# Patient Record
Sex: Female | Born: 1960 | Race: White | Hispanic: No | Marital: Married | Smoking: Current every day smoker
Health system: Southern US, Community
[De-identification: ages and names within clinical notes are randomized; demographics above are authoritative.]

## PROBLEM LIST (undated history)

## (undated) DIAGNOSIS — H8109 Meniere's disease, unspecified ear: Secondary | ICD-10-CM

## (undated) DIAGNOSIS — D735 Infarction of spleen: Secondary | ICD-10-CM

## (undated) DIAGNOSIS — I639 Cerebral infarction, unspecified: Secondary | ICD-10-CM

## (undated) DIAGNOSIS — C801 Malignant (primary) neoplasm, unspecified: Secondary | ICD-10-CM

## (undated) DIAGNOSIS — J439 Emphysema, unspecified: Secondary | ICD-10-CM

## (undated) DIAGNOSIS — R112 Nausea with vomiting, unspecified: Secondary | ICD-10-CM

## (undated) DIAGNOSIS — E785 Hyperlipidemia, unspecified: Principal | ICD-10-CM

## (undated) DIAGNOSIS — I10 Essential (primary) hypertension: Secondary | ICD-10-CM

## (undated) DIAGNOSIS — H409 Unspecified glaucoma: Secondary | ICD-10-CM

## (undated) DIAGNOSIS — Z9889 Other specified postprocedural states: Secondary | ICD-10-CM

## (undated) DIAGNOSIS — E079 Disorder of thyroid, unspecified: Secondary | ICD-10-CM

## (undated) HISTORY — DX: Emphysema, unspecified: J43.9

## (undated) HISTORY — DX: Malignant (primary) neoplasm, unspecified: C80.1

## (undated) HISTORY — DX: Unspecified glaucoma: H40.9

## (undated) HISTORY — DX: Hyperlipidemia, unspecified: E78.5

## (undated) HISTORY — DX: Infarction of spleen: D73.5

## (undated) HISTORY — DX: Disorder of thyroid, unspecified: E07.9

## (undated) HISTORY — DX: Cerebral infarction, unspecified: I63.9

## (undated) HISTORY — DX: Meniere's disease, unspecified ear: H81.09

---

## 1990-08-25 HISTORY — PX: CHOLECYSTECTOMY: SHX55

## 2014-08-25 DIAGNOSIS — C801 Malignant (primary) neoplasm, unspecified: Secondary | ICD-10-CM

## 2014-08-25 HISTORY — DX: Malignant (primary) neoplasm, unspecified: C80.1

## 2014-08-30 LAB — PROTIME-INR

## 2014-09-14 DIAGNOSIS — E669 Obesity, unspecified: Secondary | ICD-10-CM | POA: Insufficient documentation

## 2014-09-14 DIAGNOSIS — E278 Other specified disorders of adrenal gland: Secondary | ICD-10-CM | POA: Insufficient documentation

## 2014-10-10 DIAGNOSIS — R0683 Snoring: Secondary | ICD-10-CM | POA: Insufficient documentation

## 2014-10-10 DIAGNOSIS — I639 Cerebral infarction, unspecified: Secondary | ICD-10-CM | POA: Insufficient documentation

## 2014-10-10 DIAGNOSIS — H81319 Aural vertigo, unspecified ear: Secondary | ICD-10-CM | POA: Insufficient documentation

## 2014-10-10 DIAGNOSIS — D735 Infarction of spleen: Secondary | ICD-10-CM | POA: Insufficient documentation

## 2014-10-10 DIAGNOSIS — D72829 Elevated white blood cell count, unspecified: Secondary | ICD-10-CM | POA: Insufficient documentation

## 2014-10-10 DIAGNOSIS — E0590 Thyrotoxicosis, unspecified without thyrotoxic crisis or storm: Secondary | ICD-10-CM | POA: Insufficient documentation

## 2014-10-10 DIAGNOSIS — J449 Chronic obstructive pulmonary disease, unspecified: Secondary | ICD-10-CM | POA: Insufficient documentation

## 2014-10-27 DIAGNOSIS — R52 Pain, unspecified: Secondary | ICD-10-CM | POA: Insufficient documentation

## 2014-11-16 ENCOUNTER — Telehealth: Payer: Self-pay | Admitting: Hematology & Oncology

## 2014-11-16 ENCOUNTER — Encounter: Payer: Self-pay | Admitting: *Deleted

## 2014-11-16 ENCOUNTER — Encounter: Payer: Self-pay | Admitting: Hematology & Oncology

## 2014-11-16 NOTE — Telephone Encounter (Signed)
Pt aware of 3-28 appointment.She called wanting consult with Dr. Marin Olp she feels like [facility] is not sure how to treat her. I scanned some of the chart from care everywhere in this Epic.

## 2014-11-17 ENCOUNTER — Telehealth: Payer: Self-pay | Admitting: Hematology & Oncology

## 2014-11-17 NOTE — Telephone Encounter (Signed)
I spoke w NEW PATIENT today to remind them of their appointment with Dr. Ennever. Also, advised them to bring all medication bottles and insurance card information. ° °

## 2014-11-20 ENCOUNTER — Encounter: Payer: Self-pay | Admitting: Hematology & Oncology

## 2014-11-20 ENCOUNTER — Ambulatory Visit: Payer: Self-pay | Admitting: Hematology & Oncology

## 2014-11-20 ENCOUNTER — Ambulatory Visit: Payer: BLUE CROSS/BLUE SHIELD

## 2014-11-20 ENCOUNTER — Ambulatory Visit (HOSPITAL_BASED_OUTPATIENT_CLINIC_OR_DEPARTMENT_OTHER): Payer: BLUE CROSS/BLUE SHIELD | Admitting: Hematology & Oncology

## 2014-11-20 ENCOUNTER — Ambulatory Visit: Payer: Self-pay

## 2014-11-20 ENCOUNTER — Other Ambulatory Visit: Payer: Self-pay

## 2014-11-20 ENCOUNTER — Other Ambulatory Visit (HOSPITAL_BASED_OUTPATIENT_CLINIC_OR_DEPARTMENT_OTHER): Payer: BLUE CROSS/BLUE SHIELD

## 2014-11-20 VITALS — BP 135/52 | HR 101 | Temp 98.3°F | Resp 18 | Ht 62.0 in | Wt 168.0 lb

## 2014-11-20 DIAGNOSIS — C7492 Malignant neoplasm of unspecified part of left adrenal gland: Secondary | ICD-10-CM

## 2014-11-20 DIAGNOSIS — R634 Abnormal weight loss: Secondary | ICD-10-CM

## 2014-11-20 DIAGNOSIS — R109 Unspecified abdominal pain: Secondary | ICD-10-CM | POA: Diagnosis not present

## 2014-11-20 DIAGNOSIS — Z720 Tobacco use: Secondary | ICD-10-CM | POA: Diagnosis not present

## 2014-11-20 DIAGNOSIS — C7402 Malignant neoplasm of cortex of left adrenal gland: Secondary | ICD-10-CM

## 2014-11-20 LAB — CBC WITH DIFFERENTIAL (CANCER CENTER ONLY)
BASO#: 0.1 10*3/uL (ref 0.0–0.2)
BASO%: 0.4 % (ref 0.0–2.0)
EOS ABS: 0.8 10*3/uL — AB (ref 0.0–0.5)
EOS%: 2.7 % (ref 0.0–7.0)
HEMATOCRIT: 39.2 % (ref 34.8–46.6)
HEMOGLOBIN: 12.6 g/dL (ref 11.6–15.9)
LYMPH#: 4.4 10*3/uL — AB (ref 0.9–3.3)
LYMPH%: 14.3 % (ref 14.0–48.0)
MCH: 27.4 pg (ref 26.0–34.0)
MCHC: 32.1 g/dL (ref 32.0–36.0)
MCV: 85 fL (ref 81–101)
MONO#: 1.4 10*3/uL — ABNORMAL HIGH (ref 0.1–0.9)
MONO%: 4.6 % (ref 0.0–13.0)
NEUT#: 23.9 10*3/uL — ABNORMAL HIGH (ref 1.5–6.5)
NEUT%: 78 % (ref 39.6–80.0)
Platelets: 491 10*3/uL — ABNORMAL HIGH (ref 145–400)
RBC: 4.6 10*6/uL (ref 3.70–5.32)
RDW: 13.4 % (ref 11.1–15.7)
WBC: 30.7 10*3/uL — ABNORMAL HIGH (ref 3.9–10.0)

## 2014-11-20 NOTE — Progress Notes (Signed)
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 : I have a tumor on my adrenal gland and no one knows what to do about it.  HPI: Felicia Richmond is a very charming 54 year old white female. She is originally from [place]. She and her husband have been down in the [place] region for about 14 years.  She's been having worsening abdominal and left-sided back discomfort. She ultimately had scans done. Back in January, she had an MRI of the abdomen. This showed a left adrenal mass measuring 10 cm. There is also noted to be a "suspicious" right adrenal nodule measuring 1.3 cm. Everything else looked okay. There is no lymphadenopathy. Liver looked fine. Patient does smoke. She has had scans of her chest which should been negative.  She underwent a biopsy of the left adrenal mass. This was done on March 14. 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 Some stains were done. Unfortunately, the pathologist could not tell this was a primary adrenocortical carcinoma or possibly melanoma.  She does state that she had a mole taken off her right side several years ago. She does not recall if this was malignant.  She has seen oncology out at [facility]. They recommended surgery. Apparently, the surgeons were initially not sure of being able to resect out this lesion.  She has pain over on the left flank. She has some back discomfort. She is on tramadol. She cannot take anything stronger. She's had no flushing. She's had no blood pressure issues. She did have urine studies done for pheochromocytoma and these were negative.  She's had no fever. She has lost some weight. She's file all spelled 25 pounds over the past several months.  She's had no change in bowel or bladder habits. She's had no cough. She's had no shortness of breath.  She kindly came to see [place] to see  if we could help her out.  Overall, her performance status is ECOG 1.  She has never had a mammogram done.   History reviewed. No pertinent past medical history.:  History reviewed. No pertinent past surgical history.:   Current outpatient prescriptions:  .  acetaminophen (TYLENOL) 500 MG tablet, Take 500 mg by mouth every 6 (six) hours as needed., Disp: , Rfl:  .  aspirin EC 81 MG tablet, Take 81 mg by mouth., Disp: , Rfl:  .  Meclizine HCl 25 MG CHEW, Chew 25 mg by mouth., Disp: , Rfl:  .  methimazole (TAPAZOLE) 5 MG tablet, Take 5 mg by mouth., Disp: , Rfl:  .  prochlorperazine (COMPAZINE) 10 MG tablet, Take 10 mg by mouth., Disp: , Rfl:  .  traMADol (ULTRAM) 50 MG tablet, Take 50 mg by mouth., Disp: , Rfl: :  :  Allergies  Allergen Reactions  . Oxycodone Nausea And Vomiting  . Pseudoephedrine Other (See Comments)    Makes patient feel weird  . Scopolamine Other (See Comments)  . Tramadol     Other reaction(s): GI Upset (intolerance)  :  History reviewed. No pertinent family history.:  History   Social History  . Marital Status: Married    Spouse Name: N/A  . Number of Children: N/A  . Years of Education: N/A   Occupational History  . Not on file.   Social History Main Topics  . Smoking status: Current Every Day Smoker -- 1.00 packs/day for 36 years  Types: Cigarettes  . Smokeless tobacco: Never Used  . Alcohol Use: No  . Drug Use: No  . Sexual Activity: Yes    Birth Control/ Protection: None   Other Topics Concern  . Not on file   Social History Narrative  . No narrative on file  :  Pertinent items are noted in HPI.  Exam: _0 @ well-developed and well-nourished white female in no obvious distress. Vital signs show temperature of 98.3. Pulse 101. Blood pressure 135/52. Weight is 168 pounds. Head and neck exam shows no ocular or oral lesions. She has no palpable cervical or supraclavicular lymph nodes. Lungs are clear. No rales, wheezes or  rhonchi are noted. Cardiac exam regular rate and rhythm with no murmurs, rubs or bruits. Abdomen is soft. She has good bowel sounds. There is no fluid wave. There is no palpable abdominal mass. There is some slight fullness over on the left side of the abdomen. There is some slight tenderness to palpation on the left abdomen. There is no palpable hepatomegaly. I cannot palpate her spleen tip. Back exam shows no tenderness over the spine, ribs or hips. Extremities shows no clubbing, cyanosis or edema. Axillary exam shows no bilateral axillary adenopathy. Neurological exam shows no focal neurological deficits. Skin exam does show a slightly suspicious appearing hyperpigmented lesion in the upper back on the right side. This is slightly irregular. It is dark. It is flat. It probably measures about 4 x 3 mm.  Recent Labs  11/20/14 1039  WBC 30.7*  HGB 12.6  HCT 39.2  PLT 491*   No results for input(s): NA, K, CL, CO2, GLUCOSE, BUN, CREATININE, CALCIUM in the last 72 hours.  Blood smear review:  none  Pat none    Assessment : Felicia Richmond is a 54 year old white female with a large left adrenal mass. This was biopsied. Unfortunately, the pathologist could not tell this was metastatic or primary to the adrenal gland. I must say that primary adrenocortical carcinoma is very rare.  One would have to think that possibly melanoma would be more likely.  I cannot see where she's had any scans of her chest. With her smoking, a metastatic lung cancer would was be possible. However, I would think that one should be able to tell this on pathologic evaluation.  In my opinion, I think that the best option for her is to try to resect out this mass. I'm not sure what is going on with the right adrenal gland but whatever is there does not seem to be all that being. She certainly does not have any signs of adrenal insufficiency. This does not appear to be a pheochromocytoma by lab studies that she had done.  I think  by excising the left adrenal gland, there will be ample material for evaluation.  At this is melanoma, then the next chemotherapy be whether or not it is BRAF positive. If so, then I would consider treating her with one of the BRAF inhibitors.  If it is BRAF wild-type, then 1 in the immune therapies would be reasonable to consider.  I spent about 1 hour with she and her husband. I answered all their questions. She is in good shape so I think that aggressive therapy certainly would be reasonable for her. Again, I believe that surgical exploration and resection of this mass and possibly even the right adrenal lesion might be the way to go right now.  She really is in good hands with the doctors at [facility].  They are incredibly skilled and incredibly smart so I told Felicia Richmond that their recommendations would be very reasonable and again I think she already has an appointment to see a surgeon.  We will plan to get her back to see us at any time in the future.   

## 2014-11-21 LAB — COMPREHENSIVE METABOLIC PANEL
ALK PHOS: 104 U/L (ref 39–117)
ALT: <8 U/L (ref 0–35)
AST: 10 U/L (ref 0–37)
Albumin: 3.9 g/dL (ref 3.5–5.2)
BILIRUBIN TOTAL: 0.3 mg/dL (ref 0.2–1.2)
BUN: 7 mg/dL (ref 6–23)
CO2: 22 mEq/L (ref 19–32)
Calcium: 9.4 mg/dL (ref 8.4–10.5)
Chloride: 101 mEq/L (ref 96–112)
Creatinine, Ser: 0.62 mg/dL (ref 0.50–1.10)
GLUCOSE: 79 mg/dL (ref 70–99)
Potassium: 4.4 mEq/L (ref 3.5–5.3)
SODIUM: 137 meq/L (ref 135–145)
TOTAL PROTEIN: 7 g/dL (ref 6.0–8.3)

## 2014-11-21 LAB — PREALBUMIN: Prealbumin: 15 mg/dL — ABNORMAL LOW (ref 17–34)

## 2014-11-21 LAB — LACTATE DEHYDROGENASE: LDH: 232 U/L (ref 94–250)

## 2014-12-01 ENCOUNTER — Ambulatory Visit: Payer: BLUE CROSS/BLUE SHIELD | Admitting: Family

## 2015-03-01 ENCOUNTER — Telehealth: Payer: Self-pay | Admitting: Hematology & Oncology

## 2015-03-01 NOTE — Telephone Encounter (Signed)
Faxed medical records to:  [facility] CASE: [id number] F: [phone] P: [place] SCANNED

## 2015-03-02 ENCOUNTER — Encounter: Payer: Self-pay | Admitting: Internal Medicine

## 2015-03-20 ENCOUNTER — Telehealth: Payer: Self-pay | Admitting: Behavioral Health

## 2015-03-20 ENCOUNTER — Encounter: Payer: Self-pay | Admitting: Behavioral Health

## 2015-03-20 NOTE — Telephone Encounter (Signed)
Pre-Visit Call completed with patient and chart updated.   Pre-Visit Info documented in Specialty Comments under [employer].    

## 2015-03-21 ENCOUNTER — Encounter: Payer: Self-pay | Admitting: Family

## 2015-03-21 ENCOUNTER — Ambulatory Visit (INDEPENDENT_AMBULATORY_CARE_PROVIDER_SITE_OTHER): Payer: BLUE CROSS/BLUE SHIELD | Admitting: Family

## 2015-03-21 VITALS — BP 126/60 | HR 80 | Temp 98.1°F | Resp 16 | Ht 61.75 in | Wt 158.0 lb

## 2015-03-21 DIAGNOSIS — K529 Noninfective gastroenteritis and colitis, unspecified: Secondary | ICD-10-CM | POA: Diagnosis not present

## 2015-03-21 DIAGNOSIS — H1010 Acute atopic conjunctivitis, unspecified eye: Secondary | ICD-10-CM | POA: Diagnosis not present

## 2015-03-21 DIAGNOSIS — C799 Secondary malignant neoplasm of unspecified site: Secondary | ICD-10-CM

## 2015-03-21 DIAGNOSIS — E0590 Thyrotoxicosis, unspecified without thyrotoxic crisis or storm: Secondary | ICD-10-CM

## 2015-03-21 DIAGNOSIS — C439 Malignant melanoma of skin, unspecified: Secondary | ICD-10-CM

## 2015-03-21 NOTE — Progress Notes (Signed)
Pre visit review using our clinic review tool, if applicable. No additional management support is needed unless otherwise documented below in the visit note. 

## 2015-03-21 NOTE — Progress Notes (Signed)
Subjective:    Patient ID: Felicia Richmond, female    DOB: [birth year], 54 y.o.   MRN: [id number]  HPI  Ms. Felicia Richmond is a 54 yr old female who presents today to establish care.  Her pmhx is significant for left adrenal mass.   Initially began with left lumbar pain backin Sept 2015. Biopsy was positive for malignancy. She is established with Dr. Marin Olp as well as Oncology at [facility] (Dr. Nunzio Cobbs).  She ultimately underwent resection of this mass and pathology was most consistent with a metastatic melanoma. She also has skin metastasis.  She is currently undergoing chemotherapy (zelboraf) started in May.  Pmhx is also significant for Hyperthyroid (on tapazole since 2012- she sees , COPD- continues to smoke, CVA- 2015 noted on MRI of the brain incidental finding, meniere's disease, glaucoma- reports that [facility]  Her chief complaint today is diarrhea.  Reports chronic diarrhea x 2 years but has worsened since she started chemotherapy.  She is being treated with zelboraf.  She has tried lomotil without relief and is currently taking otc loperamide with relief.  [facility] that [facility]  Review of Systems  Constitutional:       Reports that she weighed 200 pounds prior to CA diagnosis.   HENT: Negative for rhinorrhea.   Respiratory: Negative for cough.   Genitourinary: Negative for dysuria and frequency.  Musculoskeletal: Negative for back pain.       Reports some joint pain in her hands  Skin: Negative for rash.  Neurological: Negative for headaches.  Hematological: Negative for adenopathy.  Psychiatric/Behavioral:       Denies depression/anxiety   Past Medical History  Diagnosis Date  . Thyroid disease   . Cancer 08/25/14    metastatic melanoma; stage IV  . Emphysema of lung   . Glaucoma   . Stroke   . Meniere's disease   . Splenic infarct     History   Social History  . Marital Status: Married    Spouse Name:  N/A  . Number of Children: N/A  . Years of Education: N/A   Occupational History  . Not on file.   Social History Main Topics  . Smoking status: Current Every Day Smoker -- 1.00 packs/day for 36 years    Types: Cigarettes  . Smokeless tobacco: Never Used  . Alcohol Use: No  . Drug Use: No  . Sexual Activity: Yes    Birth Control/ Protection: None   Other Topics Concern  . 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 Cholecystectomy  1992    Family History  Problem Relation Age of Onset  . Thyroid disease Mother   . Thyroid disease Sister   . Cancer Sister   . Heart attack Brother     Allergies  Allergen Reactions  . Oxycodone Nausea And Vomiting  . Pseudoephedrine Other (See Comments)    Makes patient feel weird  . Scopolamine Other (See Comments)  . Tramadol     Other reaction(s): GI Upset (intolerance)    Current Outpatient Prescriptions on File Prior to Visit  Medication Sig Dispense Refill  . Meclizine HCl 25 MG CHEW  Chew 25 mg by mouth as needed (dizziness).     . methimazole (TAPAZOLE) 5 MG tablet Take 5 mg by mouth daily.     . potassium chloride (K-DUR) 10 MEQ tablet Take 10 mEq by mouth daily.  0  . prochlorperazine (COMPAZINE) 10 MG tablet Take 10 mg by mouth as needed.     . traMADol (ULTRAM) 50 MG tablet Take 50 mg by mouth as needed. Pt takes 1/2 tablet as needed.    . vemurafenib (ZELBORAF) 240 MG tablet Take 960 mg by mouth 2 (two) times daily. Take with water.     No current facility-administered medications on file prior to visit.    BP 126/60 mmHg  Pulse 80  Temp(Src) 98.1 F (36.7 C) (Oral)  Resp 16  Ht 5' 1.75" (1.568 m)  Wt 158 lb (71.668 kg)  BMI 29.15 kg/m2  SpO2 98%  LMP 08/26/2007         Objective:   Physical Exam  Constitutional: She is oriented to person, place, and time. She appears well-developed and well-nourished.  HENT:  Right Ear: Tympanic membrane and ear canal normal.  Left Ear: Tympanic membrane and ear canal normal.  Mouth/Throat: No oropharyngeal exudate, posterior oropharyngeal edema or posterior oropharyngeal erythema.  Cardiovascular: Normal rate, regular rhythm and normal heart sounds.   No murmur heard. Pulmonary/Chest: Effort normal and breath sounds normal. No respiratory distress. She has no wheezes.  Neurological: She is alert and oriented to person, place, and time.  Skin: Skin is warm and dry.  Several raised skin lesions on chest, right arm  Psychiatric: She has a normal mood and affect. Her behavior is normal. Judgment and thought content normal.          Assessment & Plan:

## 2015-03-21 NOTE — Patient Instructions (Addendum)
Start claritin once a day.  Schedule a complete physical at the front desk. You will be contacted about your referral to endocrinology. Welcome to [facility]!

## 2015-03-22 DIAGNOSIS — H1010 Acute atopic conjunctivitis, unspecified eye: Secondary | ICD-10-CM | POA: Insufficient documentation

## 2015-03-22 DIAGNOSIS — C799 Secondary malignant neoplasm of unspecified site: Secondary | ICD-10-CM | POA: Insufficient documentation

## 2015-03-22 DIAGNOSIS — K529 Noninfective gastroenteritis and colitis, unspecified: Secondary | ICD-10-CM | POA: Insufficient documentation

## 2015-03-22 DIAGNOSIS — C439 Malignant melanoma of skin, unspecified: Secondary | ICD-10-CM | POA: Insufficient documentation

## 2015-03-22 NOTE — Assessment & Plan Note (Signed)
She is requesting referral to Endo in [place].  Will arrange.

## 2015-03-22 NOTE — Assessment & Plan Note (Signed)
Trial of claritin 

## 2015-03-22 NOTE — Assessment & Plan Note (Signed)
This is being managed by GI.

## 2015-03-22 NOTE — Assessment & Plan Note (Signed)
She is on chemo and is being managed by Dr. Nunzio Cobbs at [facility]

## 2015-04-12 ENCOUNTER — Telehealth: Payer: Self-pay | Admitting: Family

## 2015-04-12 NOTE — Telephone Encounter (Signed)
pre visit letter mailed 04/03/15

## 2015-04-19 ENCOUNTER — Encounter: Payer: Self-pay | Admitting: *Deleted

## 2015-04-19 ENCOUNTER — Telehealth: Payer: Self-pay | Admitting: *Deleted

## 2015-04-19 NOTE — Telephone Encounter (Signed)
Medical records received via mail from [facility]. Forwarded to [employer]. JG//CMA

## 2015-04-24 ENCOUNTER — Encounter: Payer: Self-pay | Admitting: Family

## 2015-04-24 ENCOUNTER — Other Ambulatory Visit (HOSPITAL_COMMUNITY)
Admission: RE | Admit: 2015-04-24 | Discharge: 2015-04-24 | Disposition: A | Discharge status: Home / Self Care | Payer: BLUE CROSS/BLUE SHIELD | Source: Ambulatory Visit | Attending: Family | Admitting: Family

## 2015-04-24 ENCOUNTER — Telehealth: Payer: Self-pay | Admitting: Family

## 2015-04-24 ENCOUNTER — Ambulatory Visit (INDEPENDENT_AMBULATORY_CARE_PROVIDER_SITE_OTHER): Payer: BLUE CROSS/BLUE SHIELD | Admitting: Family

## 2015-04-24 VITALS — BP 140/70 | HR 78 | Temp 98.2°F | Resp 16 | Ht 61.75 in | Wt 157.4 lb

## 2015-04-24 DIAGNOSIS — C799 Secondary malignant neoplasm of unspecified site: Secondary | ICD-10-CM

## 2015-04-24 DIAGNOSIS — Z01419 Encounter for gynecological examination (general) (routine) without abnormal findings: Secondary | ICD-10-CM | POA: Diagnosis not present

## 2015-04-24 DIAGNOSIS — Z0000 Encounter for general adult medical examination without abnormal findings: Secondary | ICD-10-CM | POA: Diagnosis not present

## 2015-04-24 DIAGNOSIS — Z1151 Encounter for screening for human papillomavirus (HPV): Secondary | ICD-10-CM | POA: Diagnosis not present

## 2015-04-24 DIAGNOSIS — C439 Malignant melanoma of skin, unspecified: Secondary | ICD-10-CM

## 2015-04-24 LAB — URINALYSIS, ROUTINE W REFLEX MICROSCOPIC
Bilirubin Urine: NEGATIVE
HGB URINE DIPSTICK: NEGATIVE
KETONES UR: NEGATIVE
Leukocytes, UA: NEGATIVE
NITRITE: NEGATIVE
RBC / HPF: NONE SEEN (ref 0–?)
TOTAL PROTEIN, URINE-UPE24: NEGATIVE
URINE GLUCOSE: NEGATIVE
UROBILINOGEN UA: 0.2 (ref 0.0–1.0)
WBC UA: NONE SEEN (ref 0–?)
pH: 6 (ref 5.0–8.0)

## 2015-04-24 LAB — LIPID PANEL
CHOLESTEROL: 284 mg/dL — AB (ref 0–200)
HDL: 53.6 mg/dL (ref >39.00)
LDL CALC: 195 mg/dL — AB (ref 0–99)
NONHDL: 230.75
Total CHOL/HDL Ratio: 5
Triglycerides: 180 mg/dL — ABNORMAL HIGH (ref 0.0–149.0)
VLDL: 36 mg/dL (ref 0.0–40.0)

## 2015-04-24 NOTE — Patient Instructions (Addendum)
Please complete lab work prior to leaving. Follow up in 6 months, sooner if problems/concerns.  

## 2015-04-24 NOTE — Telephone Encounter (Signed)
Please let pt know that I spoke with Dr. Marin Olp and he states that he is happy to have her re-establish care with him.  His office should call her to arrange an appointment.  If she doesn't hear from them in 1 week please let me know.

## 2015-04-24 NOTE — Telephone Encounter (Signed)
Notified pt. 

## 2015-04-24 NOTE — Progress Notes (Signed)
Subjective:    Patient ID: Felicia Richmond, female    DOB: 10-30-1960, 54 y.o.   MRN: [id number]  HPI  Patient presents today for complete physical.  Immunizations: up to date.  Should have flu shot next visit if OK with oncology Diet: healthy Exercise: housework only Colonoscopy: due- but told to wait per oncology Pap Smear: due Mammogram: due  Reviewed labs from [facility] 8/5 CMP noted elevated alk phos- otherwise normal. Normal H/H, mile elevatin of wbc.   Review of Systems  Constitutional: Negative for unexpected weight change.  HENT:       Occasional cough when she lays down  Respiratory: Negative for cough and shortness of breath.   Cardiovascular: Negative for chest pain.  Gastrointestinal: Negative for blood in stool.       + diarrhea- uses otc imodium which helps.   Genitourinary: Negative for dysuria and frequency.  Musculoskeletal: Negative for myalgias.       Occasional joint pain  Skin: Negative for rash.  Neurological: Negative for headaches.  Hematological: Negative for adenopathy.  Psychiatric/Behavioral:       Denies depression/anxiety   Past Medical History  Diagnosis Date  . Thyroid disease   . Cancer 08/25/14    metastatic melanoma; stage IV  . Emphysema of lung   . Glaucoma   . Stroke   . Meniere's disease   . Splenic infarct     Social History   Social History  . Marital Status: Married    Spouse Name: N/A  . Number of Children: N/A  . Years of Education: N/A   Occupational History  . Not on file.   Social History Main Topics  . Smoking status: Current Every Day Smoker -- 1.00 packs/day for 36 years    Types: Cigarettes  . Smokeless tobacco: Never Used  . Alcohol Use: No  . Drug Use: No  . Sexual Activity: Yes    Birth Control/ Protection: None   Other Topics Concern  . 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 Cholecystectomy  1992    Family History  Problem Relation Age of Onset  . Thyroid disease Mother   . Thyroid disease Sister   . Cancer Sister   . Heart attack Brother     Allergies  Allergen Reactions  . Cotellic [Cobimetinib] Diarrhea  . Oxycodone Nausea And Vomiting  . Pseudoephedrine Other (See Comments)    Makes patient feel weird  . Scopolamine Other (See Comments)  . Tramadol     Other reaction(s): GI Upset (intolerance)    Current Outpatient Prescriptions on File Prior to Visit  Medication Sig Dispense Refill  . Meclizine HCl 25 MG CHEW Chew 25 mg by mouth as needed (dizziness).     . methimazole (TAPAZOLE) 5 MG tablet Take 5 mg by mouth daily.     . potassium chloride (K-DUR) 10 MEQ tablet Take 10 mEq by mouth daily.  0  . prochlorperazine (COMPAZINE) 10 MG tablet Take 10 mg by mouth as needed.     . traMADol (ULTRAM) 50 MG tablet Take 50 mg by mouth as needed. Pt takes 1/2 tablet as needed.    . vemurafenib (ZELBORAF) 240 MG tablet Take 960 mg by  mouth 2 (two) times daily. Take with water.     No current facility-administered medications on file prior to visit.    BP 140/70 mmHg  Pulse 78  Temp(Src) 98.2 F (36.8 C) (Oral)  Resp 16  Ht 5' 1.75" (1.568 m)  Wt 157 lb 6.4 oz (71.396 kg)  BMI 29.04 kg/m2  SpO2 96%  LMP 08/26/2007       Objective:   Physical Exam Physical Exam  Constitutional: She is oriented to person, place, and time. She appears well-developed and well-nourished. No distress.  HENT:  Head: Normocephalic and atraumatic.  Right Ear: Tympanic membrane and ear canal normal.  Left Ear: Tympanic membrane and ear canal normal.  Mouth/Throat: Oropharynx is clear and moist.  Eyes: Pupils are equal, round, and reactive to light. No scleral icterus.  Neck: Normal range of motion. No thyromegaly present.  Cardiovascular:  Normal rate and regular rhythm.   No murmur heard. Pulmonary/Chest: Effort normal and breath sounds normal. No respiratory distress. He has no wheezes. She has no rales. She exhibits no tenderness.  Abdominal: Soft. Bowel sounds are normal. He exhibits no distension and no mass. There is no tenderness. There is no rebound and no guarding.  Musculoskeletal: She exhibits no edema.  Lymphadenopathy:    She has no cervical adenopathy.  Neurological: She is alert and oriented to person, place, and time. She has normal patellar reflexes. She exhibits normal muscle tone. Coordination normal.  Skin: Skin is warm and dry. multiple skin lesions that are firm and nodular- suspect mets Psychiatric: She has a normal mood and affect. Her behavior is normal. Judgment and thought content normal.  Breasts: Examined lying Right: Without masses, retractions, discharge or axillary adenopathy.  Left: Without masses, retractions, discharge or axillary adenopathy.  Inguinal/mons: Normal without inguinal adenopathy  External genitalia: Normal  BUS/Urethra/Skene's glands: Normal  Bladder: Normal  Vagina: Normal, left labia majora- smooth skin lesion (?met) Cervix: Normal  Uterus: normal in size, shape and contour. Midline and mobile  Adnexa/parametria:  Rt: Without masses or tenderness.  Lt: Without masses or tenderness.  Anus and perineum: Normal           Assessment & Plan:          Assessment & Plan:  EKG tracing is personally reviewed.  EKG notes NSR.  No acute changes.

## 2015-04-24 NOTE — Progress Notes (Signed)
Pre visit review using our clinic review tool, if applicable. No additional management support is needed unless otherwise documented below in the visit note. 

## 2015-04-25 ENCOUNTER — Other Ambulatory Visit: Payer: Self-pay | Admitting: Hematology & Oncology

## 2015-04-25 DIAGNOSIS — C799 Secondary malignant neoplasm of unspecified site: Secondary | ICD-10-CM

## 2015-04-25 DIAGNOSIS — C439 Malignant melanoma of skin, unspecified: Secondary | ICD-10-CM

## 2015-04-26 ENCOUNTER — Encounter: Payer: Self-pay | Admitting: Hematology & Oncology

## 2015-04-26 ENCOUNTER — Encounter: Payer: Self-pay | Admitting: Family

## 2015-04-26 ENCOUNTER — Ambulatory Visit (HOSPITAL_BASED_OUTPATIENT_CLINIC_OR_DEPARTMENT_OTHER): Payer: BLUE CROSS/BLUE SHIELD | Admitting: Hematology & Oncology

## 2015-04-26 ENCOUNTER — Other Ambulatory Visit: Payer: Self-pay | Admitting: Family

## 2015-04-26 VITALS — BP 150/66 | HR 96 | Temp 97.6°F | Resp 16 | Ht 61.0 in | Wt 156.0 lb

## 2015-04-26 DIAGNOSIS — C7970 Secondary malignant neoplasm of unspecified adrenal gland: Secondary | ICD-10-CM | POA: Diagnosis not present

## 2015-04-26 DIAGNOSIS — E785 Hyperlipidemia, unspecified: Secondary | ICD-10-CM

## 2015-04-26 DIAGNOSIS — Z720 Tobacco use: Secondary | ICD-10-CM

## 2015-04-26 DIAGNOSIS — C439 Malignant melanoma of skin, unspecified: Secondary | ICD-10-CM

## 2015-04-26 DIAGNOSIS — C799 Secondary malignant neoplasm of unspecified site: Secondary | ICD-10-CM

## 2015-04-26 DIAGNOSIS — Z0000 Encounter for general adult medical examination without abnormal findings: Secondary | ICD-10-CM | POA: Insufficient documentation

## 2015-04-26 HISTORY — DX: Hyperlipidemia, unspecified: E78.5

## 2015-04-26 LAB — CYTOLOGY - PAP

## 2015-04-26 NOTE — Assessment & Plan Note (Signed)
She is unhappy with her care at [facility] and wishes to return to see Dr. Marin Olp to complete her treatment.  I have reached out to Dr. Marin Olp and his office with contact the patient to arrange follow up.

## 2015-04-26 NOTE — Telephone Encounter (Signed)
Please contact pt and let her know that her cholesterol is extremely high. I would recommend that she start atorvastatin 40mg  once daily. Repeat flp in 6 weeks dx hyperlipidemia.  Also work on low fat/low cholesterol diet, exercise and weight loss.

## 2015-04-26 NOTE — Progress Notes (Signed)
Hematology and Oncology Follow Up Visit  Shaelyn Decarli [id number] 10-19-1960 54 y.o. 04/26/2015   Principle Diagnosis:   Metastatic melanoma- BRAF (+)  Current Therapy:    ZELBORAF 720 mg by mouth twice a day     Interim History:  Ms. Cleaver is back for follow-up. We first saw her back in March. At that point time, she had a large adrenal mass. This was of the left adrenal gland. She ultimately underwent a biopsy. The biopsy was positive for melanoma. More importantly, was fact that the tumor was BRAF positive.  She was seen at [facility]. She was started on treatment with ZELBORAFand cobimetinib. She unfortunately cannot tolerate the cobimetinib due to diarrhea.  She has done well with ZELBORAF. She recently had scans done. It showed that she had a nice decrease in her adrenal mass.however, there was noted to be a new left adrenal lymph node. She had a new right adrenal mass measuring 3 cm. There were some enlarging soft tissue nodules.  From what she told me, she said that she was told everything was doing well.  She does not have any pain issues. She's had no abdominal problems. There is no diarrhea.  She does have skin lesions from the [facility] and probably needs to see a dermatologist to have these removed.  She's not noted any problems with fever. She's had no bleeding. She's had no cough.  She is still smoking. She is still smoking about a pack per day.  She's had no mouth sores. She's had no headache. There's been no leg swelling.  Overall, her performance status is ECOG 1.           Medications:  Current outpatient prescriptions:  Marland Kitchen  Meclizine HCl 25 MG CHEW, Chew 25 mg by mouth as needed (dizziness). , Disp: , Rfl:  .  methimazole (TAPAZOLE) 5 MG tablet, Take 5 mg by mouth daily. , Disp: , Rfl:  .  potassium chloride (K-DUR) 10 MEQ tablet, Take 10 mEq by mouth daily., Disp: , Rfl: 0 .  prochlorperazine (COMPAZINE) 10 MG tablet, Take 10 mg by mouth as  needed. , Disp: , Rfl:  .  traMADol (ULTRAM) 50 MG tablet, Take 50 mg by mouth as needed. Pt takes 1/2 tablet as needed., Disp: , Rfl:  .  vemurafenib (ZELBORAF) 240 MG tablet, Take 960 mg by mouth 2 (two) times daily. Take with water., Disp: , Rfl:  .  Loperamide HCl (RA ANTI-DIARRHEAL PO), Take by mouth. Take as needed for diarrhea, Disp: , Rfl:   Allergies:  Allergies  Allergen Reactions  . Cotellic [Cobimetinib] Diarrhea  . Oxycodone Nausea And Vomiting  . Pseudoephedrine Other (See Comments)    Makes patient feel weird  . Scopolamine Other (See Comments)  . Tramadol     Other reaction(s): GI Upset (intolerance)    Past Medical History, Surgical history, Social history, and Family History were reviewed and updated.  Review of Systems: As above  Physical Exam:  height is 5' 1"  (1.549 m) and weight is 156 lb (70.761 kg). Her oral temperature is 97.6 F (36.4 C). Her blood pressure is 150/66 and her pulse is 96. Her respiration is 16.   Wt Readings from Last 3 Encounters:  04/26/15 156 lb (70.761 kg)  04/24/15 157 lb 6.4 oz (71.396 kg)  03/21/15 158 lb (71.668 kg)     Well-developed and well-nourished white female in no obvious distress. Head and neck exam shows no ocular or oral lesions. There are  no palpable cervical or supraclavicular lymph nodes. Lungs are clear. Cardiac exam regular rate and rhythm with no murmurs, rubs or bruits. Axillary exam shows no bilateral axillary adenopathy. Abdomen is soft. She has good bowel sounds. There is no fluid wave. There is no palpable liver or spleen tip. Back exam shows no tenderness over the spine, ribs or hips. Extremities shows no clubbing, cyanosis or edema. Neurological exam shows no focal neurological deficits. Skin exam shows no rashes, ecchymoses or petechia.  Lab Results  Component Value Date   WBC 30.7* 11/20/2014   HGB 12.6 11/20/2014   HCT 39.2 11/20/2014   MCV 85 11/20/2014   PLT 491* 11/20/2014     Chemistry        Component Value Date/Time   NA 137 11/20/2014 1039   K 4.4 11/20/2014 1039   CL 101 11/20/2014 1039   CO2 22 11/20/2014 1039   BUN 7 11/20/2014 1039   CREATININE 0.62 11/20/2014 1039      Component Value Date/Time   CALCIUM 9.4 11/20/2014 1039   ALKPHOS 104 11/20/2014 1039   AST 10 11/20/2014 1039   ALT <8 11/20/2014 1039   BILITOT 0.3 11/20/2014 1039         Impression and Plan: Ms. Clinch is 54 year old white female with metastatic melanoma. I would have to think that she is responding. She's been on treatment for about 3 months.  I am not sure what the CT scan really is showing. It is possible that the lesions are being seen are from the [facility] and not malignant.  She said that she is due for another scan in 3 months.  We will have to get her to be seen by a dermatologist. She has these skin lesions which look like keratoacanthomas that need to be removed.  Her overall performance status looks really good.  I want to see her back in another month or so. We will have to be very cautious and if she starts having any, symptoms, rescan her so we can look for progressive disease.  If she progresses, then I would certainly continue her on therapy and would use immunotherapy with pembrolizumab  I spent about 45 minutes with she and her husband. It was nice to see them again.  Volanda Napoleon, MD 9/1/20164:51 PM

## 2015-04-26 NOTE — Assessment & Plan Note (Signed)
Discussed healthy diet/exericse. Will hold off on mammogram and colo given her hx of stage IV metastatic melanoma.  Unfortunately, I don't think that she will benefit from these screening tests as they are unlikely to increase her life expectancy.  We did do a Pap today though.

## 2015-04-27 ENCOUNTER — Telehealth: Payer: Self-pay | Admitting: Hematology & Oncology

## 2015-04-27 ENCOUNTER — Other Ambulatory Visit (HOSPITAL_BASED_OUTPATIENT_CLINIC_OR_DEPARTMENT_OTHER): Payer: BLUE CROSS/BLUE SHIELD

## 2015-04-27 DIAGNOSIS — C439 Malignant melanoma of skin, unspecified: Secondary | ICD-10-CM

## 2015-04-27 DIAGNOSIS — C799 Secondary malignant neoplasm of unspecified site: Secondary | ICD-10-CM

## 2015-04-27 LAB — COMPREHENSIVE METABOLIC PANEL
ALK PHOS: 168 U/L — AB (ref 33–130)
ALT: 12 U/L (ref 6–29)
AST: 15 U/L (ref 10–35)
Albumin: 4.4 g/dL (ref 3.6–5.1)
BILIRUBIN TOTAL: 0.7 mg/dL (ref 0.2–1.2)
BUN: 9 mg/dL (ref 7–25)
CO2: 27 mmol/L (ref 20–31)
CREATININE: 0.86 mg/dL (ref 0.50–1.05)
Calcium: 9.5 mg/dL (ref 8.6–10.4)
Chloride: 102 mmol/L (ref 98–110)
GLUCOSE: 165 mg/dL — AB (ref 65–99)
POTASSIUM: 4.4 mmol/L (ref 3.5–5.3)
SODIUM: 139 mmol/L (ref 135–146)
TOTAL PROTEIN: 7.2 g/dL (ref 6.1–8.1)

## 2015-04-27 LAB — CBC WITH DIFFERENTIAL (CANCER CENTER ONLY)
BASO#: 0 10*3/uL (ref 0.0–0.2)
BASO%: 0.3 % (ref 0.0–2.0)
EOS%: 4.2 % (ref 0.0–7.0)
Eosinophils Absolute: 0.5 10*3/uL (ref 0.0–0.5)
HCT: 45.2 % (ref 34.8–46.6)
HGB: 15 g/dL (ref 11.6–15.9)
LYMPH#: 3.1 10*3/uL (ref 0.9–3.3)
LYMPH%: 24.1 % (ref 14.0–48.0)
MCH: 28.6 pg (ref 26.0–34.0)
MCHC: 33.2 g/dL (ref 32.0–36.0)
MCV: 86 fL (ref 81–101)
MONO#: 0.8 10*3/uL (ref 0.1–0.9)
MONO%: 5.9 % (ref 0.0–13.0)
NEUT#: 8.3 10*3/uL — ABNORMAL HIGH (ref 1.5–6.5)
NEUT%: 65.5 % (ref 39.6–80.0)
PLATELETS: 301 10*3/uL (ref 145–400)
RBC: 5.24 10*6/uL (ref 3.70–5.32)
RDW: 14.7 % (ref 11.1–15.7)
WBC: 12.7 10*3/uL — AB (ref 3.9–10.0)

## 2015-04-27 LAB — LACTATE DEHYDROGENASE: LDH: 213 U/L (ref 94–250)

## 2015-04-27 MED ORDER — ATORVASTATIN CALCIUM 40 MG PO TABS: 40.0000 mg | ORAL_TABLET | Freq: Every day | ORAL | Status: DC

## 2015-04-27 NOTE — Telephone Encounter (Signed)
Lt mess for pt to call to get scheduled for a lab today or 9/6.

## 2015-04-27 NOTE — Telephone Encounter (Signed)
Pt requested low fat diet. Mailed general info diet from up to date.

## 2015-04-27 NOTE — Telephone Encounter (Signed)
Notified pt and she voices understanding. Lab appt scheduled for 06/08/15 and future lab order entered.

## 2015-05-03 ENCOUNTER — Encounter: Payer: Self-pay | Admitting: Internal Medicine

## 2015-05-03 ENCOUNTER — Ambulatory Visit (INDEPENDENT_AMBULATORY_CARE_PROVIDER_SITE_OTHER): Payer: BLUE CROSS/BLUE SHIELD | Admitting: Internal Medicine

## 2015-05-03 VITALS — BP 132/64 | HR 92 | Temp 98.0°F | Resp 12 | Ht 62.0 in | Wt 158.8 lb

## 2015-05-03 DIAGNOSIS — E0590 Thyrotoxicosis, unspecified without thyrotoxic crisis or storm: Secondary | ICD-10-CM | POA: Diagnosis not present

## 2015-05-03 NOTE — Patient Instructions (Signed)
Please continue Methimazole 5 mg daily.  Please stop at the lab.  Please come back for a follow-up appointment in 3 months.

## 2015-05-03 NOTE — Progress Notes (Signed)
Patient ID: Felicia Richmond, female   DOB: 29-Nov-1960, 54 y.o.   MRN: [id number]   HPI  Felicia Richmond is a 54 y.o.-year-old female, referred by her PCP, Nance Pear., NP, for evaluation for thyrotoxicosis.  She had hyperthyroidism for 4 years, but was aware of the dx 2 years ago >> started MMI 5 mg daily in am, which she continues today. She remembers having an Uptake and scan this year. No recent TFTs checked, per her report.  I do not have any thyroid tests available for review >> will ask for records from dr Ladonna Snide from [facility]) - endo.   Pt denies feeling nodules in neck, hoarseness, dysphagia/odynophagia, SOB with lying down; she c/o: - + fatigue - + heat intolerance, + hot flushes - no tremors - no anxiety - no palpitations - + diarrhea - + weight loss (40-45 lbs in last year) - + hair thinning  Pt does have a FH of thyroid ds: mother and sister. No FH of thyroid cancer. No h/o radiation tx to head or neck.  No seaweed or kelp, + recent contrast studies (03/30/2015). No steroid use. No herbal supplements. No Biotin use.  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 She also has HTN.  ROS: Constitutional: + see HPI Eyes: + blurry vision, no xerophthalmia ENT: no sore throat, + nodules palpated in throat, no dysphagia/odynophagia, no hoarseness, + tinnitus (Meniere Ds) Cardiovascular: no CP/SOB/palpitations/leg swelling Respiratory: no cough/SOB Gastrointestinal: no N/V/D/C Musculoskeletal: no muscle/+ joint aches Skin: no rashes Neurological: no tremors/numbness/tingling/dizziness, + occas. HAs Psychiatric: no depression/anxiety  Past Medical History  Diagnosis Date  . Thyroid disease   . Cancer 08/25/14    metastatic melanoma; stage IV  . Emphysema of lung   . Glaucoma   . Stroke   . Meniere's disease   . Splenic infarct   . Hyperlipidemia 04/26/2015   Past Surgical History  Procedure  Laterality Date  . Cholecystectomy  1992   Social History   Social History  . Marital Status: Married    Spouse Name: N/A  . Number of Children: 1   Occupational History  . homemaker.   Social History Main Topics  . Smoking status: Current Every Day Smoker -- 1.00 packs/day for 36 years    Types: Cigarettes  . Smokeless tobacco: Never Used  . Alcohol Use: No  . 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 atorvastatin (LIPITOR) 40 MG tablet Take 1 tablet (40 mg total) by mouth daily. 30 tablet 3  . Loperamide HCl (RA ANTI-DIARRHEAL PO) Take by mouth. Take as needed for diarrhea    . Meclizine HCl 25 MG CHEW Chew 25 mg by mouth as needed (dizziness).     . methimazole (TAPAZOLE) 5 MG tablet Take 5 mg by mouth daily.     . potassium chloride (K-DUR) 10 MEQ tablet Take 10 mEq by mouth daily.  0  . prochlorperazine (COMPAZINE) 10 MG tablet Take 10 mg by mouth as needed.     . traMADol (ULTRAM) 50 MG tablet Take 50 mg by mouth as needed. Pt takes 1/2 tablet as  needed.    . vemurafenib (ZELBORAF) 240 MG tablet Take 960 mg by mouth 2 (two) times daily. Take with water.     No current facility-administered medications on file prior to visit.   Allergies  Allergen Reactions  . Cotellic [Cobimetinib] Diarrhea  . Oxycodone Nausea And Vomiting  . Pseudoephedrine Other (See Comments)    Makes patient feel weird  . Scopolamine Other (See Comments)  . Tramadol     Other reaction(s): GI Upset (intolerance)   Family History  Problem Relation Age of Onset  . Thyroid disease Mother   . Thyroid disease Sister   . Cancer Sister   . Heart attack Brother    PE: BP 132/64 mmHg  Pulse 92  Temp(Src) 98 F (36.7 C)  (Oral)  Resp 12  Ht 5\' 2"  (1.575 m)  Wt 158 lb 12.8 oz (72.031 kg)  BMI 29.04 kg/m2  SpO2 95%  LMP 08/26/2007 Wt Readings from Last 3 Encounters:  05/03/15 158 lb 12.8 oz (72.031 kg)  04/26/15 156 lb (70.761 kg)  04/24/15 157 lb 6.4 oz (71.396 kg)   Constitutional: overweight, in NAD Eyes: PERRLA, EOMI, no exophthalmos, no lid lag, no stare ENT: moist mucous membranes, no thyromegaly, no thyroid bruits, + large, ~2 cm firm R thyroid nodule, freely movable with deglutition, no cervical lymphadenopathy Cardiovascular: RRR, No MRG Respiratory: CTA B Gastrointestinal: abdomen soft, NT, ND, BS+ Musculoskeletal: no deformities, strength intact in all 4 Skin: moist, warm, no rashes Neurological: no tremor with outstretched hands, DTR normal in all 4  ASSESSMENT: 1. Hyperthyroidism  PLAN:  1. Patient with a h/o hyperthyroidism, without clear thyrotoxic sxs except diarrhea and weight loss >> however, these may be from her Melanoma tx. She also has hot flushes (menopausal?). - she does not appear to have exogenous causes for the low TSH. Zelboraf can influence the TFTs but this was started after she was dx with hyperthyroidism. - We discussed that possible causes of thyrotoxicosis are:  Graves ds  Thyroiditis (doubt this due to long duration of ds.) toxic multinodular goiter/ toxic adenoma (I can feel a large R nodule at palpation of her thyroid). - I suggested that we check the TSH, fT3 and fT4 today  - I will also need to get records from Dr Ladonna Snide to see what studies have been done already >> I will let pt know if we need additional tests when I receive the records - pt signed a release of info form - we discussed about possible modalities of treatment for the above conditions, to include methimazole use, radioactive iodine ablation or (last resort) surgery. - I do not feel that we need to add beta blockers at this time, since she is not tachycardic (pulse 92 at the beginning of appt, but  in the 80s at the end of the appt), not anxious, or tremulous - RTC in 3 months, but likely sooner for repeat labs  CC: Dr Nunzio Cobbs [facility] Heme/Onc  Component     Latest Ref Rng 05/04/2015  TSH     0.35 - 4.50 uIU/mL 1.85  Free T4     0.60 - 1.60 ng/dL 0.85  T3, Free     2.3 - 4.2 pg/mL 3.5   TFTs normal. For now, cont MMI 5 mg daily until I get records from previous endo.

## 2015-05-04 ENCOUNTER — Other Ambulatory Visit (INDEPENDENT_AMBULATORY_CARE_PROVIDER_SITE_OTHER): Payer: BLUE CROSS/BLUE SHIELD

## 2015-05-04 DIAGNOSIS — E0590 Thyrotoxicosis, unspecified without thyrotoxic crisis or storm: Secondary | ICD-10-CM

## 2015-05-04 LAB — TSH: TSH: 1.85 u[IU]/mL (ref 0.35–4.50)

## 2015-05-04 LAB — T3, FREE: T3 FREE: 3.5 pg/mL (ref 2.3–4.2)

## 2015-05-04 LAB — T4, FREE: Free T4: 0.85 ng/dL (ref 0.60–1.60)

## 2015-05-07 ENCOUNTER — Ambulatory Visit: Payer: BLUE CROSS/BLUE SHIELD | Admitting: Internal Medicine

## 2015-05-18 ENCOUNTER — Telehealth: Payer: Self-pay | Admitting: Family

## 2015-05-18 DIAGNOSIS — E041 Nontoxic single thyroid nodule: Secondary | ICD-10-CM

## 2015-05-18 NOTE — Telephone Encounter (Signed)
Please contact pt and let her know that I reviewed her old records. Note on CT was made of some thyroid nodules.  I would like her to complete a thyroid US while we wait for her to get in with Dr. Cruzita Lederer. I have pended the order below.

## 2015-05-18 NOTE — Telephone Encounter (Signed)
Pt agrees with having US done.  Thyroid US ordered.

## 2015-05-22 ENCOUNTER — Ambulatory Visit (HOSPITAL_BASED_OUTPATIENT_CLINIC_OR_DEPARTMENT_OTHER)
Admission: RE | Admit: 2015-05-22 | Discharge: 2015-05-22 | Disposition: A | Discharge status: Home / Self Care | Payer: BLUE CROSS/BLUE SHIELD | Source: Ambulatory Visit | Attending: Family | Admitting: Family

## 2015-05-22 DIAGNOSIS — E042 Nontoxic multinodular goiter: Secondary | ICD-10-CM | POA: Insufficient documentation

## 2015-05-22 DIAGNOSIS — E0590 Thyrotoxicosis, unspecified without thyrotoxic crisis or storm: Secondary | ICD-10-CM | POA: Diagnosis not present

## 2015-05-22 DIAGNOSIS — E041 Nontoxic single thyroid nodule: Secondary | ICD-10-CM

## 2015-06-01 ENCOUNTER — Ambulatory Visit (HOSPITAL_BASED_OUTPATIENT_CLINIC_OR_DEPARTMENT_OTHER): Payer: BLUE CROSS/BLUE SHIELD | Admitting: Hematology & Oncology

## 2015-06-01 ENCOUNTER — Encounter: Payer: Self-pay | Admitting: Hematology & Oncology

## 2015-06-01 ENCOUNTER — Other Ambulatory Visit (HOSPITAL_BASED_OUTPATIENT_CLINIC_OR_DEPARTMENT_OTHER): Payer: BLUE CROSS/BLUE SHIELD

## 2015-06-01 VITALS — BP 139/52 | HR 82 | Temp 97.4°F | Resp 14 | Ht 62.0 in | Wt 153.0 lb

## 2015-06-01 DIAGNOSIS — L989 Disorder of the skin and subcutaneous tissue, unspecified: Secondary | ICD-10-CM | POA: Diagnosis not present

## 2015-06-01 DIAGNOSIS — C799 Secondary malignant neoplasm of unspecified site: Secondary | ICD-10-CM

## 2015-06-01 DIAGNOSIS — C439 Malignant melanoma of skin, unspecified: Secondary | ICD-10-CM

## 2015-06-01 LAB — CBC WITH DIFFERENTIAL (CANCER CENTER ONLY)
BASO#: 0.1 10*3/uL (ref 0.0–0.2)
BASO%: 0.6 % (ref 0.0–2.0)
EOS ABS: 0.6 10*3/uL — AB (ref 0.0–0.5)
EOS%: 4 % (ref 0.0–7.0)
HEMATOCRIT: 43.9 % (ref 34.8–46.6)
HEMOGLOBIN: 14.8 g/dL (ref 11.6–15.9)
LYMPH#: 3.8 10*3/uL — AB (ref 0.9–3.3)
LYMPH%: 27.6 % (ref 14.0–48.0)
MCH: 29.5 pg (ref 26.0–34.0)
MCHC: 33.7 g/dL (ref 32.0–36.0)
MCV: 88 fL (ref 81–101)
MONO#: 1.1 10*3/uL — AB (ref 0.1–0.9)
MONO%: 7.7 % (ref 0.0–13.0)
NEUT%: 60.1 % (ref 39.6–80.0)
NEUTROS ABS: 8.3 10*3/uL — AB (ref 1.5–6.5)
Platelets: 356 10*3/uL (ref 145–400)
RBC: 5.02 10*6/uL (ref 3.70–5.32)
RDW: 13.1 % (ref 11.1–15.7)
WBC: 13.9 10*3/uL — AB (ref 3.9–10.0)

## 2015-06-01 LAB — CMP (CANCER CENTER ONLY)
ALBUMIN: 4.1 g/dL (ref 3.3–5.5)
ALK PHOS: 152 U/L — AB (ref 26–84)
ALT: 15 U/L (ref 10–47)
AST: 21 U/L (ref 11–38)
BUN: 9 mg/dL (ref 7–22)
CO2: 26 mEq/L (ref 18–33)
Calcium: 9.8 mg/dL (ref 8.0–10.3)
Chloride: 102 mEq/L (ref 98–108)
Creat: 1.1 mg/dl (ref 0.6–1.2)
Glucose, Bld: 95 mg/dL (ref 73–118)
POTASSIUM: 3.9 meq/L (ref 3.3–4.7)
Sodium: 135 mEq/L (ref 128–145)
TOTAL PROTEIN: 8 g/dL (ref 6.4–8.1)
Total Bilirubin: 0.8 mg/dl (ref 0.20–1.60)

## 2015-06-01 LAB — LACTATE DEHYDROGENASE: LDH: 254 U/L — AB (ref 94–250)

## 2015-06-01 NOTE — Progress Notes (Signed)
Hematology and Oncology Follow Up Visit  Cortnie Ringel [id number] 06/09/1961 54 y.o. 06/01/2015   Principle Diagnosis:   Metastatic melanoma- BRAF (+)  Current Therapy:    ZELBORAF 720 mg by mouth twice a day     Interim History:  Ms. Fullam is back for follow-up. We first saw her back in March. At that point time, she had a large adrenal mass. This was of the left adrenal gland. She ultimately underwent a biopsy. The biopsy was positive for melanoma. More importantly, was fact that the tumor was BRAF positive.  She was seen at [facility]. She was started on treatment with ZELBORAFand cobimetinib. She unfortunately cannot tolerate the cobimetinib due to diarrhea.  She has done well with ZELBORAF. She recently had scans done. It showed that she had a nice decrease in her adrenal mass.however, there was noted to be a new left adrenal lymph node. She had a new right adrenal mass measuring 3 cm. There were some enlarging soft tissue nodules.  She feels pre-well. She is tolerating the Zelboraf okay.  She did see another dermatologist. She has some biopsies done area and she had squamous cells and nothing that appeared malignant.  However, she was found to have a subcutaneous mass under her right breast. She is also noted some other subcutaneous nodules. Her dermatologist biopsied this mass under her breast.  She is eating well. She is had no nausea or vomiting. She's had no change in bowel or bladder habits. She's had no bleeding. She's had no fever. She's had no leg swelling.    Overall, her performance status is ECOG 1.           Medications:  Current outpatient prescriptions:  .  atorvastatin (LIPITOR) 40 MG tablet, Take 1 tablet (40 mg total) by mouth daily., Disp: 30 tablet, Rfl: 3 .  Loperamide HCl (RA ANTI-DIARRHEAL PO), Take by mouth. Take as needed for diarrhea, Disp: , Rfl:  .  Meclizine HCl 25 MG CHEW, Chew 25 mg by mouth as needed (dizziness). , Disp: , Rfl:  .   methimazole (TAPAZOLE) 5 MG tablet, Take 5 mg by mouth daily. , Disp: , Rfl:  .  OVER THE COUNTER MEDICATION, Take 2 mg by mouth daily. [facility] - Anti-diarrheal, Disp: , Rfl:  .  potassium chloride (K-DUR) 10 MEQ tablet, Take 10 mEq by mouth daily., Disp: , Rfl: 0 .  prochlorperazine (COMPAZINE) 10 MG tablet, Take 10 mg by mouth as needed. , Disp: , Rfl:  .  traMADol (ULTRAM) 50 MG tablet, Take 50 mg by mouth as needed. Pt takes 1/2 tablet as needed., Disp: , Rfl:  .  vemurafenib (ZELBORAF) 240 MG tablet, Take 960 mg by mouth 2 (two) times daily. Take with water., Disp: , Rfl:   Allergies:  Allergies  Allergen Reactions  . Cotellic [Cobimetinib] Diarrhea  . Oxycodone Nausea And Vomiting  . Pseudoephedrine Other (See Comments)    Makes patient feel weird  . Scopolamine Other (See Comments)  . Tramadol     Other reaction(s): GI Upset (intolerance)    Past Medical History, Surgical history, Social history, and Family History were reviewed and updated.  Review of Systems: As above  Physical Exam:  height is 5' 2"  (1.575 m) and weight is 153 lb (69.4 kg). Her oral temperature is 97.4 F (36.3 C). Her blood pressure is 139/52 and her pulse is 82. Her respiration is 14.   Wt Readings from Last 3 Encounters:  06/01/15 153 lb (  69.4 kg)  05/03/15 158 lb 12.8 oz (72.031 kg)  04/26/15 156 lb (70.761 kg)     Well-developed and well-nourished white female in no obvious distress. Head and neck exam shows no ocular or oral lesions. There are no palpable cervical or supraclavicular lymph nodes. Lungs are clear. Cardiac exam regular rate and rhythm with no murmurs, rubs or bruits. Axillary exam shows no bilateral axillary adenopathy. Abdomen is soft. She has good bowel sounds. There is no fluid wave. There is no palpable liver or spleen tip. Back exam shows no tenderness over the spine, ribs or hips. Extremities shows no clubbing, cyanosis or edema. Neurological exam shows no focal neurological  deficits. Skin exam shows no rashes, ecchymoses or petechia.  Lab Results  Component Value Date   WBC 13.9* 06/01/2015   HGB 14.8 06/01/2015   HCT 43.9 06/01/2015   MCV 88 06/01/2015   PLT 356 06/01/2015     Chemistry      Component Value Date/Time   NA 135 06/01/2015 1153   NA 139 04/27/2015 1134   K 3.9 06/01/2015 1153   K 4.4 04/27/2015 1134   CL 102 06/01/2015 1153   CL 102 04/27/2015 1134   CO2 26 06/01/2015 1153   CO2 27 04/27/2015 1134   BUN 9 06/01/2015 1153   BUN 9 04/27/2015 1134   CREATININE 1.1 06/01/2015 1153   CREATININE 0.86 04/27/2015 1134      Component Value Date/Time   CALCIUM 9.8 06/01/2015 1153   CALCIUM 9.5 04/27/2015 1134   ALKPHOS 152* 06/01/2015 1153   ALKPHOS 168* 04/27/2015 1134   AST 21 06/01/2015 1153   AST 15 04/27/2015 1134   ALT 15 06/01/2015 1153   ALT 12 04/27/2015 1134   BILITOT 0.80 06/01/2015 1153   BILITOT 0.7 04/27/2015 1134         Impression and Plan: Ms. Paullin is 54 year old white female with metastatic melanoma.   I am clearly concerned about these subcutaneous nodules in this mass under the right breast being melanoma. If so, we will have to get her on immunotherapy. I probably wouldn't have her on Keytruda. I also would have her scans repeated.  I talked to she and her husband. I spent a good 40 minutes with them. I explained to him my concern. I told him how I would treat her if she had progressive disease. They understand.  They still see the surgeon at [facility].  She is supposed to have another CT scan the end of October or in November. Again, we will move this up if necessary.  I will plan to see her back in another 6 weeks but definitely I will get her in sooner if we find that she has metastatic disease that is progressive.   Volanda Napoleon, MD 10/7/20165:19 PM

## 2015-06-04 ENCOUNTER — Telehealth: Payer: Self-pay | Admitting: Family

## 2015-06-04 MED ORDER — POTASSIUM CHLORIDE ER 10 MEQ PO TBCR: 10.0000 meq | EXTENDED_RELEASE_TABLET | Freq: Every day | ORAL | Status: DC

## 2015-06-04 NOTE — Telephone Encounter (Signed)
Caller name: Daelyn Pettaway   Relationship to patient: Self   Can be reached: [phone]  Pharmacy: [facility] [place] - [address]  Reason for call: pt is requesting a refill on potassium chloride . She says that she is almost out.

## 2015-06-04 NOTE — Telephone Encounter (Signed)
Refills sent, notified pt. 

## 2015-06-07 ENCOUNTER — Other Ambulatory Visit: Payer: Self-pay | Admitting: Hematology & Oncology

## 2015-06-07 DIAGNOSIS — C439 Malignant melanoma of skin, unspecified: Secondary | ICD-10-CM

## 2015-06-07 DIAGNOSIS — C799 Secondary malignant neoplasm of unspecified site: Secondary | ICD-10-CM

## 2015-06-08 ENCOUNTER — Encounter: Payer: Self-pay | Admitting: Family

## 2015-06-08 ENCOUNTER — Other Ambulatory Visit: Payer: BLUE CROSS/BLUE SHIELD

## 2015-06-08 ENCOUNTER — Ambulatory Visit (INDEPENDENT_AMBULATORY_CARE_PROVIDER_SITE_OTHER): Payer: BLUE CROSS/BLUE SHIELD | Admitting: Family

## 2015-06-08 ENCOUNTER — Other Ambulatory Visit: Payer: BLUE CROSS/BLUE SHIELD | Admitting: Family

## 2015-06-08 VITALS — BP 142/54 | HR 84 | Temp 98.4°F | Resp 16 | Ht 62.0 in | Wt 154.6 lb

## 2015-06-08 DIAGNOSIS — C439 Malignant melanoma of skin, unspecified: Secondary | ICD-10-CM

## 2015-06-08 DIAGNOSIS — C799 Secondary malignant neoplasm of unspecified site: Secondary | ICD-10-CM | POA: Diagnosis not present

## 2015-06-08 DIAGNOSIS — R1013 Epigastric pain: Secondary | ICD-10-CM | POA: Diagnosis not present

## 2015-06-08 DIAGNOSIS — Z4889 Encounter for other specified surgical aftercare: Secondary | ICD-10-CM | POA: Diagnosis not present

## 2015-06-08 LAB — CBC WITH DIFFERENTIAL/PLATELET
BASOS PCT: 0.4 % (ref 0.0–3.0)
Basophils Absolute: 0.1 10*3/uL (ref 0.0–0.1)
EOS ABS: 0.4 10*3/uL (ref 0.0–0.7)
Eosinophils Relative: 2.4 % (ref 0.0–5.0)
HEMATOCRIT: 43.2 % (ref 36.0–46.0)
Hemoglobin: 14.3 g/dL (ref 12.0–15.0)
LYMPHS ABS: 3.2 10*3/uL (ref 0.7–4.0)
Lymphocytes Relative: 19.3 % (ref 12.0–46.0)
MCHC: 33.1 g/dL (ref 30.0–36.0)
MCV: 88.7 fl (ref 78.0–100.0)
Monocytes Absolute: 0.9 10*3/uL (ref 0.1–1.0)
Monocytes Relative: 5.7 % (ref 3.0–12.0)
NEUTROS ABS: 12 10*3/uL — AB (ref 1.4–7.7)
Neutrophils Relative %: 72.2 % (ref 43.0–77.0)
PLATELETS: 411 10*3/uL — AB (ref 150.0–400.0)
RBC: 4.87 Mil/uL (ref 3.87–5.11)
RDW: 13.4 % (ref 11.5–15.5)
WBC: 16.6 10*3/uL — ABNORMAL HIGH (ref 4.0–10.5)

## 2015-06-08 LAB — COMPREHENSIVE METABOLIC PANEL
ALT: 10 U/L (ref 0–35)
AST: 13 U/L (ref 0–37)
Albumin: 4.3 g/dL (ref 3.5–5.2)
Alkaline Phosphatase: 157 U/L — ABNORMAL HIGH (ref 39–117)
BUN: 7 mg/dL (ref 6–23)
CHLORIDE: 103 meq/L (ref 96–112)
CO2: 26 meq/L (ref 19–32)
CREATININE: 0.81 mg/dL (ref 0.40–1.20)
Calcium: 9.8 mg/dL (ref 8.4–10.5)
GFR: 78.04 mL/min (ref >60.00)
GLUCOSE: 81 mg/dL (ref 70–99)
Potassium: 4.1 mEq/L (ref 3.5–5.1)
SODIUM: 139 meq/L (ref 135–145)
Total Bilirubin: 1 mg/dL (ref 0.2–1.2)
Total Protein: 7.7 g/dL (ref 6.0–8.3)

## 2015-06-08 NOTE — Progress Notes (Signed)
Pre visit review using our clinic review tool, if applicable. No additional management support is needed unless otherwise documented below in the visit note. 

## 2015-06-08 NOTE — Patient Instructions (Signed)
Please complete lab work prior to leaving. Call if GI symptoms worsen or do not improve. Go to ER if you develop severe/worsening abdominal pain or if you cannot keep down food/medicine.

## 2015-06-08 NOTE — Progress Notes (Signed)
Subjective:    Patient ID: Felicia Richmond, female    DOB: July 02, 1961, 54 y.o.   MRN: [id number]  HPI  Felicia Richmond is a 54 yr old female with Stage IV Metastatic Melanoma, who presents today with report that last night she developed sudden nausea and abdominal burning, "like after you eat spicy food."  She report mild GI upset.  She is currently tolerating PO's. Denied CP or SOB.   Metastatic Melanoma-  She recently re-established with Dr. Marin Olp (hematology) and had a biopsy of a right breast mass 8 days ago which unfortunately revealed a melanoma met. She has 2 stitches in the the biopsy site which she would like removed today.    Dr. Marin Olp has "moved up" her planned CT chest/abdomen and pelvis to re-evaluate the progression of her disease.  She admits to feeling very discouraged.       Review of Systems See HPI  Past Medical History  Diagnosis Date  . Thyroid disease   . Cancer ([place]) 08/25/14    metastatic melanoma; stage IV  . Emphysema of lung ([place])   . Glaucoma   . Stroke ([place])   . Meniere's disease   . Splenic infarct   . Hyperlipidemia 04/26/2015    Social History   Social History  . Marital Status: Married    Spouse Name: N/A  . Number of Children: N/A  . Years of Education: N/A   Occupational History  . Not on file.   Social History Main Topics  . Smoking status: Current Every Day Smoker -- 1.00 packs/day for 36 years    Types: Cigarettes  . Smokeless tobacco: Never Used  . Alcohol Use: No  . Drug Use: No  . Sexual Activity: Yes    Birth Control/ Protection: None   Other Topics Concern  . 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 Cholecystectomy  1992    Family History  Problem Relation Age of Onset  . Thyroid  disease Mother   . Thyroid disease Sister   . Cancer Sister   . Heart attack Brother     Allergies  Allergen Reactions  . Cotellic [Cobimetinib] Diarrhea  . Oxycodone Nausea And Vomiting  . Pseudoephedrine Other (See Comments)    Makes patient feel weird  . Scopolamine Other (See Comments)  . Tramadol     Other reaction(s): GI Upset (intolerance)    Current Outpatient Prescriptions on File Prior to Visit  Medication Sig Dispense Refill  . atorvastatin (LIPITOR) 40 MG tablet Take 1 tablet (40 mg total) by mouth daily. 30 tablet 3  . Meclizine HCl 25 MG CHEW Chew 25 mg by mouth as needed (dizziness).     . methimazole (TAPAZOLE) 5 MG tablet Take 5 mg by mouth daily.     Marland Kitchen OVER THE COUNTER MEDICATION Take 2 mg by mouth daily. [facility] - Anti-diarrheal    . potassium chloride (K-DUR) 10 MEQ tablet Take 1 tablet (10 mEq total) by mouth daily. 30 tablet 5  . prochlorperazine (COMPAZINE) 10 MG tablet Take 10 mg by mouth as needed.     . traMADol (ULTRAM) 50 MG tablet Take 50 mg by mouth as needed. Pt takes 1/2 tablet as  needed.     No current facility-administered medications on file prior to visit.    BP 142/54 mmHg  Pulse 84  Temp(Src) 98.4 F (36.9 C) (Oral)  Resp 16  Ht 5' 2"  (1.575 m)  Wt 154 lb 9.6 oz (70.126 kg)  BMI 28.27 kg/m2  SpO2 100%  LMP 08/26/2007       Objective:   Physical Exam  Constitutional: She is oriented to person, place, and time. She appears well-developed and well-nourished.  HENT:  Head: Normocephalic and atraumatic.  Cardiovascular: Normal rate, regular rhythm and normal heart sounds.   No murmur heard. Pulmonary/Chest: Effort normal and breath sounds normal. No respiratory distress. She has no wheezes.  Firm, golf ball sized mass noted in the right breast at 6 oclock  Abdominal: Soft. Bowel sounds are normal. She exhibits no distension. There is no tenderness. There is no rebound.  Musculoskeletal: She exhibits no edema.  Neurological: She  is alert and oriented to person, place, and time.  Skin:  Small incision right breast, well healed, 2 sutures intact  Psychiatric: Her behavior is normal. Judgment and thought content normal.  tearful          Assessment & Plan:  Epigastric Pain/Nausea- pt has a planned upcoming CT chest/abd/pelvis.  She is tolerating PO's and vitals are stable.  Symptoms could be viral etiology, or it could be related to her metastatic disease. She understands to go to the ER if she develops severe/worsening abdominal pain, or if unable to keep down food or drink. Labs are performed.  She has mild leukocytosis however this is within range for her baseline.  She is afebrile. Alk phos is elevated (likely related to metastatic disease) otherwise LFT's are normal.    Suture removal- 2 sutures are removed today from right breast.  Pt tolerated removal.

## 2015-06-09 ENCOUNTER — Encounter: Payer: Self-pay | Admitting: Family

## 2015-06-09 NOTE — Assessment & Plan Note (Signed)
Now with progressive disease.  Management per oncology.

## 2015-06-13 ENCOUNTER — Telehealth: Payer: Self-pay | Admitting: *Deleted

## 2015-06-13 NOTE — Telephone Encounter (Signed)
Patient has increased pain after stopping her po chemo per Dr Antonieta Pert instruction. She wants to know if it could be related. Spoke to Dr Marin Olp who wants to see patient this Friday to assess her and review scans from tomorrow. He believes pain is from progression. Spoke to patient and she is aware of appointment on Friday.

## 2015-06-14 ENCOUNTER — Encounter (HOSPITAL_BASED_OUTPATIENT_CLINIC_OR_DEPARTMENT_OTHER): Payer: Self-pay

## 2015-06-14 ENCOUNTER — Ambulatory Visit (HOSPITAL_BASED_OUTPATIENT_CLINIC_OR_DEPARTMENT_OTHER)
Admission: RE | Admit: 2015-06-14 | Discharge: 2015-06-14 | Disposition: A | Discharge status: Home / Self Care | Payer: BLUE CROSS/BLUE SHIELD | Source: Ambulatory Visit | Attending: Hematology & Oncology | Admitting: Hematology & Oncology

## 2015-06-14 DIAGNOSIS — C799 Secondary malignant neoplasm of unspecified site: Secondary | ICD-10-CM | POA: Diagnosis present

## 2015-06-14 DIAGNOSIS — R590 Localized enlarged lymph nodes: Secondary | ICD-10-CM | POA: Diagnosis not present

## 2015-06-14 DIAGNOSIS — C7972 Secondary malignant neoplasm of left adrenal gland: Secondary | ICD-10-CM | POA: Insufficient documentation

## 2015-06-14 DIAGNOSIS — C786 Secondary malignant neoplasm of retroperitoneum and peritoneum: Secondary | ICD-10-CM | POA: Insufficient documentation

## 2015-06-14 DIAGNOSIS — C7971 Secondary malignant neoplasm of right adrenal gland: Secondary | ICD-10-CM | POA: Diagnosis not present

## 2015-06-14 DIAGNOSIS — E041 Nontoxic single thyroid nodule: Secondary | ICD-10-CM | POA: Insufficient documentation

## 2015-06-14 DIAGNOSIS — C439 Malignant melanoma of skin, unspecified: Secondary | ICD-10-CM

## 2015-06-14 DIAGNOSIS — C7981 Secondary malignant neoplasm of breast: Secondary | ICD-10-CM | POA: Diagnosis not present

## 2015-06-14 DIAGNOSIS — C784 Secondary malignant neoplasm of small intestine: Secondary | ICD-10-CM | POA: Diagnosis not present

## 2015-06-14 DIAGNOSIS — C785 Secondary malignant neoplasm of large intestine and rectum: Secondary | ICD-10-CM | POA: Insufficient documentation

## 2015-06-14 DIAGNOSIS — J432 Centrilobular emphysema: Secondary | ICD-10-CM | POA: Insufficient documentation

## 2015-06-14 HISTORY — DX: Essential (primary) hypertension: I10

## 2015-06-14 MED ORDER — IOHEXOL 300 MG/ML  SOLN
100.0000 mL | Freq: Once | INTRAMUSCULAR | Status: AC | PRN
  Administered 2015-06-14: 100 mL via INTRAVENOUS

## 2015-06-15 ENCOUNTER — Encounter: Payer: Self-pay | Admitting: Hematology & Oncology

## 2015-06-15 ENCOUNTER — Telehealth: Payer: Self-pay | Admitting: Hematology & Oncology

## 2015-06-15 ENCOUNTER — Ambulatory Visit (HOSPITAL_BASED_OUTPATIENT_CLINIC_OR_DEPARTMENT_OTHER): Payer: BLUE CROSS/BLUE SHIELD | Admitting: Hematology & Oncology

## 2015-06-15 VITALS — BP 156/58 | HR 104 | Temp 97.8°F | Resp 14 | Ht 62.0 in | Wt 151.0 lb

## 2015-06-15 DIAGNOSIS — C785 Secondary malignant neoplasm of large intestine and rectum: Secondary | ICD-10-CM | POA: Diagnosis not present

## 2015-06-15 DIAGNOSIS — C784 Secondary malignant neoplasm of small intestine: Secondary | ICD-10-CM | POA: Diagnosis not present

## 2015-06-15 DIAGNOSIS — C799 Secondary malignant neoplasm of unspecified site: Secondary | ICD-10-CM

## 2015-06-15 DIAGNOSIS — C778 Secondary and unspecified malignant neoplasm of lymph nodes of multiple regions: Secondary | ICD-10-CM | POA: Diagnosis not present

## 2015-06-15 DIAGNOSIS — C7970 Secondary malignant neoplasm of unspecified adrenal gland: Secondary | ICD-10-CM

## 2015-06-15 DIAGNOSIS — C439 Malignant melanoma of skin, unspecified: Secondary | ICD-10-CM

## 2015-06-15 DIAGNOSIS — R64 Cachexia: Secondary | ICD-10-CM

## 2015-06-15 MED ORDER — BUPRENORPHINE 10 MCG/HR TD PTWK: 10.0000 ug | MEDICATED_PATCH | TRANSDERMAL | Status: DC

## 2015-06-15 MED ORDER — DRONABINOL 5 MG PO CAPS: 2.5000 mg | ORAL_CAPSULE | Freq: Two times a day (BID) | ORAL | Status: DC

## 2015-06-15 NOTE — Telephone Encounter (Signed)
I just spoke to [place] and he advised [employer], they are billable codes.    [place]  KEYTRUDA   Dx: C79.9; W09   Ref: [id number]   P: [phone]

## 2015-06-15 NOTE — Progress Notes (Signed)
Hematology and Oncology Follow Up Visit  Felicia Richmond [id number] [birth year] 54 y.o. 06/15/2015   Principle Diagnosis:  Metastatic melanoma- BRAF (+) - progressive Current Therapy:    ZELBORAF 720 mg by mouth twice a day     Interim History:  Felicia Richmond is back for follow-up. She is clearly progressing.  We got the pathology report back from the biopsy done by her dermatologist. This, unfortunately, showed melanoma.  We went ahead and is CT scan of her chest, abdomen and pelvis. This also showed progressive disease. She had progression of disease in the left axillary and anterior mediastinal lymph nodes. There was right lower breast chassis is. She had bilateral adrenal metastasis. There is metastasis to the small bowel and proximal transverse colon.  She is not eating all that well. She just does not have much of an appetite.  She's had no nausea or vomiting. She is not noting bleeding.  She is due for an MRI of the brain over the weekend. This is for routine staging. She's not having any type of neurological issues.  She's not noted any fever. She's had no bleeding. She's had no leg swelling.  Overall, her performance status is ECOG 1.  no change in bowel or bladder habits. She's had no bleeding. She's had no fever. She's had no leg swelling.    Overall, her performance status is ECOG 1.           Medications:  Current outpatient prescriptions:  .  atorvastatin (LIPITOR) 40 MG tablet, Take 1 tablet (40 mg total) by mouth daily., Disp: 30 tablet, Rfl: 3 .  Meclizine HCl 25 MG CHEW, Chew 25 mg by mouth as needed (dizziness). , Disp: , Rfl:  .  methimazole (TAPAZOLE) 5 MG tablet, Take 5 mg by mouth daily. , Disp: , Rfl:  .  OVER THE COUNTER MEDICATION, Take 2 mg by mouth daily. [facility] - Anti-diarrheal, Disp: , Rfl:  .  potassium chloride (K-DUR) 10 MEQ tablet, Take 1 tablet (10 mEq total) by mouth daily., Disp: 30 tablet, Rfl: 5 .  prochlorperazine (COMPAZINE) 10 MG  tablet, Take 10 mg by mouth as needed. , Disp: , Rfl:  .  traMADol (ULTRAM) 50 MG tablet, Take 50 mg by mouth as needed. Pt takes 1/2 tablet as needed., Disp: , Rfl:  .  buprenorphine (BUTRANS) 10 MCG/HR PTWK patch, Place 1 patch (10 mcg total) onto the skin once a week., Disp: 4 patch, Rfl: 0 .  dronabinol (MARINOL) 5 MG capsule, Take 1 capsule (5 mg total) by mouth 2 (two) times daily before a meal., Disp: 60 capsule, Rfl: 0  Allergies:  Allergies  Allergen Reactions  . Cotellic [Cobimetinib] Diarrhea  . Oxycodone Nausea And Vomiting  . Pseudoephedrine Other (See Comments)    Makes patient feel weird  . Scopolamine Other (See Comments)  . Tramadol     Other reaction(s): GI Upset (intolerance)    Past Medical History, Surgical history, Social history, and Family History were reviewed and updated.  Review of Systems: As above  Physical Exam:  height is 5' 2"  (1.575 m) and weight is 151 lb (68.493 kg). Her oral temperature is 97.8 F (36.6 C). Her blood pressure is 156/58 and her pulse is 104. Her respiration is 14.   Wt Readings from Last 3 Encounters:  06/15/15 151 lb (68.493 kg)  06/08/15 154 lb 9.6 oz (70.126 kg)  06/01/15 153 lb (69.4 kg)     Well-developed and well-nourished white female in  no obvious distress. Head and neck exam shows no ocular or oral lesions. There are no palpable cervical or supraclavicular lymph nodes. Lungs are clear. Cardiac exam regular rate and rhythm with no murmurs, rubs or bruits. Axillary exam shows no bilateral axillary adenopathy. Abdomen is soft. She has good bowel sounds. There is no fluid wave. There is no palpable liver or spleen tip. Back exam shows no tenderness over the spine, ribs or hips. Extremities shows no clubbing, cyanosis or edema. Neurological exam shows no focal neurological deficits. Skin exam shows no rashes, ecchymoses or petechia.  Lab Results  Component Value Date   WBC 16.6* 06/08/2015   HGB 14.3 06/08/2015   HCT 43.2  06/08/2015   MCV 88.7 06/08/2015   PLT 411.0* 06/08/2015     Chemistry      Component Value Date/Time   NA 139 06/08/2015 1035   NA 135 06/01/2015 1153   K 4.1 06/08/2015 1035   K 3.9 06/01/2015 1153   CL 103 06/08/2015 1035   CL 102 06/01/2015 1153   CO2 26 06/08/2015 1035   CO2 26 06/01/2015 1153   BUN 7 06/08/2015 1035   BUN 9 06/01/2015 1153   CREATININE 0.81 06/08/2015 1035   CREATININE 1.1 06/01/2015 1153      Component Value Date/Time   CALCIUM 9.8 06/08/2015 1035   CALCIUM 9.8 06/01/2015 1153   ALKPHOS 157* 06/08/2015 1035   ALKPHOS 152* 06/01/2015 1153   AST 13 06/08/2015 1035   AST 21 06/01/2015 1153   ALT 10 06/08/2015 1035   ALT 15 06/01/2015 1153   BILITOT 1.0 06/08/2015 1035   BILITOT 0.80 06/01/2015 1153         Impression and Plan: Felicia Richmond is 54 year old white female with metastatic melanoma.   I believe that we are going to have to treat her with immunotherapy now.  Her performance status is good enough to tolerate immunotherapy.  I think that using single agent Keytruda would be very reasonable.  I the response rates with a Keytruda should be over 50%. We will be able to tell fairly easily how she is doing with the palpable subcutaneous nodules.  We need to remember that with immunotherapy, there can often be initial "pseudo-progression" before a response is noted.  I and word about her pain. I will try her on a Butrans patch. We'll try 10 mg patch that she puts on once a week.  I also will give her some Marinol to try to help with her appetite.  I spent about 45 minutes with she and her husband. I talked to them about the results of the scan area and I explained to them how Beryle Flock works.  She will need a Port-A-Cath to be placed.  I will like to try to get started on treatment in a week or so.  We will have to see about getting the Keytruda paid for. It is clearly recommended by [facility].  We will have her first treatment on November 2.  This is her birthday but she does not want to change the date.  Volanda Napoleon, MD 10/21/20161:59 PM

## 2015-06-16 ENCOUNTER — Ambulatory Visit (HOSPITAL_BASED_OUTPATIENT_CLINIC_OR_DEPARTMENT_OTHER)
Admission: RE | Admit: 2015-06-16 | Discharge: 2015-06-16 | Disposition: A | Discharge status: Home / Self Care | Payer: BLUE CROSS/BLUE SHIELD | Source: Ambulatory Visit | Attending: Hematology & Oncology | Admitting: Hematology & Oncology

## 2015-06-16 DIAGNOSIS — C799 Secondary malignant neoplasm of unspecified site: Secondary | ICD-10-CM | POA: Insufficient documentation

## 2015-06-16 DIAGNOSIS — C439 Malignant melanoma of skin, unspecified: Secondary | ICD-10-CM

## 2015-06-20 ENCOUNTER — Telehealth: Payer: Self-pay | Admitting: *Deleted

## 2015-06-20 NOTE — Telephone Encounter (Addendum)
Patient aware of results.   ----- Message from Volanda Napoleon, MD sent at 06/18/2015  9:54 AM EDT ----- Call - NO melanoma in the brain!!  Laurey Arrow

## 2015-06-21 ENCOUNTER — Other Ambulatory Visit: Payer: Self-pay

## 2015-06-24 ENCOUNTER — Telehealth: Payer: Self-pay | Admitting: Family

## 2015-06-24 NOTE — Telephone Encounter (Signed)
Please contact pt re: unread mychart messages.

## 2015-06-25 ENCOUNTER — Other Ambulatory Visit: Payer: Self-pay | Admitting: Radiology

## 2015-06-26 ENCOUNTER — Encounter (HOSPITAL_COMMUNITY): Payer: Self-pay

## 2015-06-26 ENCOUNTER — Other Ambulatory Visit: Payer: Self-pay | Admitting: *Deleted

## 2015-06-26 ENCOUNTER — Encounter: Payer: Self-pay | Admitting: *Deleted

## 2015-06-26 ENCOUNTER — Ambulatory Visit (HOSPITAL_COMMUNITY)
Admission: RE | Admit: 2015-06-26 | Discharge: 2015-06-26 | Disposition: A | Discharge status: Home / Self Care | Payer: BLUE CROSS/BLUE SHIELD | Source: Ambulatory Visit | Attending: Hematology & Oncology | Admitting: Hematology & Oncology

## 2015-06-26 ENCOUNTER — Other Ambulatory Visit: Payer: BLUE CROSS/BLUE SHIELD

## 2015-06-26 ENCOUNTER — Other Ambulatory Visit: Payer: Self-pay | Admitting: Hematology & Oncology

## 2015-06-26 DIAGNOSIS — C439 Malignant melanoma of skin, unspecified: Secondary | ICD-10-CM

## 2015-06-26 DIAGNOSIS — C799 Secondary malignant neoplasm of unspecified site: Secondary | ICD-10-CM

## 2015-06-26 DIAGNOSIS — E079 Disorder of thyroid, unspecified: Secondary | ICD-10-CM | POA: Diagnosis not present

## 2015-06-26 DIAGNOSIS — J432 Centrilobular emphysema: Secondary | ICD-10-CM | POA: Insufficient documentation

## 2015-06-26 DIAGNOSIS — Z8673 Personal history of transient ischemic attack (TIA), and cerebral infarction without residual deficits: Secondary | ICD-10-CM | POA: Insufficient documentation

## 2015-06-26 DIAGNOSIS — E785 Hyperlipidemia, unspecified: Secondary | ICD-10-CM | POA: Diagnosis not present

## 2015-06-26 DIAGNOSIS — F17210 Nicotine dependence, cigarettes, uncomplicated: Secondary | ICD-10-CM | POA: Diagnosis not present

## 2015-06-26 DIAGNOSIS — I10 Essential (primary) hypertension: Secondary | ICD-10-CM | POA: Insufficient documentation

## 2015-06-26 DIAGNOSIS — R64 Cachexia: Secondary | ICD-10-CM

## 2015-06-26 LAB — CBC WITH DIFFERENTIAL/PLATELET
BASOS PCT: 1 %
Basophils Absolute: 0.1 10*3/uL (ref 0.0–0.1)
Eosinophils Absolute: 0.4 10*3/uL (ref 0.0–0.7)
Eosinophils Relative: 2 %
HEMATOCRIT: 40 % (ref 36.0–46.0)
HEMOGLOBIN: 12.8 g/dL (ref 12.0–15.0)
LYMPHS ABS: 4.5 10*3/uL — AB (ref 0.7–4.0)
LYMPHS PCT: 24 %
MCH: 28.8 pg (ref 26.0–34.0)
MCHC: 32 g/dL (ref 30.0–36.0)
MCV: 90.1 fL (ref 78.0–100.0)
MONO ABS: 1.2 10*3/uL — AB (ref 0.1–1.0)
MONOS PCT: 6 %
NEUTROS ABS: 12.9 10*3/uL — AB (ref 1.7–7.7)
NEUTROS PCT: 67 %
Platelets: 516 10*3/uL — ABNORMAL HIGH (ref 150–400)
RBC: 4.44 MIL/uL (ref 3.87–5.11)
RDW: 12.6 % (ref 11.5–15.5)
WBC: 19 10*3/uL — ABNORMAL HIGH (ref 4.0–10.5)

## 2015-06-26 LAB — PROTIME-INR
INR: 1.08 (ref 0.00–1.49)
Prothrombin Time: 14.2 seconds (ref 11.6–15.2)

## 2015-06-26 MED ORDER — CEFAZOLIN SODIUM-DEXTROSE 2-3 GM-% IV SOLR
2.0000 g | Freq: Once | INTRAVENOUS | Status: AC
  Administered 2015-06-26: 2 g via INTRAVENOUS

## 2015-06-26 MED ORDER — LIDOCAINE-PRILOCAINE 2.5-2.5 % EX CREA: TOPICAL_CREAM | CUTANEOUS | Status: DC

## 2015-06-26 MED ORDER — ONDANSETRON HCL 8 MG PO TABS: 8.0000 mg | ORAL_TABLET | Freq: Two times a day (BID) | ORAL | Status: DC | PRN

## 2015-06-26 MED ORDER — HEPARIN SOD (PORK) LOCK FLUSH 100 UNIT/ML IV SOLN
INTRAVENOUS | Status: AC | PRN
  Administered 2015-06-26: 500 [IU]

## 2015-06-26 MED ORDER — MIDAZOLAM HCL 2 MG/2ML IJ SOLN
INTRAMUSCULAR | Status: AC
  Filled 2015-06-26: qty 6

## 2015-06-26 MED ORDER — LORAZEPAM 0.5 MG PO TABS: 0.5000 mg | ORAL_TABLET | Freq: Four times a day (QID) | ORAL | Status: DC | PRN

## 2015-06-26 MED ORDER — MIDAZOLAM HCL 2 MG/2ML IJ SOLN
INTRAMUSCULAR | Status: AC | PRN
  Administered 2015-06-26 (×5): 1 mg via INTRAVENOUS

## 2015-06-26 MED ORDER — PROCHLORPERAZINE MALEATE 10 MG PO TABS: 10.0000 mg | ORAL_TABLET | Freq: Four times a day (QID) | ORAL | Status: DC | PRN

## 2015-06-26 MED ORDER — HEPARIN SOD (PORK) LOCK FLUSH 100 UNIT/ML IV SOLN
INTRAVENOUS | Status: AC
  Filled 2015-06-26: qty 5

## 2015-06-26 MED ORDER — SODIUM CHLORIDE 0.9 % IV SOLN
INTRAVENOUS | Status: DC
  Administered 2015-06-26: 500 mL via INTRAVENOUS

## 2015-06-26 MED ORDER — FENTANYL CITRATE (PF) 100 MCG/2ML IJ SOLN
INTRAMUSCULAR | Status: AC
  Filled 2015-06-26: qty 4

## 2015-06-26 MED ORDER — LIDOCAINE-EPINEPHRINE 2 %-1:100000 IJ SOLN
INTRAMUSCULAR | Status: AC
  Filled 2015-06-26: qty 1

## 2015-06-26 MED ORDER — FENTANYL CITRATE (PF) 100 MCG/2ML IJ SOLN
INTRAMUSCULAR | Status: AC | PRN
  Administered 2015-06-26 (×2): 50 ug via INTRAVENOUS

## 2015-06-26 MED ORDER — LIDOCAINE HCL 1 % IJ SOLN
INTRAMUSCULAR | Status: AC
  Filled 2015-06-26: qty 20

## 2015-06-26 MED ORDER — CEFAZOLIN SODIUM-DEXTROSE 2-3 GM-% IV SOLR
INTRAVENOUS | Status: AC
  Administered 2015-06-26: 2 g via INTRAVENOUS
  Filled 2015-06-26: qty 50

## 2015-06-26 NOTE — Procedures (Signed)
R IJ Port cathter placement with US and fluoroscopy No complication No blood loss. See complete dictation in [facility] PACS.  

## 2015-06-26 NOTE — Telephone Encounter (Signed)
Notified pt of 06/08/15 email. Pt voices understanding re: labs and states that nausea and stomach burning have passed.

## 2015-06-26 NOTE — H&P (Signed)
Chief Complaint: Patient was seen in consultation today for port a cath placement  Referring Physician(s): Ennever,Peter R  History of Present Illness: Felicia Richmond is a 54 y.o. female with history of progressive metastatic melanoma who presents today for port a cath placement for immunotherapy.  Past Medical History  Diagnosis Date  . Thyroid disease   . Cancer ([place]) 08/25/14    metastatic melanoma; stage IV  . Emphysema of lung ([place])   . Glaucoma   . Stroke ([place])   . Meniere's disease   . Splenic infarct   . Hyperlipidemia 04/26/2015  . Hypertension     Past Surgical History  Procedure Laterality Date  . Cholecystectomy  1992    Allergies: Cotellic; Oxycodone; Pseudoephedrine; Scopolamine; and Tramadol  Medications: Prior to Admission medications   Medication Sig Start Date End Date Taking? Authorizing Provider  atorvastatin (LIPITOR) 40 MG tablet Take 1 tablet (40 mg total) by mouth daily. 04/27/15  Yes Debbrah Alar, NP  buprenorphine (BUTRANS) 10 MCG/HR PTWK patch Place 1 patch (10 mcg total) onto the skin once a week. 06/15/15  Yes Volanda Napoleon, MD  dronabinol (MARINOL) 5 MG capsule Take 1 capsule (5 mg total) by mouth 2 (two) times daily before a meal. 06/15/15  Yes Volanda Napoleon, MD  ibuprofen (ADVIL,MOTRIN) 200 MG tablet Take 400 mg by mouth every 6 (six) hours as needed for mild pain.   Yes Historical Provider, MD  methimazole (TAPAZOLE) 5 MG tablet Take 5 mg by mouth daily.  09/01/14  Yes Historical Provider, MD  potassium chloride (K-DUR) 10 MEQ tablet Take 1 tablet (10 mEq total) by mouth daily. 06/04/15  Yes Debbrah Alar, NP  prochlorperazine (COMPAZINE) 10 MG tablet Take 10 mg by mouth as needed.  11/14/14  Yes Historical Provider, MD  lidocaine-prilocaine (EMLA) cream Apply to affected area once 06/26/15   Volanda Napoleon, MD  LORazepam (ATIVAN) 0.5 MG tablet Take 1 tablet (0.5 mg total) by mouth every 6 (six) hours as needed (Nausea or  vomiting). 06/26/15   Volanda Napoleon, MD  Meclizine HCl 25 MG CHEW Chew 25 mg by mouth daily as needed (dizziness).     Historical Provider, MD  ondansetron (ZOFRAN) 8 MG tablet Take 1 tablet (8 mg total) by mouth 2 (two) times daily as needed (Nausea or vomiting). 06/26/15   Volanda Napoleon, MD  prochlorperazine (COMPAZINE) 10 MG tablet Take 1 tablet (10 mg total) by mouth every 6 (six) hours as needed (Nausea or vomiting). 06/26/15   Volanda Napoleon, MD     Family History  Problem Relation Age of Onset  . Thyroid disease Mother   . Thyroid disease Sister   . Cancer Sister   . Heart attack Brother     Social History   Social History  . Marital Status: Married    Spouse Name: N/A  . Number of Children: N/A  . Years of Education: N/A   Social History Main Topics  . Smoking status: Current Every Day Smoker -- 1.00 packs/day for 36 years    Types: Cigarettes  . Smokeless tobacco: Never Used  . Alcohol Use: No  . Drug Use: No  . Sexual Activity: Yes    Birth Control/ Protection: None   Other Topics Concern  . 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  Respiratory: Positive for cough. Negative for shortness of breath.   Cardiovascular: Negative for chest pain and leg swelling.  Gastrointestinal: Positive for nausea and abdominal pain. Negative for vomiting and blood in stool.  Genitourinary: Negative for dysuria and hematuria.  Musculoskeletal: Positive for back pain.  Neurological: Negative for headaches.    Vital Signs: BP 148/49 mmHg  Pulse 93  Temp(Src) 97.8 F (36.6 C) (Oral)  Resp 16  Ht 5\' 1"  (1.549 m)  Wt 149 lb (67.586 kg)  BMI 28.17 kg/m2  SpO2 100%  LMP 08/26/2007  Physical Exam  Constitutional: She is  oriented to person, place, and time. She appears well-developed and well-nourished.  Cardiovascular: Normal rate and regular rhythm.   Pulmonary/Chest: Effort normal and breath sounds normal.  Abdominal: Soft. Bowel sounds are normal. There is tenderness.  Musculoskeletal: Normal range of motion. She exhibits no edema.  Neurological: She is alert and oriented to person, place, and time.    Mallampati Score:     Imaging: Ct Chest W Contrast  06/14/2015  CLINICAL DATA:  Progressive metastatic melanoma, evaluate extent of disease. EXAM: CT CHEST, ABDOMEN, AND PELVIS WITH CONTRAST TECHNIQUE: Multidetector CT imaging of the chest, abdomen and pelvis was performed following the standard protocol during bolus administration of intravenous contrast. CONTRAST:  123mL OMNIPAQUE IOHEXOL 300 MG/ML  SOLN COMPARISON:  09/05/2014 CT of the chest, abdomen and pelvis. FINDINGS: CT CHEST FINDINGS Mediastinum/Nodes: Normal heart size. No pericardial fluid/thickening. Great vessels are normal in course and caliber. No central pulmonary emboli. There is a 2.3 cm hypodense right thyroid nodule, increased from 1.5 cm. Normal esophagus. No right axillary lymphadenopathy. New bulky 4.1 cm left axillary node (series 2/image 18). New right prevascular mediastinal 2.9 cm bulky node (2/23). New anterior mediastinal retrosternal 1.0 cm mildly enlarged node (2/20). Mildly enlarged 1.5 cm left hilar node (2/25) . No right hilar adenopathy. Lungs/Pleura: No pneumothorax. No pleural effusion. Minimal frothy secretions in the dependent lower right tracheal lumen. Mild centrilobular emphysema. No acute consolidative airspace disease, significant pulmonary nodules or lung masses. Musculoskeletal: No aggressive appearing focal osseous lesions. Mild degenerative changes in the thoracic spine. Subcutaneous 4.1 cm heterogeneously enhancing lobulated mass in the right inferior breast (series 2/image 41), significantly increased from 0.9 cm.  CT ABDOMEN PELVIS FINDINGS Hepatobiliary: Normal liver with no liver mass. Status post cholecystectomy. No intrahepatic biliary ductal dilatation. Common bile duct diameter 9 mm, within expected post cholecystectomy limits. Pancreas: Normal, with no mass or duct dilation. Spleen: Normal size. No mass. Adrenals/Urinary Tract: There are large bilateral adrenal metastases measuring 5.6 cm on the right (2/54, significantly increased from 0.9 cm) and 9.6 cm on the left (2/56, previously 9.9 cm, not appreciably changed). No hydronephrosis. No renal masses. Normal bladder. Stomach/Bowel: Grossly normal stomach. Normal caliber small bowel. There is a new 2.7 cm metastasis eccentrically involving a mid small bowel loop (2/84). Normal appendix. There is a new 2.9 cm eccentric colonic wall metastasis in the proximal anterior transverse colon (2/77). Otherwise normal large bowel. Vascular/Lymphatic: Normal caliber abdominal aorta. Patent portal, splenic, hepatic and renal veins. There is a new 4.7 cm heterogeneously enhancing metastasis in the region of the left mesenteric root abutting the inferior distal pancreatic body (2/62). There are top-normal size lower paraesophageal and left para-aortic nodes. Otherwise no pathologically enlarged lymph nodes in the abdomen or pelvis. Reproductive: Grossly normal  anteverted uterus.  No adnexal mass . Other: No pneumoperitoneum, ascites or focal fluid collection. Musculoskeletal: No aggressive appearing focal osseous lesions. IMPRESSION: 1. Significant interval progression of metastatic disease including left axillary and anterior mediastinal lymphadenopathy, right lower breast metastasis, bilateral large adrenal metastases, mid small bowel and proximal transverse colonic metastases and a central mesenteric metastasis. 2. Increased right thyroid 2.3 cm nodule, which could also represent a progressive metastasis. 3. Mild centrilobular emphysema. 4. No evidence of bowel obstruction or  acute bowel inflammation. Normal appendix . Electronically Signed   By: Ilona Sorrel M.D.   On: 06/14/2015 13:59   Mr Jeri Cos DV Contrast  06/16/2015  CLINICAL DATA:  Metastatic melanoma. EXAM: MRI HEAD WITHOUT AND WITH CONTRAST TECHNIQUE: Multiplanar, multiecho pulse sequences of the brain and surrounding structures were obtained without and with intravenous contrast. CONTRAST:  MRI brain 03/22/2014 [facility]. COMPARISON:  MRI brain 03/22/2014. FINDINGS: The remote right frontal lobe infarct is again noted. The diffusion-weighted images demonstrate no acute infarcts. There is no hemorrhage or mass lesion. A remote lacunar infarct of the right cerebellum is stable. A punctate remote lacunar infarct is also stable on the right thalamus. There is minimal white matter disease otherwise. No acute hemorrhage or mass lesion is present. The ventricles are of normal size. No significant extra-axial fluid collection is present. The brainstem is within normal limits. The internal auditory canals are unremarkable. Flow is present in the major intracranial arteries. The globes and orbits are intact. The right maxillary sinuses shrunken compared to the left. Circumferential mucosal thickening are present in the right maxillary sinus. There is mild mucosal thickening in the left maxillary sinus is well. The paranasal sinuses and mastoid air cells are otherwise clear. The globes orbits are intact. Skullbase is within normal limits. Midline structures are otherwise unremarkable. The postcontrast images demonstrate no pathologic enhancement of the brain or meninges to suggest metastatic disease. IMPRESSION: 1. No evidence for metastatic disease to the brain or meninges. 2. Remote right frontal lobe infarct is stable. 3. Remote lacunar infarcts involving the right thalamus and right cerebellum are also stable. Electronically Signed   By: San Morelle M.D.   On: 06/16/2015 12:23   Ct Abdomen Pelvis W  Contrast  06/14/2015  CLINICAL DATA:  Progressive metastatic melanoma, evaluate extent of disease. EXAM: CT CHEST, ABDOMEN, AND PELVIS WITH CONTRAST TECHNIQUE: Multidetector CT imaging of the chest, abdomen and pelvis was performed following the standard protocol during bolus administration of intravenous contrast. CONTRAST:  179mL OMNIPAQUE IOHEXOL 300 MG/ML  SOLN COMPARISON:  09/05/2014 CT of the chest, abdomen and pelvis. FINDINGS: CT CHEST FINDINGS Mediastinum/Nodes: Normal heart size. No pericardial fluid/thickening. Great vessels are normal in course and caliber. No central pulmonary emboli. There is a 2.3 cm hypodense right thyroid nodule, increased from 1.5 cm. Normal esophagus. No right axillary lymphadenopathy. New bulky 4.1 cm left axillary node (series 2/image 18). New right prevascular mediastinal 2.9 cm bulky node (2/23). New anterior mediastinal retrosternal 1.0 cm mildly enlarged node (2/20). Mildly enlarged 1.5 cm left hilar node (2/25) . No right hilar adenopathy. Lungs/Pleura: No pneumothorax. No pleural effusion. Minimal frothy secretions in the dependent lower right tracheal lumen. Mild centrilobular emphysema. No acute consolidative airspace disease, significant pulmonary nodules or lung masses. Musculoskeletal: No aggressive appearing focal osseous lesions. Mild degenerative changes in the thoracic spine. Subcutaneous 4.1 cm heterogeneously enhancing lobulated mass in the right inferior breast (series 2/image 41), significantly increased from 0.9 cm. CT ABDOMEN PELVIS FINDINGS Hepatobiliary:  Normal liver with no liver mass. Status post cholecystectomy. No intrahepatic biliary ductal dilatation. Common bile duct diameter 9 mm, within expected post cholecystectomy limits. Pancreas: Normal, with no mass or duct dilation. Spleen: Normal size. No mass. Adrenals/Urinary Tract: There are large bilateral adrenal metastases measuring 5.6 cm on the right (2/54, significantly increased from 0.9 cm) and  9.6 cm on the left (2/56, previously 9.9 cm, not appreciably changed). No hydronephrosis. No renal masses. Normal bladder. Stomach/Bowel: Grossly normal stomach. Normal caliber small bowel. There is a new 2.7 cm metastasis eccentrically involving a mid small bowel loop (2/84). Normal appendix. There is a new 2.9 cm eccentric colonic wall metastasis in the proximal anterior transverse colon (2/77). Otherwise normal large bowel. Vascular/Lymphatic: Normal caliber abdominal aorta. Patent portal, splenic, hepatic and renal veins. There is a new 4.7 cm heterogeneously enhancing metastasis in the region of the left mesenteric root abutting the inferior distal pancreatic body (2/62). There are top-normal size lower paraesophageal and left para-aortic nodes. Otherwise no pathologically enlarged lymph nodes in the abdomen or pelvis. Reproductive: Grossly normal anteverted uterus.  No adnexal mass . Other: No pneumoperitoneum, ascites or focal fluid collection. Musculoskeletal: No aggressive appearing focal osseous lesions. IMPRESSION: 1. Significant interval progression of metastatic disease including left axillary and anterior mediastinal lymphadenopathy, right lower breast metastasis, bilateral large adrenal metastases, mid small bowel and proximal transverse colonic metastases and a central mesenteric metastasis. 2. Increased right thyroid 2.3 cm nodule, which could also represent a progressive metastasis. 3. Mild centrilobular emphysema. 4. No evidence of bowel obstruction or acute bowel inflammation. Normal appendix . Electronically Signed   By: Ilona Sorrel M.D.   On: 06/14/2015 13:59    Labs:  CBC:  Recent Labs  04/27/15 1134 06/01/15 1153 06/08/15 1035 06/26/15 1058  WBC 12.7* 13.9* 16.6* 19.0*  HGB 15.0 14.8 14.3 12.8  HCT 45.2 43.9 43.2 40.0  PLT 301 356 411.0* 516*    COAGS:  Recent Labs  06/26/15 1058  INR 1.08    BMP:  Recent Labs  11/20/14 1039 04/27/15 1134 06/01/15 1153  06/08/15 1035  NA 137 139 135 139  K 4.4 4.4 3.9 4.1  CL 101 102 102 103  CO2 22 27 26 26   GLUCOSE 79 165* 95 81  BUN 7 9 9 7   CALCIUM 9.4 9.5 9.8 9.8  CREATININE 0.62 0.86 1.1 0.81    LIVER FUNCTION TESTS:  Recent Labs  11/20/14 1039 04/27/15 1134 06/01/15 1153 06/08/15 1035  BILITOT 0.3 0.7 0.80 1.0  AST 10 15 21 13   ALT 8 12 15 10   ALKPHOS 104 168* 152* 157*  PROT 7.0 7.2 8.0 7.7  ALBUMIN 3.9 4.4 4.1 4.3    TUMOR MARKERS: No results for input(s): AFPTM, CEA, CA199, CHROMGRNA in the last 8760 hours.  Assessment and Plan: Felicia Richmond is a 54 y.o. female with history of progressive metastatic melanoma who presents today for port a cath placement for immunotherapy.Risks and benefits discussed with the patient/husband including, but not limited to bleeding, infection, pneumothorax, or fibrin sheath development and need for additional procedures.All of the patient's questions were answered, patient is agreeable to proceed.Consent signed and in chart. WBC today 19.0, up from 16.6  2 weeks ago. Pt denies recent infections, steroid use. Findings d/w Dr. Marin Olp - ok to proceed with port placement.    Thank you for this interesting consult.  I greatly enjoyed meeting Felicia Richmond and look forward to participating in their care.  A copy of  this report was sent to the requesting provider on this date.  Signed: D. Rowe Robert 06/26/2015, 11:36 AM   I spent a total of  15 minutes in face to face in clinical consultation, greater than 50% of which was counseling/coordinating care for port a cath placement

## 2015-06-26 NOTE — Discharge Instructions (Signed)
Implanted Port Insertion, Care After °Refer to this sheet in the next few weeks. These instructions provide you with information on caring for yourself after your procedure. Your health care provider may also give you more specific instructions. Your treatment has been planned according to current medical practices, but problems sometimes occur. Call your health care provider if you have any problems or questions after your procedure. °WHAT TO EXPECT AFTER THE PROCEDURE °After your procedure, it is typical to have the following:  °· Discomfort at the port insertion site. Ice packs to the area will help. °· Bruising on the skin over the port. This will subside in 3-4 days. °HOME CARE INSTRUCTIONS °· After your port is placed, you will get a manufacturer's information card. The card has information about your port. Keep this card with you at all times.   °· Know what kind of port you have. There are many types of ports available.   °· Wear a medical alert bracelet in case of an emergency. This can help alert health care workers that you have a port.   °· The port can stay in for as long as your health care provider believes it is necessary.   °· A home health care nurse may give medicines and take care of the port.   °· You or a family member can get special training and directions for giving medicine and taking care of the port at home.   °SEEK MEDICAL CARE IF:  °· Your port does not flush or you are unable to get a blood return.   °· You have a fever or chills. °SEEK IMMEDIATE MEDICAL CARE IF: °· You have new fluid or pus coming from your incision.   °· You notice a bad smell coming from your incision site.   °· You have swelling, pain, or more redness at the incision or port site.   °· You have chest pain or shortness of breath. °  °This information is not intended to replace advice given to you by your health care provider. Make sure you discuss any questions you have with your health care provider. °  °Document  Released: 06/01/2013 Document Revised: 08/16/2013 Document Reviewed: 06/01/2013 °Elsevier Interactive Patient Education ©2016 [employer] °Implanted Port Home Guide °An implanted port is a type of central line that is placed under the skin. Central lines are used to provide IV access when treatment or nutrition needs to be given through a person's veins. Implanted ports are used for long-term IV access. An implanted port may be placed because:  °· You need IV medicine that would be irritating to the small veins in your hands or arms.   °· You need long-term IV medicines, such as antibiotics.   °· You need IV nutrition for a long period.   °· You need frequent blood draws for lab tests.   °· You need dialysis.   °Implanted ports are usually placed in the chest area, but they can also be placed in the upper arm, the abdomen, or the leg. An implanted port has two main parts:  °· Reservoir. The reservoir is round and will appear as a small, raised area under your skin. The reservoir is the part where a needle is inserted to give medicines or draw blood.   °· Catheter. The catheter is a thin, flexible tube that extends from the reservoir. The catheter is placed into a large vein. Medicine that is inserted into the reservoir goes into the catheter and then into the vein.   °HOW WILL I CARE FOR MY INCISION SITE? °Do not get the   incision site wet. Bathe or shower as directed by your health care provider.  °HOW IS MY PORT ACCESSED? °Special steps must be taken to access the port:  °· Before the port is accessed, a numbing cream can be placed on the skin. This helps numb the skin over the port site.   °· Your health care provider uses a sterile technique to access the port. °· Your health care provider must put on a mask and sterile gloves. °· The skin over your port is cleaned carefully with an antiseptic and allowed to dry. °· The port is gently pinched between sterile gloves, and a needle is inserted into the  port. °· Only "non-coring" port needles should be used to access the port. Once the port is accessed, a blood return should be checked. This helps ensure that the port is in the vein and is not clogged.   °· If your port needs to remain accessed for a constant infusion, a clear (transparent) bandage will be placed over the needle site. The bandage and needle will need to be changed every week, or as directed by your health care provider.   °· Keep the bandage covering the needle clean and dry. Do not get it wet. Follow your health care provider's instructions on how to take a shower or bath while the port is accessed.   °· If your port does not need to stay accessed, no bandage is needed over the port.   °WHAT IS FLUSHING? °Flushing helps keep the port from getting clogged. Follow your health care provider's instructions on how and when to flush the port. Ports are usually flushed with saline solution or a medicine called heparin. The need for flushing will depend on how the port is used.  °· If the port is used for intermittent medicines or blood draws, the port will need to be flushed:   °· After medicines have been given.   °· After blood has been drawn.   °· As part of routine maintenance.   °· If a constant infusion is running, the port may not need to be flushed.   °HOW LONG WILL MY PORT STAY IMPLANTED? °The port can stay in for as long as your health care provider thinks it is needed. When it is time for the port to come out, surgery will be done to remove it. The procedure is similar to the one performed when the port was put in.  °WHEN SHOULD I SEEK IMMEDIATE MEDICAL CARE? °When you have an implanted port, you should seek immediate medical care if:  °· You notice a bad smell coming from the incision site.   °· You have swelling, redness, or drainage at the incision site.   °· You have more swelling or pain at the port site or the surrounding area.   °· You have a fever that is not controlled with  medicine. °  °This information is not intended to replace advice given to you by your health care provider. Make sure you discuss any questions you have with your health care provider. °  °Document Released: 08/11/2005 Document Revised: 06/01/2013 Document Reviewed: 04/18/2013 °Elsevier Interactive Patient Education ©2016 [employer] °Moderate Conscious Sedation, Adult °Sedation is the use of medicines to promote relaxation and relieve discomfort and anxiety. Moderate conscious sedation is a type of sedation. Under moderate conscious sedation you are less alert than normal but are still able to respond to instructions or stimulation. Moderate conscious sedation is used during short medical and dental procedures. It is milder than deep sedation or general anesthesia and   allows you to return to your regular activities sooner. °LET YOUR HEALTH CARE PROVIDER KNOW ABOUT:  °· Any allergies you have. °· All medicines you are taking, including vitamins, herbs, eye drops, creams, and over-the-counter medicines. °· Use of steroids (by mouth or creams). °· Previous problems you or members of your family have had with the use of anesthetics. °· Any blood disorders you have. °· Previous surgeries you have had. °· Medical conditions you have. °· Possibility of pregnancy, if this applies. °· Use of cigarettes, alcohol, or illegal drugs. °RISKS AND COMPLICATIONS °Generally, this is a safe procedure. However, as with any procedure, problems can occur. Possible problems include: °· Oversedation. °· Trouble breathing on your own. You may need to have a breathing tube until you are awake and breathing on your own. °· Allergic reaction to any of the medicines used for the procedure. °BEFORE THE PROCEDURE °· You may have blood tests done. These tests can help show how well your kidneys and liver are working. They can also show how well your blood clots. °· A physical exam will be done.   °· Only take medicines as directed by your  health care provider. You may need to stop taking medicines (such as blood thinners, aspirin, or nonsteroidal anti-inflammatory drugs) before the procedure.   °· Do not eat or drink at least 6 hours before the procedure or as directed by your health care provider. °· Arrange for a responsible adult, family member, or friend to take you home after the procedure. He or she should stay with you for at least 24 hours after the procedure, until the medicine has worn off. °PROCEDURE  °· An intravenous (IV) catheter will be inserted into one of your veins. Medicine will be able to flow directly into your body through this catheter. You may be given medicine through this tube to help prevent pain and help you relax. °· The medical or dental procedure will be done. °AFTER THE PROCEDURE °· You will stay in a recovery area until the medicine has worn off. Your blood pressure and pulse will be checked.   °·  Depending on the procedure you had, you may be allowed to go home when you can tolerate liquids and your pain is under control. °  °This information is not intended to replace advice given to you by your health care provider. Make sure you discuss any questions you have with your health care provider. °  °Document Released: 05/06/2001 Document Revised: 09/01/2014 Document Reviewed: 04/18/2013 °Elsevier Interactive Patient Education ©2016 [employer] °Moderate Conscious Sedation, Adult, Care After °Refer to this sheet in the next few weeks. These instructions provide you with information on caring for yourself after your procedure. Your health care provider may also give you more specific instructions. Your treatment has been planned according to current medical practices, but problems sometimes occur. Call your health care provider if you have any problems or questions after your procedure. °WHAT TO EXPECT AFTER THE PROCEDURE  °After your procedure: °· You may feel sleepy, clumsy, and have poor balance for several  hours. °· Vomiting may occur if you eat too soon after the procedure. °HOME CARE INSTRUCTIONS °· Do not participate in any activities where you could become injured for at least 24 hours. Do not: °¨ Drive. °¨ Swim. °¨ Ride a bicycle. °¨ Operate heavy machinery. °¨ Cook. °¨ Use power tools. °¨ Climb ladders. °¨ Work from a high place. °· Do not make important decisions or sign legal documents until you are improved. °·   If you vomit, drink water, juice, or soup when you can drink without vomiting. Make sure you have little or no nausea before eating solid foods. °· Only take over-the-counter or prescription medicines for pain, discomfort, or fever as directed by your health care provider. °· Make sure you and your family fully understand everything about the medicines given to you, including what side effects may occur. °· You should not drink alcohol, take sleeping pills, or take medicines that cause drowsiness for at least 24 hours. °· If you smoke, do not smoke without supervision. °· If you are feeling better, you may resume normal activities 24 hours after you were sedated. °· Keep all appointments with your health care provider. °SEEK MEDICAL CARE IF: °· Your skin is pale or bluish in color. °· You continue to feel nauseous or vomit. °· Your pain is getting worse and is not helped by medicine. °· You have bleeding or swelling. °· You are still sleepy or feeling clumsy after 24 hours. °SEEK IMMEDIATE MEDICAL CARE IF: °· You develop a rash. °· You have difficulty breathing. °· You develop any type of allergic problem. °· You have a fever. °MAKE SURE YOU: °· Understand these instructions. °· Will watch your condition. °· Will get help right away if you are not doing well or get worse. °  °This information is not intended to replace advice given to you by your health care provider. Make sure you discuss any questions you have with your health care provider. °  °Document Released: 06/01/2013 Document Revised:  09/01/2014 Document Reviewed: 06/01/2013 °Elsevier Interactive Patient Education ©2016 Elsevier Inc. ° °

## 2015-06-27 ENCOUNTER — Ambulatory Visit (HOSPITAL_BASED_OUTPATIENT_CLINIC_OR_DEPARTMENT_OTHER): Payer: BLUE CROSS/BLUE SHIELD

## 2015-06-27 VITALS — BP 131/59 | HR 92 | Temp 98.4°F | Resp 18

## 2015-06-27 DIAGNOSIS — C784 Secondary malignant neoplasm of small intestine: Secondary | ICD-10-CM

## 2015-06-27 DIAGNOSIS — C778 Secondary and unspecified malignant neoplasm of lymph nodes of multiple regions: Secondary | ICD-10-CM

## 2015-06-27 DIAGNOSIS — Z5112 Encounter for antineoplastic immunotherapy: Secondary | ICD-10-CM

## 2015-06-27 DIAGNOSIS — C7971 Secondary malignant neoplasm of right adrenal gland: Secondary | ICD-10-CM

## 2015-06-27 DIAGNOSIS — C7972 Secondary malignant neoplasm of left adrenal gland: Secondary | ICD-10-CM

## 2015-06-27 DIAGNOSIS — C439 Malignant melanoma of skin, unspecified: Secondary | ICD-10-CM | POA: Diagnosis not present

## 2015-06-27 DIAGNOSIS — C799 Secondary malignant neoplasm of unspecified site: Secondary | ICD-10-CM

## 2015-06-27 MED ORDER — HEPARIN SOD (PORK) LOCK FLUSH 100 UNIT/ML IV SOLN
500.0000 [IU] | Freq: Once | INTRAVENOUS | Status: AC | PRN
  Administered 2015-06-27: 500 [IU]
  Filled 2015-06-27: qty 5

## 2015-06-27 MED ORDER — SODIUM CHLORIDE 0.9 % IV SOLN
Freq: Once | INTRAVENOUS | Status: AC
  Administered 2015-06-27: 11:00:00 via INTRAVENOUS

## 2015-06-27 MED ORDER — PROCHLORPERAZINE MALEATE 10 MG PO TABS
10.0000 mg | ORAL_TABLET | Freq: Once | ORAL | Status: AC
  Administered 2015-06-27: 10 mg via ORAL

## 2015-06-27 MED ORDER — SODIUM CHLORIDE 0.9 % IJ SOLN
10.0000 mL | INTRAMUSCULAR | Status: DC | PRN
  Administered 2015-06-27: 10 mL
  Filled 2015-06-27: qty 10

## 2015-06-27 MED ORDER — SODIUM CHLORIDE 0.9 % IV SOLN
2.0000 mg/kg | Freq: Once | INTRAVENOUS | Status: AC
  Administered 2015-06-27: 125 mg via INTRAVENOUS
  Filled 2015-06-27: qty 5

## 2015-06-27 MED ORDER — PROCHLORPERAZINE MALEATE 10 MG PO TABS
ORAL_TABLET | ORAL | Status: AC
  Filled 2015-06-27: qty 1

## 2015-06-27 NOTE — Patient Instructions (Signed)
Pembrolizumab injection What is this medicine? PEMBROLIZUMAB (pem broe liz ue mab) is a monoclonal antibody. It is used to treat melanoma and non-small cell lung cancer. This medicine may be used for other purposes; ask your health care provider or pharmacist if you have questions. What should I tell my health care provider before I take this medicine? They need to know if you have any of these conditions: -diabetes -immune system problems -inflammatory bowel disease -liver disease -lung or breathing disease -lupus -an unusual or allergic reaction to pembrolizumab, other medicines, foods, dyes, or preservatives -pregnant or trying to get pregnant -breast-feeding How should I use this medicine? This medicine is for infusion into a vein. It is given by a health care professional in a hospital or clinic setting. A special MedGuide will be given to you before each treatment. Be sure to read this information carefully each time. Talk to your pediatrician regarding the use of this medicine in children. Special care may be needed. Overdosage: If you think you have taken too much of this medicine contact a poison control center or emergency room at once. NOTE: This medicine is only for you. Do not share this medicine with others. What if I miss a dose? It is important not to miss your dose. Call your doctor or health care professional if you are unable to keep an appointment. What may interact with this medicine? Interactions have not been studied. Give your health care provider a list of all the medicines, herbs, non-prescription drugs, or dietary supplements you use. Also tell them if you smoke, drink alcohol, or use illegal drugs. Some items may interact with your medicine. This list may not describe all possible interactions. Give your health care provider a list of all the medicines, herbs, non-prescription drugs, or dietary supplements you use. Also tell them if you smoke, drink alcohol, or  use illegal drugs. Some items may interact with your medicine. What should I watch for while using this medicine? Your condition will be monitored carefully while you are receiving this medicine. You may need blood work done while you are taking this medicine. Do not become pregnant while taking this medicine or for 4 months after stopping it. Women should inform their doctor if they wish to become pregnant or think they might be pregnant. There is a potential for serious side effects to an unborn child. Talk to your health care professional or pharmacist for more information. Do not breast-feed an infant while taking this medicine or for 4 months after the last dose. What side effects may I notice from receiving this medicine? Side effects that you should report to your doctor or health care professional as soon as possible: -allergic reactions like skin rash, itching or hives, swelling of the face, lips, or tongue -bloody or black, tarry stools -breathing problems -change in the amount of urine -changes in vision -chest pain -chills -dark urine -dizziness or feeling faint or lightheaded -fast or irregular heartbeat -fever -flushing -hair loss -muscle pain -muscle weakness -persistent headache -signs and symptoms of high blood sugar such as dizziness; dry mouth; dry skin; fruity breath; nausea; stomach pain; increased hunger or thirst; increased urination -signs and symptoms of liver injury like dark urine, light-colored stools, loss of appetite, nausea, right upper belly pain, yellowing of the eyes or skin -stomach pain -weight loss Side effects that usually do not require medical attention (Report these to your doctor or health care professional if they continue or are bothersome.):constipation -cough -diarrhea -joint pain -  tiredness This list may not describe all possible side effects. Call your doctor for medical advice about side effects. You may report side effects to FDA at  1-800-FDA-1088. Where should I keep my medicine? This drug is given in a hospital or clinic and will not be stored at home. NOTE: This sheet is a summary. It may not cover all possible information. If you have questions about this medicine, talk to your doctor, pharmacist, or health care provider.    2016, Elsevier/[employer] Standard. (2014-10-10 17:24:19)  

## 2015-07-10 ENCOUNTER — Ambulatory Visit (INDEPENDENT_AMBULATORY_CARE_PROVIDER_SITE_OTHER): Payer: BLUE CROSS/BLUE SHIELD | Admitting: Family

## 2015-07-10 ENCOUNTER — Telehealth: Payer: Self-pay | Admitting: Family

## 2015-07-10 ENCOUNTER — Encounter: Payer: Self-pay | Admitting: Family

## 2015-07-10 ENCOUNTER — Other Ambulatory Visit: Payer: Self-pay | Admitting: *Deleted

## 2015-07-10 VITALS — BP 150/78 | HR 86 | Temp 98.0°F | Resp 16 | Ht 62.0 in | Wt 151.6 lb

## 2015-07-10 DIAGNOSIS — R1084 Generalized abdominal pain: Secondary | ICD-10-CM

## 2015-07-10 DIAGNOSIS — C439 Malignant melanoma of skin, unspecified: Secondary | ICD-10-CM

## 2015-07-10 DIAGNOSIS — R64 Cachexia: Secondary | ICD-10-CM

## 2015-07-10 DIAGNOSIS — C799 Secondary malignant neoplasm of unspecified site: Secondary | ICD-10-CM

## 2015-07-10 MED ORDER — BUPRENORPHINE 20 MCG/HR TD PTWK: 20.0000 ug | MEDICATED_PATCH | TRANSDERMAL | Status: DC

## 2015-07-10 NOTE — Progress Notes (Signed)
Pre visit review using our clinic review tool, if applicable. No additional management support is needed unless otherwise documented below in the visit note. 

## 2015-07-10 NOTE — Telephone Encounter (Signed)
See mychart.  

## 2015-07-10 NOTE — Progress Notes (Signed)
Subjective:    Patient ID: Felicia Richmond, female    DOB: 13-Dec-1960, 54 y.o.   MRN: [id number]  HPI  Felicia Richmond is a 54 yr old female with metastatic melanoma who presents today for 1 month follow up.  When I last saw patient on 10/14 she reported epigastric pain and nausea. She had a CT of her chest/abdomen and pelvis performed .  Unfortunately, her CT scan showed significant interval progression of her metastatic disease including mets to the mid small bowel and proximal transverse colon. Her oncologist started her on a Butrans patch for pain and gave her marinol to help with her appetite.  She was recently started on Ketruda.  Reports abdominal pain is better, but has been taking regular motrin for breakthrough pain.   Appetite is improved on marinol. She has gained a few pounds.  Wt Readings from Last 3 Encounters:  07/10/15 151 lb 9.6 oz (68.765 kg)  06/26/15 149 lb (67.586 kg)  06/15/15 151 lb (68.493 kg)   Review of Systems    see HPI  Past Medical History  Diagnosis Date  . Thyroid disease   . Cancer ([place]) 08/25/14    metastatic melanoma; stage IV  . Emphysema of lung ([place])   . Glaucoma   . Stroke ([place])   . Meniere's disease   . Splenic infarct   . Hyperlipidemia 04/26/2015  . Hypertension     Social History   Social History  . Marital Status: Married    Spouse Name: N/A  . Number of Children: N/A  . Years of Education: N/A   Occupational History  . Not on file.   Social History Main Topics  . Smoking status: Current Every Day Smoker -- 1.00 packs/day for 36 years    Types: Cigarettes  . Smokeless tobacco: Never Used  . Alcohol Use: No  . Drug Use: No  . Sexual Activity: Yes    Birth Control/ Protection: None   Other Topics Concern  . 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 Cholecystectomy  1992    Family History  Problem Relation Age of Onset  . Thyroid disease Mother   . Thyroid disease Sister   . Cancer Sister   . Heart attack Brother     Allergies  Allergen Reactions  . Cotellic [Cobimetinib] Diarrhea  . Oxycodone Nausea And Vomiting  . Pseudoephedrine Other (See Comments)    Makes patient feel weird  . Scopolamine Other (See Comments)  . Tramadol     Other reaction(s): GI Upset (intolerance)    Current Outpatient Prescriptions on File Prior to Visit  Medication Sig Dispense Refill  . atorvastatin (LIPITOR) 40 MG tablet Take 1 tablet (40 mg total) by mouth daily. 30 tablet 3  . buprenorphine (BUTRANS) 10 MCG/HR PTWK patch Place 1 patch (10 mcg total) onto the skin once a week. 4 patch 0  . dronabinol (MARINOL) 5 MG capsule Take 1 capsule (5 mg total) by mouth 2 (two) times daily before a meal. 60 capsule 0  . ibuprofen (ADVIL,MOTRIN) 200 MG tablet Take 400 mg by mouth every 6 (six) hours as needed for mild pain.    Marland Kitchen lidocaine-prilocaine (EMLA) cream Apply to affected  area once 30 g 3  . LORazepam (ATIVAN) 0.5 MG tablet Take 1 tablet (0.5 mg total) by mouth every 6 (six) hours as needed (Nausea or vomiting). 30 tablet 0  . Meclizine HCl 25 MG CHEW Chew 25 mg by mouth daily as needed (dizziness).     . methimazole (TAPAZOLE) 5 MG tablet Take 5 mg by mouth daily.     . ondansetron (ZOFRAN) 8 MG tablet Take 1 tablet (8 mg total) by mouth 2 (two) times daily as needed (Nausea or vomiting). 30 tablet 1  . potassium chloride (K-DUR) 10 MEQ tablet Take 1 tablet (10 mEq total) by mouth daily. 30 tablet 5  . prochlorperazine (COMPAZINE) 10 MG tablet Take 1 tablet (10 mg total) by mouth every 6 (six) hours as needed (Nausea or vomiting). 30 tablet 1   No current facility-administered medications on file prior to visit.    BP 150/78 mmHg  Pulse 86  Temp(Src) 98 F (36.7 C) (Oral)  Resp 16   Ht 5\' 2"  (1.575 m)  Wt 151 lb 9.6 oz (68.765 kg)  BMI 27.72 kg/m2  SpO2 100%  LMP 08/26/2007    Objective:   Physical Exam  Constitutional: She is oriented to person, place, and time. She appears well-developed and well-nourished.  HENT:  Head: Normocephalic and atraumatic.  Cardiovascular: Normal rate, regular rhythm and normal heart sounds.   No murmur heard. Pulmonary/Chest: Effort normal and breath sounds normal. No respiratory distress. She has no wheezes.  Neurological: She is alert and oriented to person, place, and time.  Psychiatric: She has a normal mood and affect. Her behavior is normal. Judgment and thought content normal.          Assessment & Plan:  Abdominal pain- likely related to mets.  Improved on Butrans. I advised to her to discuss her breakthrough pain with oncologist and to try to avoid use of regular motrin due to potential renal and GI side effects.    Metastatic melanoma- treatment per oncology.  Her spirits seem better today. I would like her to have a flu shot if ok with oncology. I have sent them a message to check and will notify pt if OK so that she can book a nurse visit for flu shot.

## 2015-07-13 ENCOUNTER — Other Ambulatory Visit: Payer: BLUE CROSS/BLUE SHIELD

## 2015-07-13 ENCOUNTER — Ambulatory Visit: Payer: BLUE CROSS/BLUE SHIELD | Admitting: Hematology & Oncology

## 2015-07-18 ENCOUNTER — Other Ambulatory Visit (HOSPITAL_BASED_OUTPATIENT_CLINIC_OR_DEPARTMENT_OTHER): Payer: BLUE CROSS/BLUE SHIELD

## 2015-07-18 ENCOUNTER — Encounter: Payer: Self-pay | Admitting: Hematology & Oncology

## 2015-07-18 ENCOUNTER — Ambulatory Visit (HOSPITAL_BASED_OUTPATIENT_CLINIC_OR_DEPARTMENT_OTHER): Payer: BLUE CROSS/BLUE SHIELD | Admitting: Hematology & Oncology

## 2015-07-18 ENCOUNTER — Ambulatory Visit (HOSPITAL_BASED_OUTPATIENT_CLINIC_OR_DEPARTMENT_OTHER): Payer: BLUE CROSS/BLUE SHIELD

## 2015-07-18 VITALS — BP 139/48 | HR 112 | Temp 98.3°F | Resp 18 | Ht 62.0 in | Wt 150.0 lb

## 2015-07-18 DIAGNOSIS — Z5112 Encounter for antineoplastic immunotherapy: Secondary | ICD-10-CM

## 2015-07-18 DIAGNOSIS — C799 Secondary malignant neoplasm of unspecified site: Secondary | ICD-10-CM

## 2015-07-18 DIAGNOSIS — C439 Malignant melanoma of skin, unspecified: Secondary | ICD-10-CM

## 2015-07-18 DIAGNOSIS — N61 Mastitis without abscess: Secondary | ICD-10-CM | POA: Diagnosis not present

## 2015-07-18 DIAGNOSIS — R64 Cachexia: Secondary | ICD-10-CM

## 2015-07-18 LAB — CMP (CANCER CENTER ONLY)
AST: 16 U/L (ref 11–38)
Albumin: 3.2 g/dL — ABNORMAL LOW (ref 3.3–5.5)
Alkaline Phosphatase: 99 U/L — ABNORMAL HIGH (ref 26–84)
BILIRUBIN TOTAL: 0.7 mg/dL (ref 0.20–1.60)
BUN: 11 mg/dL (ref 7–22)
CALCIUM: 9.5 mg/dL (ref 8.0–10.3)
CO2: 25 meq/L (ref 18–33)
Chloride: 97 mEq/L — ABNORMAL LOW (ref 98–108)
Creat: 0.6 mg/dl (ref 0.6–1.2)
GLUCOSE: 143 mg/dL — AB (ref 73–118)
Potassium: 3.5 mEq/L (ref 3.3–4.7)
SODIUM: 134 meq/L (ref 128–145)
Total Protein: 8 g/dL (ref 6.4–8.1)

## 2015-07-18 LAB — CBC WITH DIFFERENTIAL (CANCER CENTER ONLY)
BASO#: 0.1 10*3/uL (ref 0.0–0.2)
BASO%: 0.2 % (ref 0.0–2.0)
EOS%: 0.3 % (ref 0.0–7.0)
Eosinophils Absolute: 0.1 10*3/uL (ref 0.0–0.5)
HCT: 38.4 % (ref 34.8–46.6)
HGB: 12.6 g/dL (ref 11.6–15.9)
LYMPH#: 2.8 10*3/uL (ref 0.9–3.3)
LYMPH%: 11 % — AB (ref 14.0–48.0)
MCH: 28.3 pg (ref 26.0–34.0)
MCHC: 32.8 g/dL (ref 32.0–36.0)
MCV: 86 fL (ref 81–101)
MONO#: 1.9 10*3/uL — ABNORMAL HIGH (ref 0.1–0.9)
MONO%: 7.6 % (ref 0.0–13.0)
NEUT#: 20.4 10*3/uL — ABNORMAL HIGH (ref 1.5–6.5)
NEUT%: 80.9 % — AB (ref 39.6–80.0)
PLATELETS: 412 10*3/uL — AB (ref 145–400)
RBC: 4.46 10*6/uL (ref 3.70–5.32)
RDW: 12.5 % (ref 11.1–15.7)
WBC: 25.2 10*3/uL — AB (ref 3.9–10.0)

## 2015-07-18 LAB — LACTATE DEHYDROGENASE (CC13): LDH: 277 U/L — AB (ref 125–245)

## 2015-07-18 MED ORDER — CEPHALEXIN 500 MG PO CAPS: 500.0000 mg | ORAL_CAPSULE | Freq: Four times a day (QID) | ORAL | Status: DC

## 2015-07-18 MED ORDER — HEPARIN SOD (PORK) LOCK FLUSH 100 UNIT/ML IV SOLN
500.0000 [IU] | Freq: Once | INTRAVENOUS | Status: AC | PRN
  Administered 2015-07-18: 500 [IU]
  Filled 2015-07-18: qty 5

## 2015-07-18 MED ORDER — PROCHLORPERAZINE MALEATE 10 MG PO TABS: 10.0000 mg | ORAL_TABLET | Freq: Once | ORAL | Status: DC

## 2015-07-18 MED ORDER — SODIUM CHLORIDE 0.9 % IJ SOLN
10.0000 mL | INTRAMUSCULAR | Status: DC | PRN
  Administered 2015-07-18: 10 mL
  Filled 2015-07-18: qty 10

## 2015-07-18 MED ORDER — SODIUM CHLORIDE 0.9 % IV SOLN
Freq: Once | INTRAVENOUS | Status: AC
  Administered 2015-07-18: 13:00:00 via INTRAVENOUS
  Filled 2015-07-18: qty 5

## 2015-07-18 MED ORDER — PALONOSETRON HCL INJECTION 0.25 MG/5ML
0.2500 mg | Freq: Once | INTRAVENOUS | Status: AC
  Administered 2015-07-18: 0.25 mg via INTRAVENOUS

## 2015-07-18 MED ORDER — PROCHLORPERAZINE MALEATE 10 MG PO TABS
ORAL_TABLET | ORAL | Status: AC
  Filled 2015-07-18: qty 1

## 2015-07-18 MED ORDER — SODIUM CHLORIDE 0.9 % IV SOLN
6.0000 mg/kg | INTRAVENOUS | Status: DC
  Administered 2015-07-18: 408 mg via INTRAVENOUS
  Filled 2015-07-18: qty 8.16

## 2015-07-18 MED ORDER — SODIUM CHLORIDE 0.9 % IV SOLN
2.0000 mg/kg | Freq: Once | INTRAVENOUS | Status: AC
  Administered 2015-07-18: 125 mg via INTRAVENOUS
  Filled 2015-07-18: qty 5

## 2015-07-18 MED ORDER — MORPHINE SULFATE 15 MG PO TABS: 15.0000 mg | ORAL_TABLET | Freq: Four times a day (QID) | ORAL | Status: DC | PRN

## 2015-07-18 MED ORDER — SODIUM CHLORIDE 0.9 % IV SOLN
Freq: Once | INTRAVENOUS | Status: AC
  Administered 2015-07-18: 12:00:00 via INTRAVENOUS

## 2015-07-18 NOTE — Progress Notes (Signed)
Hematology and Oncology Follow Up Visit  Felicia Richmond [id number] Dec 13, 1960 54 y.o. 07/18/2015   Principle Diagnosis:  Metastatic melanoma- BRAF (+) - progressive Current Therapy:    Pembrolizumab (59m/Kg) IV q 3 week - c#1     Interim History:  Felicia Richmond back for follow-up. She has had one cycle of pembrolizumab.  She tolerated this pretty well. Per she's having more pain under the right breast. She is on a Butrans patch. This is not helping the pain all that much area and she is not having any pain anywhere else.  She's had no issues with bleeding.  She is not eating all that much. I did put her on some Marinol. I don't think she really is taking this. She's had no diarrhea.  She has had no issues with leg swelling. She's had no headache. She's had no leg swelling.   She has had no cough.  She's not noted any rashes.  Overall, her performance status is ECOG 1.  Medications:  Current outpatient prescriptions:  .  atorvastatin (LIPITOR) 40 MG tablet, Take 1 tablet (40 mg total) by mouth daily., Disp: 30 tablet, Rfl: 3 .  buprenorphine (BUTRANS) 20 MCG/HR PTWK patch, Place 1 patch (20 mcg total) onto the skin once a week., Disp: 4 patch, Rfl: 0 .  dronabinol (MARINOL) 5 MG capsule, Take 1 capsule (5 mg total) by mouth 2 (two) times daily before a meal., Disp: 60 capsule, Rfl: 0 .  ibuprofen (ADVIL,MOTRIN) 200 MG tablet, Take 400 mg by mouth every 6 (six) hours as needed for mild pain., Disp: , Rfl:  .  lidocaine-prilocaine (EMLA) cream, Apply to affected area once, Disp: 30 g, Rfl: 3 .  LORazepam (ATIVAN) 0.5 MG tablet, Take 1 tablet (0.5 mg total) by mouth every 6 (six) hours as needed (Nausea or vomiting)., Disp: 30 tablet, Rfl: 0 .  Meclizine HCl 25 MG CHEW, Chew 25 mg by mouth daily as needed (dizziness). , Disp: , Rfl:  .  methimazole (TAPAZOLE) 5 MG tablet, Take 5 mg by mouth daily. , Disp: , Rfl:  .  ondansetron (ZOFRAN) 8 MG tablet, Take 1 tablet (8 mg total) by mouth 2  (two) times daily as needed (Nausea or vomiting)., Disp: 30 tablet, Rfl: 1 .  potassium chloride (K-DUR) 10 MEQ tablet, Take 1 tablet (10 mEq total) by mouth daily., Disp: 30 tablet, Rfl: 5 .  prochlorperazine (COMPAZINE) 10 MG tablet, Take 1 tablet (10 mg total) by mouth every 6 (six) hours as needed (Nausea or vomiting)., Disp: 30 tablet, Rfl: 1 .  cephALEXin (KEFLEX) 500 MG capsule, Take 1 capsule (500 mg total) by mouth 4 (four) times daily., Disp: 40 capsule, Rfl: 0 .  morphine (MSIR) 15 MG tablet, Take 1 tablet (15 mg total) by mouth every 6 (six) hours as needed for severe pain., Disp: 90 tablet, Rfl: 0  Current facility-administered medications:  .  DAPTOmycin (CUBICIN) 408 mg in sodium chloride 0.9 % IVPB, 6 mg/kg, Intravenous, Q24H, PVolanda Napoleon MD, Stopped at 07/18/15 1301  Facility-Administered Medications Ordered in Other Visits:  .  sodium chloride 0.9 % injection 10 mL, 10 mL, Intracatheter, PRN, PVolanda Napoleon MD, 10 mL at 07/18/15 1446  Allergies:  Allergies  Allergen Reactions  . Cotellic [Cobimetinib] Diarrhea  . Oxycodone Nausea And Vomiting  . Pseudoephedrine Other (See Comments)    Makes patient feel weird  . Scopolamine Other (See Comments)  . Tramadol     Other reaction(s): GI Upset (  intolerance)    Past Medical History, Surgical history, Social history, and Family History were reviewed and updated.  Review of Systems: As above  Physical Exam:  height is _0  (1.575 m) and weight is 150 lb (68.04 kg). Her oral temperature is 98.3 F (36.8 C). Her blood pressure is 139/48 and her pulse is 112. Her respiration is 18.   Wt Readings from Last 3 Encounters:  07/18/15 150 lb (68.04 kg)  07/10/15 151 lb 9.6 oz (68.765 kg)  06/26/15 149 lb (67.586 kg)     Well-developed and well-nourished white female in no obvious distress. Head and neck exam shows no ocular or oral lesions. There are no palpable cervical or supraclavicular lymph nodes. Lungs are clear.  Cardiac exam regular rate and rhythm with no murmurs, rubs or bruits. Right breast shows an area of erythema and firmness under the right breast. This is somewhat tender to palpation. There is no discharge. Axillary exam shows no bilateral axillary adenopathy. Abdomen is soft. She has good bowel sounds. There is no fluid wave. There is no palpable liver or spleen tip. Back exam shows no tenderness over the spine, ribs or hips. Extremities shows no clubbing, cyanosis or edema. Neurological exam shows no focal neurological deficits. Skin exam shows no rashes, ecchymoses or petechia.  Lab Results  Component Value Date   WBC 25.2* 07/18/2015   HGB 12.6 07/18/2015   HCT 38.4 07/18/2015   MCV 86 07/18/2015   PLT 412* 07/18/2015     Chemistry      Component Value Date/Time   NA 134 07/18/2015 1048   NA 139 06/08/2015 1035   K 3.5 07/18/2015 1048   K 4.1 06/08/2015 1035   CL 97* 07/18/2015 1048   CL 103 06/08/2015 1035   CO2 25 07/18/2015 1048   CO2 26 06/08/2015 1035   BUN 11 07/18/2015 1048   BUN 7 06/08/2015 1035   CREATININE 0.6 07/18/2015 1048   CREATININE 0.81 06/08/2015 1035      Component Value Date/Time   CALCIUM 9.5 07/18/2015 1048   CALCIUM 9.8 06/08/2015 1035   ALKPHOS 99* 07/18/2015 1048   ALKPHOS 157* 06/08/2015 1035   AST 16 07/18/2015 1048   AST 13 06/08/2015 1035   ALT <5* 07/18/2015 1048   ALT 10 06/08/2015 1035   BILITOT 0.70 07/18/2015 1048   BILITOT 1.0 06/08/2015 1035         Impression and Plan: Felicia Richmond is 54 year old white female with metastatic melanoma.   She has only had 1 cycle of immunotherapy. I think everything looks relatively stable.  One possibility for this area under the right breast would be intralesional therapy with T-VEC. This is a viral based therapy.  I want to try her on some antibiotics. I wonder give her a dose of Cubicin in the office. I will then put her on Keflex as an outpatient. Maybe this might help take care of some of the  erythema and possible cellulitis.  I'm also changing some of her antiemetics. I'll give her some Aloxi and Emend. This might provide some antibiotic coverage for the next 4- 5 days.   I'll like to get her back in 3 weeks. We can see her back sooner if she has any issues.  I spent about 35 minutes with her and her husband today.  Volanda Napoleon, MD 11/23/20164:06 PM

## 2015-07-18 NOTE — Patient Instructions (Signed)
Pembrolizumab injection What is this medicine? PEMBROLIZUMAB (pem broe liz ue mab) is a monoclonal antibody. It is used to treat melanoma and non-small cell lung cancer. This medicine may be used for other purposes; ask your health care provider or pharmacist if you have questions. What should I tell my health care provider before I take this medicine? They need to know if you have any of these conditions: -diabetes -immune system problems -inflammatory bowel disease -liver disease -lung or breathing disease -lupus -an unusual or allergic reaction to pembrolizumab, other medicines, foods, dyes, or preservatives -pregnant or trying to get pregnant -breast-feeding How should I use this medicine? This medicine is for infusion into a vein. It is given by a health care professional in a hospital or clinic setting. A special MedGuide will be given to you before each treatment. Be sure to read this information carefully each time. Talk to your pediatrician regarding the use of this medicine in children. Special care may be needed. Overdosage: If you think you have taken too much of this medicine contact a poison control center or emergency room at once. NOTE: This medicine is only for you. Do not share this medicine with others. What if I miss a dose? It is important not to miss your dose. Call your doctor or health care professional if you are unable to keep an appointment. What may interact with this medicine? Interactions have not been studied. Give your health care provider a list of all the medicines, herbs, non-prescription drugs, or dietary supplements you use. Also tell them if you smoke, drink alcohol, or use illegal drugs. Some items may interact with your medicine. This list may not describe all possible interactions. Give your health care provider a list of all the medicines, herbs, non-prescription drugs, or dietary supplements you use. Also tell them if you smoke, drink alcohol, or  use illegal drugs. Some items may interact with your medicine. What should I watch for while using this medicine? Your condition will be monitored carefully while you are receiving this medicine. You may need blood work done while you are taking this medicine. Do not become pregnant while taking this medicine or for 4 months after stopping it. Women should inform their doctor if they wish to become pregnant or think they might be pregnant. There is a potential for serious side effects to an unborn child. Talk to your health care professional or pharmacist for more information. Do not breast-feed an infant while taking this medicine or for 4 months after the last dose. What side effects may I notice from receiving this medicine? Side effects that you should report to your doctor or health care professional as soon as possible: -allergic reactions like skin rash, itching or hives, swelling of the face, lips, or tongue -bloody or black, tarry stools -breathing problems -change in the amount of urine -changes in vision -chest pain -chills -dark urine -dizziness or feeling faint or lightheaded -fast or irregular heartbeat -fever -flushing -hair loss -muscle pain -muscle weakness -persistent headache -signs and symptoms of high blood sugar such as dizziness; dry mouth; dry skin; fruity breath; nausea; stomach pain; increased hunger or thirst; increased urination -signs and symptoms of liver injury like dark urine, light-colored stools, loss of appetite, nausea, right upper belly pain, yellowing of the eyes or skin -stomach pain -weight loss Side effects that usually do not require medical attention (Report these to your doctor or health care professional if they continue or are bothersome.):constipation -cough -diarrhea -joint pain -  tiredness This list may not describe all possible side effects. Call your doctor for medical advice about side effects. You may report side effects to FDA at  1-800-FDA-1088. Where should I keep my medicine? This drug is given in a hospital or clinic and will not be stored at home. NOTE: This sheet is a summary. It may not cover all possible information. If you have questions about this medicine, talk to your doctor, pharmacist, or health care provider.    2016, Elsevier/[employer] Standard. (2014-10-10 17:24:19)  

## 2015-07-27 ENCOUNTER — Emergency Department (HOSPITAL_BASED_OUTPATIENT_CLINIC_OR_DEPARTMENT_OTHER): Payer: BLUE CROSS/BLUE SHIELD

## 2015-07-27 ENCOUNTER — Encounter (HOSPITAL_BASED_OUTPATIENT_CLINIC_OR_DEPARTMENT_OTHER): Payer: Self-pay | Admitting: *Deleted

## 2015-07-27 ENCOUNTER — Emergency Department (HOSPITAL_BASED_OUTPATIENT_CLINIC_OR_DEPARTMENT_OTHER)
Admission: EM | Admit: 2015-07-27 | Discharge: 2015-07-27 | Disposition: A | Discharge status: Home / Self Care | Payer: BLUE CROSS/BLUE SHIELD | Attending: Emergency Medicine | Admitting: Emergency Medicine

## 2015-07-27 ENCOUNTER — Encounter: Payer: Self-pay | Admitting: Nurse Practitioner

## 2015-07-27 DIAGNOSIS — Z792 Long term (current) use of antibiotics: Secondary | ICD-10-CM | POA: Insufficient documentation

## 2015-07-27 DIAGNOSIS — F17210 Nicotine dependence, cigarettes, uncomplicated: Secondary | ICD-10-CM | POA: Diagnosis not present

## 2015-07-27 DIAGNOSIS — Z79899 Other long term (current) drug therapy: Secondary | ICD-10-CM | POA: Insufficient documentation

## 2015-07-27 DIAGNOSIS — J439 Emphysema, unspecified: Secondary | ICD-10-CM | POA: Diagnosis not present

## 2015-07-27 DIAGNOSIS — R079 Chest pain, unspecified: Secondary | ICD-10-CM | POA: Insufficient documentation

## 2015-07-27 DIAGNOSIS — C792 Secondary malignant neoplasm of skin: Secondary | ICD-10-CM | POA: Diagnosis not present

## 2015-07-27 DIAGNOSIS — Z8669 Personal history of other diseases of the nervous system and sense organs: Secondary | ICD-10-CM | POA: Diagnosis not present

## 2015-07-27 DIAGNOSIS — R1084 Generalized abdominal pain: Secondary | ICD-10-CM | POA: Insufficient documentation

## 2015-07-27 DIAGNOSIS — I10 Essential (primary) hypertension: Secondary | ICD-10-CM | POA: Insufficient documentation

## 2015-07-27 DIAGNOSIS — C439 Malignant melanoma of skin, unspecified: Secondary | ICD-10-CM

## 2015-07-27 DIAGNOSIS — Z8673 Personal history of transient ischemic attack (TIA), and cerebral infarction without residual deficits: Secondary | ICD-10-CM | POA: Diagnosis not present

## 2015-07-27 DIAGNOSIS — E785 Hyperlipidemia, unspecified: Secondary | ICD-10-CM | POA: Insufficient documentation

## 2015-07-27 DIAGNOSIS — C799 Secondary malignant neoplasm of unspecified site: Secondary | ICD-10-CM

## 2015-07-27 LAB — URINE MICROSCOPIC-ADD ON

## 2015-07-27 LAB — CBC WITH DIFFERENTIAL/PLATELET
Basophils Absolute: 0 10*3/uL (ref 0.0–0.1)
Basophils Relative: 0 %
EOS ABS: 0.5 10*3/uL (ref 0.0–0.7)
Eosinophils Relative: 2 %
HCT: 37 % (ref 36.0–46.0)
Hemoglobin: 12.1 g/dL (ref 12.0–15.0)
Lymphocytes Relative: 14 %
Lymphs Abs: 3.6 10*3/uL (ref 0.7–4.0)
MCH: 27.8 pg (ref 26.0–34.0)
MCHC: 32.7 g/dL (ref 30.0–36.0)
MCV: 84.9 fL (ref 78.0–100.0)
MONO ABS: 1 10*3/uL (ref 0.1–1.0)
Monocytes Relative: 4 %
NEUTROS PCT: 80 %
Neutro Abs: 20.6 10*3/uL — ABNORMAL HIGH (ref 1.7–7.7)
Platelets: 501 10*3/uL — ABNORMAL HIGH (ref 150–400)
RBC: 4.36 MIL/uL (ref 3.87–5.11)
RDW: 12.6 % (ref 11.5–15.5)
WBC: 25.7 10*3/uL — AB (ref 4.0–10.5)

## 2015-07-27 LAB — COMPREHENSIVE METABOLIC PANEL
ALK PHOS: 115 U/L (ref 38–126)
ALT: 13 U/L — ABNORMAL LOW (ref 14–54)
ANION GAP: 9 (ref 5–15)
AST: 15 U/L (ref 15–41)
Albumin: 3.1 g/dL — ABNORMAL LOW (ref 3.5–5.0)
BILIRUBIN TOTAL: 0.4 mg/dL (ref 0.3–1.2)
BUN: 12 mg/dL (ref 6–20)
CALCIUM: 8.7 mg/dL — AB (ref 8.9–10.3)
CO2: 24 mmol/L (ref 22–32)
Chloride: 98 mmol/L — ABNORMAL LOW (ref 101–111)
Creatinine, Ser: 0.6 mg/dL (ref 0.44–1.00)
GFR calc Af Amer: >60 mL/min (ref >60)
Glucose, Bld: 110 mg/dL — ABNORMAL HIGH (ref 65–99)
POTASSIUM: 3.7 mmol/L (ref 3.5–5.1)
Sodium: 131 mmol/L — ABNORMAL LOW (ref 135–145)
TOTAL PROTEIN: 7 g/dL (ref 6.5–8.1)

## 2015-07-27 LAB — URINALYSIS, ROUTINE W REFLEX MICROSCOPIC
GLUCOSE, UA: NEGATIVE mg/dL
HGB URINE DIPSTICK: NEGATIVE
KETONES UR: NEGATIVE mg/dL
Nitrite: NEGATIVE
PROTEIN: NEGATIVE mg/dL
Specific Gravity, Urine: 1.023 (ref 1.005–1.030)
pH: 5.5 (ref 5.0–8.0)

## 2015-07-27 LAB — TROPONIN I

## 2015-07-27 LAB — I-STAT CG4 LACTIC ACID, ED: Lactic Acid, Venous: 0.73 mmol/L (ref 0.5–2.0)

## 2015-07-27 LAB — LIPASE, BLOOD: Lipase: 21 U/L (ref 11–51)

## 2015-07-27 MED ORDER — IOHEXOL 350 MG/ML SOLN
100.0000 mL | Freq: Once | INTRAVENOUS | Status: AC | PRN
Start: 2015-07-27 — End: 2015-07-27
  Administered 2015-07-27: 100 mL via INTRAVENOUS

## 2015-07-27 MED ORDER — SODIUM CHLORIDE 0.9 % IV BOLUS (SEPSIS)
1000.0000 mL | Freq: Once | INTRAVENOUS | Status: AC
  Administered 2015-07-27: 1000 mL via INTRAVENOUS

## 2015-07-27 MED ORDER — ONDANSETRON HCL 4 MG/2ML IJ SOLN
4.0000 mg | Freq: Once | INTRAMUSCULAR | Status: AC
  Administered 2015-07-27: 4 mg via INTRAVENOUS
  Filled 2015-07-27: qty 2

## 2015-07-27 MED ORDER — MORPHINE SULFATE (PF) 4 MG/ML IV SOLN
4.0000 mg | INTRAVENOUS | Status: DC | PRN
  Administered 2015-07-27: 4 mg via INTRAVENOUS
  Filled 2015-07-27: qty 1

## 2015-07-27 NOTE — ED Provider Notes (Signed)
CSN: [id number]     Arrival date & time 07/27/15  1252 History   First MD Initiated Contact with Patient 07/27/15 1316     Chief Complaint  Patient presents with  . Chest Pain     (Consider location/radiation/quality/duration/timing/severity/associated sxs/prior Treatment) HPI 54 year old female who presents with chest pain and abdominal pain. She has a history of hypertension, hyperlipidemia, emphysema, and stage IV metastatic melanoma to the lung and abdomen currently receiving immunomodulator therapy. For the past 5 days has had constant worsening abdominal pain, generalized, worst over the right hemiabdomen. It is not associated with vomiting, diarrhea, melena, hematochezia, dysuria, urinary frequency, or back pain. She has had persistent nausea with occasional dry heaving, but tolerating by mouth intake at home. This morning also woke up with chest pain, initially describes chest pressure in the central chest with sharp component with deep inspiration. It is associated with shortness of breath and dyspnea on exertion. No leg pain or swelling, no orthopnea or PND. No cough, congestion, sore throat, runny nose, fever or known sick contacts.  Currently taking oral antibiotics in setting of skin infection from biopsy site of right base of breast.    Past Medical History  Diagnosis Date  . Thyroid disease   . Cancer ([place]) 08/25/14    metastatic melanoma; stage IV  . Emphysema of lung ([place])   . Glaucoma   . Stroke ([place])   . Meniere's disease   . Splenic infarct   . Hyperlipidemia 04/26/2015  . Hypertension    Past Surgical History  Procedure Laterality Date  . Cholecystectomy  1992   Family History  Problem Relation Age of Onset  . Thyroid disease Mother   . Thyroid disease Sister   . Cancer Sister   . Heart attack Brother    Social History  Substance Use Topics  . Smoking status: Current Every Day Smoker -- 1.00 packs/day for 36 years    Types: Cigarettes  . Smokeless tobacco:  Never Used  . Alcohol Use: No   OB History    No data available     Review of Systems 10/14 systems reviewed and are negative other than those stated in the HPI    Allergies  Cotellic; Oxycodone; Pseudoephedrine; Scopolamine; and Tramadol  Home Medications   Prior to Admission medications   Medication Sig Start Date End Date Taking? Authorizing Provider  atorvastatin (LIPITOR) 40 MG tablet Take 1 tablet (40 mg total) by mouth daily. 04/27/15   Debbrah Alar, NP  buprenorphine (BUTRANS) 20 MCG/HR PTWK patch Place 1 patch (20 mcg total) onto the skin once a week. 07/10/15   Volanda Napoleon, MD  cephALEXin (KEFLEX) 500 MG capsule Take 1 capsule (500 mg total) by mouth 4 (four) times daily. 07/18/15   Volanda Napoleon, MD  dronabinol (MARINOL) 5 MG capsule Take 1 capsule (5 mg total) by mouth 2 (two) times daily before a meal. 06/15/15   Volanda Napoleon, MD  ibuprofen (ADVIL,MOTRIN) 200 MG tablet Take 400 mg by mouth every 6 (six) hours as needed for mild pain.    Historical Provider, MD  lidocaine-prilocaine (EMLA) cream Apply to affected area once 06/26/15   Volanda Napoleon, MD  LORazepam (ATIVAN) 0.5 MG tablet Take 1 tablet (0.5 mg total) by mouth every 6 (six) hours as needed (Nausea or vomiting). 06/26/15   Volanda Napoleon, MD  Meclizine HCl 25 MG CHEW Chew 25 mg by mouth daily as needed (dizziness).     Historical Provider, MD  methimazole (TAPAZOLE) 5 MG tablet Take 5 mg by mouth daily.  09/01/14   Historical Provider, MD  morphine (MSIR) 15 MG tablet Take 1 tablet (15 mg total) by mouth every 6 (six) hours as needed for severe pain. 07/18/15   Volanda Napoleon, MD  ondansetron (ZOFRAN) 8 MG tablet Take 1 tablet (8 mg total) by mouth 2 (two) times daily as needed (Nausea or vomiting). 06/26/15   Volanda Napoleon, MD  potassium chloride (K-DUR) 10 MEQ tablet Take 1 tablet (10 mEq total) by mouth daily. 06/04/15   Debbrah Alar, NP  prochlorperazine (COMPAZINE) 10 MG tablet Take 1  tablet (10 mg total) by mouth every 6 (six) hours as needed (Nausea or vomiting). 06/26/15   Volanda Napoleon, MD   BP 109/48 mmHg  Pulse 102  Temp(Src) 97.8 F (36.6 C) (Oral)  Resp 18  Ht 5\' 1"  (1.549 m)  Wt 150 lb (68.04 kg)  BMI 28.36 kg/m2  SpO2 93%  LMP 08/26/2007 Physical Exam Physical Exam  Nursing note and vitals reviewed. Constitutional: Chronically ill-appearing, well nourished, non-toxic, and in no acute distress Head: Normocephalic and atraumatic.  Mouth/Throat: Oropharynx is clear and moist.  Neck: Normal range of motion. Neck supple.  Cardiovascular: Normal rate and regular rhythm.   no edema. Pulmonary/Chest: Effort normal and breath sounds normal.  Abdominal: Soft. Obese, nondistended. Diffuse tenderness to palpation, worse in the right hemiabdomen. There is no rebound and no guarding.  no CVA tenderness  Musculoskeletal: Normal range of motion.  Neurological: Alert, no facial droop, fluent speech, moves all extremities symmetrically Skin: Skin is warm and dry.  Psychiatric: Cooperative  ED Course  Procedures (including critical care time) Labs Review Labs Reviewed  CBC WITH DIFFERENTIAL/PLATELET - Abnormal; Notable for the following:    WBC 25.7 (*)    Platelets 501 (*)    Neutro Abs 20.6 (*)    All other components within normal limits  COMPREHENSIVE METABOLIC PANEL - Abnormal; Notable for the following:    Sodium 131 (*)    Chloride 98 (*)    Glucose, Bld 110 (*)    Calcium 8.7 (*)    Albumin 3.1 (*)    ALT 13 (*)    All other components within normal limits  URINALYSIS, ROUTINE W REFLEX MICROSCOPIC (NOT AT [facility]) - Abnormal; Notable for the following:    Color, Urine AMBER (*)    APPearance CLOUDY (*)    Bilirubin Urine SMALL (*)    Leukocytes, UA SMALL (*)    All other components within normal limits  URINE MICROSCOPIC-ADD ON - Abnormal; Notable for the following:    Squamous Epithelial / LPF 0-5 (*)    Bacteria, UA MANY (*)    All other  components within normal limits  URINE CULTURE  TROPONIN I  LIPASE, BLOOD  TROPONIN I  I-STAT CG4 LACTIC ACID, ED  I-STAT CG4 LACTIC ACID, ED    Imaging Review Dg Chest 2 View  07/27/2015  CLINICAL DATA:  Chest pain.  History of melanoma EXAM: CHEST  2 VIEW COMPARISON:  Chest CT June 14, 2015 FINDINGS: Port-A-Cath tip is in the superior vena cava. No pneumothorax. Soft tissue prominence in the anterior mediastinum is concerning for adenopathy in this area. This finding is better seen on the lateral view. There is borderline right hilar fullness as well. There is no edema or consolidation. The heart size and pulmonary vascularity normal. No bone lesions. IMPRESSION: Suspect anterior mediastinal and right hilar adenopathy. No edema  or consolidation. No pneumothorax. Electronically Signed   By: Lowella Grip III M.D.   On: 07/27/2015 14:25   Ct Angio Chest Pe W/cm &/or Wo Cm  07/27/2015  CLINICAL DATA:  Chest and abdomen pain. History of stage IV metastatic melanoma to the lung and abdomen. Currently undergoing immunomodulator therapy. The abdominal pain is generalized, worse over the right abdomen. Persistent nausea. EXAM: CT ANGIOGRAPHY CHEST CT ABDOMEN AND PELVIS WITH CONTRAST TECHNIQUE: Multidetector CT imaging of the chest was performed using the standard protocol during bolus administration of intravenous contrast. Multiplanar CT image reconstructions and MIPs were obtained to evaluate the vascular anatomy. Multidetector CT imaging of the abdomen and pelvis was performed using the standard protocol during bolus administration of intravenous contrast. CONTRAST:  158mL OMNIPAQUE IOHEXOL 350 MG/ML SOLN COMPARISON:  Chest, abdomen and pelvis CT dated 06/14/2015 FINDINGS: CTA CHEST FINDINGS The previously demonstrated 2.3 cm right thyroid nodule currently measures 2.8 cm in corresponding diameter on image number 6. A previously demonstrated 2.9 cm anterior superior mediastinal mass currently  measures 4.3 cm in corresponding diameter on image number 33. A previously demonstrated 1.5 cm short axis left hilar node measures 1.8 cm in corresponding diameter on image number 33. A previously demonstrated 1.2 cm short axis right hilar lymph node measures 1.5 cm in short axis diameter on image number 33. A previously demonstrated 4.1 cm left axillary mass measures 4.9 cm in corresponding diameter on image number 26. A previously demonstrated 1.6 cm anterior left axillary mass, lateral to the pectoralis major muscle, measures 2.3 cm in corresponding diameter today on image number 26. A currently demonstrated 1.0 cm short axis right axillary node on image number 34 had a previous short axis diameter of 0.6 cm. The thoracic aorta is normal in caliber with no dissection or aneurysm seen. No pulmonary arterial filling defects are seen. A right jugular porta catheter is in place. Mild diffuse peribronchial thickening. No lung nodules seen. A previously demonstrated 4.1 cm right lower outer quadrant breast mass currently measures 5.3 cm in corresponding diameter on image number 66. Mild thoracic spine degenerative changes. CT ABDOMEN and PELVIS FINDINGS A previously demonstrated 5.6 cm right adrenal mass currently measures 8.1 cm in corresponding diameter on image number 21. A previously demonstrated 8.9 cm left adrenal mass currently measures 9.2 cm on image number 22. A previously demonstrated 4.7 cm mass in the left mesenteric root currently measures 6.7 cm on image number 32. There is an interval left medial diaphragmatic for a mass with a maximum diameter of 1.7 cm on image 13. A previously demonstrated 2.6 cm left rectus abdominus muscle mass currently measures 2.9 cm in corresponding diameter on image number 42. A previously demonstrated 2.9 cm eccentric proximal transverse colon wall mass currently measures 3.7 cm in corresponding diameter on image number 51. There has been an interval development of a a 0.9 cm  mesenteric or omental mass more posteriorly at that level on image number 51. A previously demonstrated 1.1 cm mass anterior to the right colon measures 1.7 cm on image number 60. A previously demonstrated 2.7 cm mid small bowel mass on the left, is not currently identified. A previously demonstrated 1.2 cm left upper pelvic mesenteric mass currently measures 2.3 cm on image number 60. A previously demonstrated 1.3 cm left pelvic anterior mesenteric mass currently measures 1.4 cm on image number 64. Cholecystectomy clips. The pancreatic tail was abutting the left mesenteric root mass with possible involvement by the mass. Unremarkable spleen, liver, kidneys,  ureters, urinary bladder, uterus and ovaries. Mild lumbar spine degenerative changes. Review of the MIP images confirms the above findings. IMPRESSION: 1. No acute abnormality. 2. Progressive metastatic disease involving the chest, abdomen and pelvis, as described above. Electronically Signed   By: Claudie Revering M.D.   On: 07/27/2015 16:05   Ct Abdomen Pelvis W Contrast  07/27/2015  CLINICAL DATA:  Chest and abdomen pain. History of stage IV metastatic melanoma to the lung and abdomen. Currently undergoing immunomodulator therapy. The abdominal pain is generalized, worse over the right abdomen. Persistent nausea. EXAM: CT ANGIOGRAPHY CHEST CT ABDOMEN AND PELVIS WITH CONTRAST TECHNIQUE: Multidetector CT imaging of the chest was performed using the standard protocol during bolus administration of intravenous contrast. Multiplanar CT image reconstructions and MIPs were obtained to evaluate the vascular anatomy. Multidetector CT imaging of the abdomen and pelvis was performed using the standard protocol during bolus administration of intravenous contrast. CONTRAST:  154mL OMNIPAQUE IOHEXOL 350 MG/ML SOLN COMPARISON:  Chest, abdomen and pelvis CT dated 06/14/2015 FINDINGS: CTA CHEST FINDINGS The previously demonstrated 2.3 cm right thyroid nodule currently measures  2.8 cm in corresponding diameter on image number 6. A previously demonstrated 2.9 cm anterior superior mediastinal mass currently measures 4.3 cm in corresponding diameter on image number 33. A previously demonstrated 1.5 cm short axis left hilar node measures 1.8 cm in corresponding diameter on image number 33. A previously demonstrated 1.2 cm short axis right hilar lymph node measures 1.5 cm in short axis diameter on image number 33. A previously demonstrated 4.1 cm left axillary mass measures 4.9 cm in corresponding diameter on image number 26. A previously demonstrated 1.6 cm anterior left axillary mass, lateral to the pectoralis major muscle, measures 2.3 cm in corresponding diameter today on image number 26. A currently demonstrated 1.0 cm short axis right axillary node on image number 34 had a previous short axis diameter of 0.6 cm. The thoracic aorta is normal in caliber with no dissection or aneurysm seen. No pulmonary arterial filling defects are seen. A right jugular porta catheter is in place. Mild diffuse peribronchial thickening. No lung nodules seen. A previously demonstrated 4.1 cm right lower outer quadrant breast mass currently measures 5.3 cm in corresponding diameter on image number 66. Mild thoracic spine degenerative changes. CT ABDOMEN and PELVIS FINDINGS A previously demonstrated 5.6 cm right adrenal mass currently measures 8.1 cm in corresponding diameter on image number 21. A previously demonstrated 8.9 cm left adrenal mass currently measures 9.2 cm on image number 22. A previously demonstrated 4.7 cm mass in the left mesenteric root currently measures 6.7 cm on image number 32. There is an interval left medial diaphragmatic for a mass with a maximum diameter of 1.7 cm on image 13. A previously demonstrated 2.6 cm left rectus abdominus muscle mass currently measures 2.9 cm in corresponding diameter on image number 42. A previously demonstrated 2.9 cm eccentric proximal transverse colon wall  mass currently measures 3.7 cm in corresponding diameter on image number 51. There has been an interval development of a a 0.9 cm mesenteric or omental mass more posteriorly at that level on image number 51. A previously demonstrated 1.1 cm mass anterior to the right colon measures 1.7 cm on image number 60. A previously demonstrated 2.7 cm mid small bowel mass on the left, is not currently identified. A previously demonstrated 1.2 cm left upper pelvic mesenteric mass currently measures 2.3 cm on image number 60. A previously demonstrated 1.3 cm left pelvic anterior mesenteric mass  currently measures 1.4 cm on image number 64. Cholecystectomy clips. The pancreatic tail was abutting the left mesenteric root mass with possible involvement by the mass. Unremarkable spleen, liver, kidneys, ureters, urinary bladder, uterus and ovaries. Mild lumbar spine degenerative changes. Review of the MIP images confirms the above findings. IMPRESSION: 1. No acute abnormality. 2. Progressive metastatic disease involving the chest, abdomen and pelvis, as described above. Electronically Signed   By: Claudie Revering M.D.   On: 07/27/2015 16:05   I have personally reviewed and evaluated these images and lab results as part of my medical decision-making.   EKG Interpretation  Date/Time:  Friday July 27 2015 16:43:07 EST Ventricular Rate:  103 PR Interval:  138 QRS Duration: 76 QT Interval:  346 QTC Calculation: 453 R Axis:   70 Text Interpretation:  Sinus tachycardia Low voltage QRS Borderline ECG No significant change since last tracing Confirmed by Antoino Westhoff MD, Hinton Dyer [id number]) on 07/27/2015 4:47:39 PM       MDM   Final diagnoses:  Chest pain  Generalized abdominal pain  Metastatic melanoma ([place])    54 year old female with history of metastatic melanoma who presents with abdominal pain and chest pain. She is afebrile and hemodynamically stable. Abdomen is soft and and non-peritoneal but with diffuse generalized  tenderness. Cardiopulmonary exam is unremarkable. EKG with sinus tachycardia without strain or ischemia. Serial troponins negative. Pain atypical of that of ACS, and heart score of 3 with overall low risk of ACS. Felt adequately ruled out given ongoing pain since this morning with persistently negative troponins and EKG without dynamic changes.    CT PE Chest  with enlarging superior mediastinal mass, likely the etiology of her chest pressure and pleuritic pain. No evidence of PE, infection, or other acute intrathoracic processes. CT abd/pelvis with overall increased tumor burden and no other acute intraabdominal processes. Her symptoms today likely due to increased tumor burden throughout her thorax and abdomen. Blood work revealing leukocytosis, seen on prior blood work this week, and she is also currently on antibiotics for treatment of skin infection around her biopsy site of the right breast. There is mild erythema around this site and she states that her cellulitis has been improving on antibiotics. No fever or other systemic signs of infection and no abscess on CT. She has normal lactate and remainder of blood work overall unremarkable.  I discussed these findings with oncology, who will set up close follow-up with Dr. Earley Favor on Monday. Patient is able to tolerate PO intake after antiemtics and analgesics. Strict return and follow-up instructions reviewed. She expressed understanding of all discharge instructions and felt comfortable with the plan of care.     Forde Dandy, MD 07/29/15 [phone]

## 2015-07-27 NOTE — Progress Notes (Signed)
Patient called and stated she was having severe chest pain and she is going to go to her local ER. When asked what the pain felt like she state pressure and discomfort with waves of nausea. She denies any heartburn or indigestion.

## 2015-07-27 NOTE — ED Notes (Signed)
Pt. Has history of metastatic melanoma and today reports she is feeling chest tightness and pain in her chest with deep inspiration.  Pt. Reports no nausea or vomiting.   Pt is on chemo/immuno therapy in [place] center up stairs at [facility].

## 2015-07-27 NOTE — ED Notes (Signed)
Pain across her chest this am. She has been having abdominal pain since Monday. Hx of adrenal cancer. She has a porta cath.

## 2015-07-27 NOTE — Discharge Instructions (Signed)
Your oncologist will call you on Monday or Tuesday to set up a close follow-up appointment. Return without fail for worsening symptoms including vomiting unable to tolerate food or fluids, fever, worsening pain, difficulty breathing, or any other symptoms concerning to you.    Abdominal Pain, Adult Many things can cause abdominal pain. Usually, abdominal pain is not caused by a disease and will improve without treatment. It can often be observed and treated at home. Your health care provider will do a physical exam and possibly order blood tests and X-rays to help determine the seriousness of your pain. However, in many cases, more time must pass before a clear cause of the pain can be found. Before that point, your health care provider may not know if you need more testing or further treatment. HOME CARE INSTRUCTIONS Monitor your abdominal pain for any changes. The following actions may help to alleviate any discomfort you are experiencing:  Only take over-the-counter or prescription medicines as directed by your health care provider.  Do not take laxatives unless directed to do so by your health care provider.  Try a clear liquid diet (broth, tea, or water) as directed by your health care provider. Slowly move to a bland diet as tolerated. SEEK MEDICAL CARE IF:  You have unexplained abdominal pain.  You have abdominal pain associated with nausea or diarrhea.  You have pain when you urinate or have a bowel movement.  You experience abdominal pain that wakes you in the night.  You have abdominal pain that is worsened or improved by eating food.  You have abdominal pain that is worsened with eating fatty foods.  You have a fever. SEEK IMMEDIATE MEDICAL CARE IF:  Your pain does not go away within 2 hours.  You keep throwing up (vomiting).  Your pain is felt only in portions of the abdomen, such as the right side or the left lower portion of the abdomen.  You pass bloody or black  tarry stools. MAKE SURE YOU:  Understand these instructions.  Will watch your condition.  Will get help right away if you are not doing well or get worse.   This information is not intended to replace advice given to you by your health care provider. Make sure you discuss any questions you have with your health care provider.   Document Released: 05/21/2005 Document Revised: 05/02/2015 Document Reviewed: 04/20/2013 Elsevier Interactive Patient Education [employer].

## 2015-07-29 LAB — URINE CULTURE

## 2015-07-31 ENCOUNTER — Telehealth: Payer: Self-pay | Admitting: Hematology & Oncology

## 2015-07-31 NOTE — Telephone Encounter (Signed)
I just spoke to Jerome and she advised [employer], they are billable codes.    Ortley KEYTRUDA - Approved 07/31/2015 - 10/29/2015; Auth: [id number] (pre-determination) [id number]   Dx: [id number]   RefOH:[id number] RefED:[id number]  P: [id number] F: [phone]        08/07/2015 Note:  Pt in office today to apply for financial assistance thru [employer] program.

## 2015-08-02 ENCOUNTER — Ambulatory Visit: Payer: BLUE CROSS/BLUE SHIELD | Admitting: Internal Medicine

## 2015-08-03 ENCOUNTER — Telehealth: Payer: Self-pay | Admitting: *Deleted

## 2015-08-03 NOTE — Telephone Encounter (Signed)
Patient c/o pain resolved with MSIR, however it's only effective for about 4hrs, not the 6hr interval it's prescribed for. Spoke to Dr Marin Olp who wants patient to start taking the MSIR q4hrs prn pain. Patient also c/o constipation. Instructed patient to start using Miralax as directed on the bottle. She is to take BID until she is more regular then decrease to daily. She understands.

## 2015-08-07 ENCOUNTER — Ambulatory Visit (HOSPITAL_BASED_OUTPATIENT_CLINIC_OR_DEPARTMENT_OTHER): Payer: BLUE CROSS/BLUE SHIELD

## 2015-08-07 ENCOUNTER — Encounter: Payer: Self-pay | Admitting: Hematology & Oncology

## 2015-08-07 ENCOUNTER — Encounter: Payer: Self-pay | Admitting: Family

## 2015-08-07 ENCOUNTER — Other Ambulatory Visit (HOSPITAL_BASED_OUTPATIENT_CLINIC_OR_DEPARTMENT_OTHER): Payer: BLUE CROSS/BLUE SHIELD

## 2015-08-07 ENCOUNTER — Ambulatory Visit (HOSPITAL_BASED_OUTPATIENT_CLINIC_OR_DEPARTMENT_OTHER): Payer: BLUE CROSS/BLUE SHIELD | Admitting: Family

## 2015-08-07 VITALS — BP 139/54 | HR 113 | Temp 97.3°F | Resp 14 | Ht 61.0 in | Wt 141.0 lb

## 2015-08-07 DIAGNOSIS — C439 Malignant melanoma of skin, unspecified: Secondary | ICD-10-CM

## 2015-08-07 DIAGNOSIS — C799 Secondary malignant neoplasm of unspecified site: Secondary | ICD-10-CM

## 2015-08-07 DIAGNOSIS — K5900 Constipation, unspecified: Secondary | ICD-10-CM | POA: Diagnosis not present

## 2015-08-07 DIAGNOSIS — R112 Nausea with vomiting, unspecified: Secondary | ICD-10-CM

## 2015-08-07 DIAGNOSIS — N61 Mastitis without abscess: Secondary | ICD-10-CM

## 2015-08-07 DIAGNOSIS — Z95828 Presence of other vascular implants and grafts: Secondary | ICD-10-CM

## 2015-08-07 LAB — CMP (CANCER CENTER ONLY)
ALT(SGPT): 15 U/L (ref 10–47)
AST: 17 U/L (ref 11–38)
Albumin: 2.8 g/dL — ABNORMAL LOW (ref 3.3–5.5)
Alkaline Phosphatase: 130 U/L — ABNORMAL HIGH (ref 26–84)
BUN: 9 mg/dL (ref 7–22)
CHLORIDE: 97 meq/L — AB (ref 98–108)
CO2: 23 mEq/L (ref 18–33)
Calcium: 9.3 mg/dL (ref 8.0–10.3)
Creat: 0.6 mg/dl (ref 0.6–1.2)
Glucose, Bld: 106 mg/dL (ref 73–118)
POTASSIUM: 3.5 meq/L (ref 3.3–4.7)
Sodium: 134 mEq/L (ref 128–145)
TOTAL PROTEIN: 7.5 g/dL (ref 6.4–8.1)
Total Bilirubin: 0.6 mg/dl (ref 0.20–1.60)

## 2015-08-07 LAB — LACTATE DEHYDROGENASE: LDH: 242 U/L (ref 125–245)

## 2015-08-07 LAB — CBC WITH DIFFERENTIAL (CANCER CENTER ONLY)
BASO#: 0.1 10*3/uL (ref 0.0–0.2)
BASO%: 0.3 % (ref 0.0–2.0)
EOS%: 2.2 % (ref 0.0–7.0)
Eosinophils Absolute: 0.6 10*3/uL — ABNORMAL HIGH (ref 0.0–0.5)
HEMATOCRIT: 35.5 % (ref 34.8–46.6)
HGB: 11.4 g/dL — ABNORMAL LOW (ref 11.6–15.9)
LYMPH#: 3.4 10*3/uL — AB (ref 0.9–3.3)
LYMPH%: 12.3 % — ABNORMAL LOW (ref 14.0–48.0)
MCH: 27 pg (ref 26.0–34.0)
MCHC: 32.1 g/dL (ref 32.0–36.0)
MCV: 84 fL (ref 81–101)
MONO#: 1.8 10*3/uL — ABNORMAL HIGH (ref 0.1–0.9)
MONO%: 6.4 % (ref 0.0–13.0)
NEUT%: 78.8 % (ref 39.6–80.0)
NEUTROS ABS: 21.5 10*3/uL — AB (ref 1.5–6.5)
Platelets: 612 10*3/uL — ABNORMAL HIGH (ref 145–400)
RBC: 4.22 10*6/uL (ref 3.70–5.32)
RDW: 12.9 % (ref 11.1–15.7)
WBC: 27.3 10*3/uL — AB (ref 3.9–10.0)

## 2015-08-07 LAB — TSH: TSH: 0.557 m(IU)/L (ref 0.308–3.960)

## 2015-08-07 MED ORDER — SODIUM CHLORIDE 0.9 % IJ SOLN
10.0000 mL | INTRAMUSCULAR | Status: DC | PRN
  Filled 2015-08-07: qty 10

## 2015-08-07 MED ORDER — SODIUM CHLORIDE 0.9 % IJ SOLN
10.0000 mL | INTRAMUSCULAR | Status: DC | PRN
  Administered 2015-08-07: 10 mL via INTRAVENOUS
  Filled 2015-08-07: qty 10

## 2015-08-07 MED ORDER — PALONOSETRON HCL INJECTION 0.25 MG/5ML
INTRAVENOUS | Status: AC
  Filled 2015-08-07: qty 5

## 2015-08-07 MED ORDER — PALONOSETRON HCL INJECTION 0.25 MG/5ML
0.2500 mg | Freq: Once | INTRAVENOUS | Status: AC
Start: 2015-08-07 — End: 2015-08-07
  Administered 2015-08-07: 0.25 mg via INTRAVENOUS

## 2015-08-07 MED ORDER — HEPARIN SOD (PORK) LOCK FLUSH 100 UNIT/ML IV SOLN
500.0000 [IU] | Freq: Once | INTRAVENOUS | Status: AC
  Administered 2015-08-07: 500 [IU] via INTRAVENOUS
  Filled 2015-08-07: qty 5

## 2015-08-07 MED ORDER — LACTULOSE 10 GM/15ML PO SOLN: 10.0000 g | Freq: Three times a day (TID) | ORAL | Status: DC

## 2015-08-07 MED ORDER — SODIUM CHLORIDE 0.9 % IV SOLN
Freq: Once | INTRAVENOUS | Status: AC
  Administered 2015-08-07: 12:00:00 via INTRAVENOUS

## 2015-08-07 MED ORDER — SODIUM CHLORIDE 0.9 % IV SOLN
Freq: Once | INTRAVENOUS | Status: AC
  Administered 2015-08-07: 12:00:00 via INTRAVENOUS
  Filled 2015-08-07: qty 5

## 2015-08-07 MED ORDER — HEPARIN SOD (PORK) LOCK FLUSH 100 UNIT/ML IV SOLN
500.0000 [IU] | Freq: Once | INTRAVENOUS | Status: DC
  Filled 2015-08-07: qty 5

## 2015-08-07 NOTE — Progress Notes (Signed)
Hematology and Oncology Follow Up Visit  Felicia Richmond [id number] [birth year] 54 y.o. 08/07/2015   Principle Diagnosis:  Metastatic melanoma- BRAF (+) - progressive  Current Therapy:  Pembrolizumab (57m/Kg) IV q 3 week s/p cycle 2     Interim History:  Felicia Richmond here today with her husband for follow-up and treatment. She is feeling fatigued and has had a few episodes off nausea and dry heaving. She  Having flatus. Her abdomen is soft but tender on the sides. She states that she has not had a BM in 7 days. Her appetite has been down and she has not been eating well. She is drinking some fluids but admits that she does feel dehydrated.  No fever, chills, vomiting, cough, rash, dizziness, SOB, chest pain, palpitations, or changes in bladder habits.  She went to the ED last week with abdominal pain. Her CT showed progression of metastatic disease in the chest, abdomen and pelvis.  No swelling, numbness or tingling in her extremities.  She has some left axillary adenopathy and palpable mass. She still has some erythema under the right breast. The area is tender to the touch. She has completed her Keflex. No episodes of bleeding or bruising.   Medications:    Medication List       This list is accurate as of: 08/07/15 10:40 AM.  Always use your most recent med list.               atorvastatin 40 MG tablet  Commonly known as:  LIPITOR  Take 1 tablet (40 mg total) by mouth daily.     buprenorphine 20 MCG/HR Ptwk patch  Commonly known as:  BUTRANS  Place 1 patch (20 mcg total) onto the skin once a week.     cephALEXin 500 MG capsule  Commonly known as:  KEFLEX  Take 1 capsule (500 mg total) by mouth 4 (four) times daily.     dronabinol 5 MG capsule  Commonly known as:  MARINOL  Take 1 capsule (5 mg total) by mouth 2 (two) times daily before a meal.     ibuprofen 200 MG tablet  Commonly known as:  ADVIL,MOTRIN  Take 400 mg by mouth every 6 (six) hours as needed for mild pain.       lidocaine-prilocaine cream  Commonly known as:  EMLA  Apply to affected area once     LORazepam 0.5 MG tablet  Commonly known as:  ATIVAN  Take 1 tablet (0.5 mg total) by mouth every 6 (six) hours as needed (Nausea or vomiting).     Meclizine HCl 25 MG Chew  Chew 25 mg by mouth daily as needed (dizziness).     methimazole 5 MG tablet  Commonly known as:  TAPAZOLE  Take 5 mg by mouth daily.     morphine 15 MG tablet  Commonly known as:  MSIR  Take 1 tablet (15 mg total) by mouth every 6 (six) hours as needed for severe pain.     ondansetron 8 MG tablet  Commonly known as:  ZOFRAN  Take 1 tablet (8 mg total) by mouth 2 (two) times daily as needed (Nausea or vomiting).     potassium chloride 10 MEQ tablet  Commonly known as:  K-DUR  Take 1 tablet (10 mEq total) by mouth daily.     prochlorperazine 10 MG tablet  Commonly known as:  COMPAZINE  Take 1 tablet (10 mg total) by mouth every 6 (six) hours as needed (Nausea or vomiting).  Allergies:  Allergies  Allergen Reactions  . Cotellic [Cobimetinib] Diarrhea  . Oxycodone Nausea And Vomiting  . Pseudoephedrine Other (See Comments)    Makes patient feel weird  . Scopolamine Other (See Comments)  . Tramadol     Other reaction(s): GI Upset (intolerance)    Past Medical History, Surgical history, Social history, and Family History were reviewed and updated.  Review of Systems: All other 10 point review of systems is negative.   Physical Exam:  height is 5' 1"  (1.549 m) and weight is 141 lb (63.957 kg). Her oral temperature is 97.3 F (36.3 C). Her blood pressure is 139/54 and her pulse is 113. Her respiration is 14.   Wt Readings from Last 3 Encounters:  08/07/15 141 lb (63.957 kg)  07/27/15 150 lb (68.04 kg)  07/18/15 150 lb (68.04 kg)    Ocular: Sclerae unicteric, pupils equal, round and reactive to light Ear-nose-throat: Oropharynx clear, dentition fair Lymphatic: No cervical or supraclavicular  adenopathy Lungs no rales or rhonchi, good excursion bilaterally Heart regular rate and rhythm, no murmur appreciated Abd soft, nontender, positive bowel sounds MSK no focal spinal tenderness, no joint edema Neuro: non-focal, well-oriented, appropriate affect Breasts: No mass or lesion. Area of erythema under right breast. No changes with left breast.   Lab Results  Component Value Date   WBC 27.3* 08/07/2015   HGB 11.4* 08/07/2015   HCT 35.5 08/07/2015   MCV 84 08/07/2015   PLT 612* 08/07/2015   No results found for: FERRITIN, IRON, TIBC, UIBC, IRONPCTSAT Lab Results  Component Value Date   RBC 4.22 08/07/2015   No results found for: KPAFRELGTCHN, LAMBDASER, KAPLAMBRATIO No results found for: IGGSERUM, IGA, IGMSERUM No results found for: Odetta Pink, SPEI   Chemistry      Component Value Date/Time   NA 131* 07/27/2015 1350   NA 134 07/18/2015 1048   K 3.7 07/27/2015 1350   K 3.5 07/18/2015 1048   CL 98* 07/27/2015 1350   CL 97* 07/18/2015 1048   CO2 24 07/27/2015 1350   CO2 25 07/18/2015 1048   BUN 12 07/27/2015 1350   BUN 11 07/18/2015 1048   CREATININE 0.60 07/27/2015 1350   CREATININE 0.6 07/18/2015 1048      Component Value Date/Time   CALCIUM 8.7* 07/27/2015 1350   CALCIUM 9.5 07/18/2015 1048   ALKPHOS 115 07/27/2015 1350   ALKPHOS 99* 07/18/2015 1048   AST 15 07/27/2015 1350   AST 16 07/18/2015 1048   ALT 13* 07/27/2015 1350   ALT <5* 07/18/2015 1048   BILITOT 0.4 07/27/2015 1350   BILITOT 0.70 07/18/2015 1048     Impression and Plan: Felicia Richmond is a 54 yo white female with metastatic melanoma. She has completed 2 cycles of immunotherapy. CT scan last week showed progression of metastatic disease in the chest, abdomen and pelvis.  She is symptomatic with nausea, abdominal discomfort and constipation.  I sent a prescription for lactulose to her pharmacy in [place]. If she has not had a BM by Thursday  morning she will contact us and we will set up an abdominal US to assess for blockage.  She is nauseated here today so we gave her an antiemetic as well as IV fluids.  I spoke with Felicia Richmond and it was decided to hold of on treatment today and resume next week. He also plans to add Yervoy to her treatment.   She will contact us with any questions or  concerns. We can certainly see her back sooner if need be.   Eliezer Bottom, NP 12/13/201610:40 AM

## 2015-08-07 NOTE — Patient Instructions (Signed)
Palonosetron Injection What is this medicine? PALONOSETRON (pal oh NOE se tron) is used to prevent nausea and vomiting caused by chemotherapy. It also helps prevent delayed nausea and vomiting that may occur a few days after your treatment. This medicine may be used for other purposes; ask your health care provider or pharmacist if you have questions. What should I tell my health care provider before I take this medicine? They need to know if you have any of these conditions: -an unusual or allergic reaction to palonosetron, dolasetron, granisetron, ondansetron, other medicines, foods, dyes, or preservatives -pregnant or trying to get pregnant -breast-feeding How should I use this medicine? This medicine is for infusion into a vein. It is given by a health care professional in a hospital or clinic setting. Talk to your pediatrician regarding the use of this medicine in children. While this drug may be prescribed for children as young as 1 month for selected conditions, precautions do apply. Overdosage: If you think you have taken too much of this medicine contact a poison control center or emergency room at once. NOTE: This medicine is only for you. Do not share this medicine with others. What if I miss a dose? This does not apply. What may interact with this medicine? -certain medicines for depression, anxiety, or psychotic disturbances -fentanyl -linezolid -MAOIs like Carbex, Eldepryl, Marplan, Nardil, and Parnate -methylene blue (injected into a vein) -tramadol This list may not describe all possible interactions. Give your health care provider a list of all the medicines, herbs, non-prescription drugs, or dietary supplements you use. Also tell them if you smoke, drink alcohol, or use illegal drugs. Some items may interact with your medicine. What should I watch for while using this medicine? Your condition will be monitored carefully while you are receiving this medicine. What side  effects may I notice from receiving this medicine? Side effects that you should report to your doctor or health care professional as soon as possible: -allergic reactions like skin rash, itching or hives, swelling of the face, lips, or tongue -breathing problems -confusion -dizziness -fast, irregular heartbeat -fever and chills -loss of balance or coordination -seizures -sweating -swelling of the hands and feet -tremors -unusually weak or tired Side effects that usually do not require medical attention (report to your doctor or health care professional if they continue or are bothersome): -constipation or diarrhea -headache This list may not describe all possible side effects. Call your doctor for medical advice about side effects. You may report side effects to FDA at 1-800-FDA-1088. Where should I keep my medicine? This drug is given in a hospital or clinic and will not be stored at home. NOTE: This sheet is a summary. It may not cover all possible information. If you have questions about this medicine, talk to your doctor, pharmacist, or health care provider.    2016, Elsevier/Gold Standard. (2013-06-17 10:38:36) Fosaprepitant injection What is this medicine? FOSAPREPITANT (fos ap RE pi tant) is used together with other medicines to prevent nausea and vomiting caused by cancer treatment (chemotherapy). This medicine may be used for other purposes; ask your health care provider or pharmacist if you have questions. What should I tell my health care provider before I take this medicine? They need to know if you have any of these conditions: -liver disease -an unusual or allergic reaction to fosaprepitant, aprepitant, medicines, foods, dyes, or preservatives -pregnant or trying to get pregnant -breast-feeding How should I use this medicine? This medicine is for injection into a vein. It  is given by a health care professional in a hospital or clinic setting. Talk to your  pediatrician regarding the use of this medicine in children. Special care may be needed. Overdosage: If you think you have taken too much of this medicine contact a poison control center or emergency room at once. NOTE: This medicine is only for you. Do not share this medicine with others. What if I miss a dose? This does not apply. What may interact with this medicine? Do not take this medicine with any of these medicines: -cisapride -flibanserin -lomitapide -pimozide This medicine may also interact with the following medications: -diltiazem -female hormones, like estrogens or progestins and birth control pills -medicines for fungal infections like ketoconazole and itraconazole -medicines for HIV -medicines for seizures or to control epilepsy like carbamazepine or phenytoin -medicines used for sleep or anxiety disorders like alprazolam, diazepam, or midazolam -nefazodone -paroxetine -ranolazine -rifampin -some chemotherapy medications like etoposide, ifosfamide, vinblastine, vincristine -some antibiotics like clarithromycin, erythromycin, troleandomycin -steroid medicines like dexamethasone or methylprednisolone -tolbutamide -warfarin This list may not describe all possible interactions. Give your health care provider a list of all the medicines, herbs, non-prescription drugs, or dietary supplements you use. Also tell them if you smoke, drink alcohol, or use illegal drugs. Some items may interact with your medicine. What should I watch for while using this medicine? Do not take this medicine if you already have nausea and vomiting. Ask your health care provider what to do if you already have nausea. Birth control pills and other methods of hormonal contraception (for example, IUD or patch) may not work properly while you are taking this medicine. Use an extra method of birth control during treatment and for 1 month after your last dose of fosaprepitant. This medicine should not be  used continuously for a long time. Visit your doctor or health care professional for regular check-ups. This medicine may change your liver function blood test results. What side effects may I notice from receiving this medicine? Side effects that you should report to your doctor or health care professional as soon as possible: -allergic reactions like skin rash, itching or hives, swelling of the face, lips, or tongue -breathing problems -changes in heart rhythm -high or low blood pressure -pain, redness, or irritation at site where injected -rectal bleeding -serious dizziness or disorientation, confusion -sharp or severe stomach pain -sharp pain in your leg Side effects that usually do not require medical attention (report to your doctor or health care professional if they continue or are bothersome): -constipation or diarrhea -hair loss -headache -hiccups -loss of appetite -nausea -upset stomach -tiredness This list may not describe all possible side effects. Call your doctor for medical advice about side effects. You may report side effects to FDA at 1-800-FDA-1088. Where should I keep my medicine? This drug is given in a hospital or clinic and will not be stored at home. NOTE: This sheet is a summary. It may not cover all possible information. If you have questions about this medicine, talk to your doctor, pharmacist, or health care provider.    2016, Elsevier/Gold Standard. (2014-09-27 10:45:34) Dehydration, Adult Dehydration is a condition in which you do not have enough fluid or water in your body. It happens when you take in less fluid than you lose. Vital organs such as the kidneys, brain, and heart cannot function without a proper amount of fluids. Any loss of fluids from the body can cause dehydration.  Dehydration can range from mild to severe. This condition  should be treated right away to help prevent it from becoming severe. CAUSES  This condition may be caused  by:  Vomiting.  Diarrhea.  Excessive sweating, such as when exercising in hot or humid weather.  Not drinking enough fluid during strenuous exercise or during an illness.  Excessive urine output.  Fever.  Certain medicines. RISK FACTORS This condition is more likely to develop in:  People who are taking certain medicines that cause the body to lose excess fluid (diuretics).   People who have a chronic illness, such as diabetes, that may increase urination.  Older adults.   People who live at high altitudes.   People who participate in endurance sports.  SYMPTOMS  Mild Dehydration  Thirst.  Dry lips.  Slightly dry mouth.  Dry, warm skin. Moderate Dehydration  Very dry mouth.   Muscle cramps.   Dark urine and decreased urine production.   Decreased tear production.   Headache.   Light-headedness, especially when you stand up from a sitting position.  Severe Dehydration  Changes in skin.   Cold and clammy skin.   Skin does not spring back quickly when lightly pinched and released.   Changes in body fluids.   Extreme thirst.   No tears.   Not able to sweat when body temperature is high, such as in hot weather.   Minimal urine production.   Changes in vital signs.   Rapid, weak pulse (more than 100 beats per minute when you are sitting still).   Rapid breathing.   Low blood pressure.   Other changes.   Sunken eyes.   Cold hands and feet.   Confusion.  Lethargy and difficulty being awakened.  Fainting (syncope).   Short-term weight loss.   Unconsciousness. DIAGNOSIS  This condition may be diagnosed based on your symptoms. You may also have tests to determine how severe your dehydration is. These tests may include:   Urine tests.   Blood tests.  TREATMENT  Treatment for this condition depends on the severity. Mild or moderate dehydration can often be treated at home. Treatment should be started  right away. Do not wait until dehydration becomes severe. Severe dehydration needs to be treated at the hospital. Treatment for Mild Dehydration  Drinking plenty of water to replace the fluid you have lost.   Replacing minerals in your blood (electrolytes) that you may have lost.  Treatment for Moderate Dehydration  Consuming oral rehydration solution (ORS). Treatment for Severe Dehydration  Receiving fluid through an IV tube.   Receiving electrolyte solution through a feeding tube that is passed through your nose and into your stomach (nasogastric tube or NG tube).  Correcting any abnormalities in electrolytes. HOME CARE INSTRUCTIONS   Drink enough fluid to keep your urine clear or pale yellow.   Drink water or fluid slowly by taking small sips. You can also try sucking on ice cubes.  Have food or beverages that contain electrolytes. Examples include bananas and sports drinks.  Take over-the-counter and prescription medicines only as told by your health care provider.   Prepare ORS according to the manufacturer's instructions. Take sips of ORS every 5 minutes until your urine returns to normal.  If you have vomiting or diarrhea, continue to try to drink water, ORS, or both.   If you have diarrhea, avoid:   Beverages that contain caffeine.   Fruit juice.   Milk.   Carbonated soft drinks.  Do not take salt tablets. This can lead to the condition of having  too much sodium in your body (hypernatremia).  SEEK MEDICAL CARE IF:  You cannot eat or drink without vomiting.  You have had moderate diarrhea during a period of more than 24 hours.  You have a fever. SEEK IMMEDIATE MEDICAL CARE IF:   You have extreme thirst.  You have severe diarrhea.  You have not urinated in 6-8 hours, or you have urinated only a small amount of very dark urine.  You have shriveled skin.  You are dizzy, confused, or both.   This information is not intended to replace  advice given to you by your health care provider. Make sure you discuss any questions you have with your health care provider.   Document Released: 08/11/2005 Document Revised: 05/02/2015 Document Reviewed: 12/27/2014 Elsevier Interactive Patient Education [employer].

## 2015-08-08 ENCOUNTER — Ambulatory Visit: Payer: BLUE CROSS/BLUE SHIELD

## 2015-08-08 LAB — PREALBUMIN: PREALBUMIN: 5 mg/dL — AB (ref 17–34)

## 2015-08-09 ENCOUNTER — Ambulatory Visit: Payer: BLUE CROSS/BLUE SHIELD

## 2015-08-09 ENCOUNTER — Ambulatory Visit: Payer: BLUE CROSS/BLUE SHIELD | Admitting: Hematology & Oncology

## 2015-08-09 ENCOUNTER — Other Ambulatory Visit: Payer: BLUE CROSS/BLUE SHIELD

## 2015-08-14 ENCOUNTER — Other Ambulatory Visit (HOSPITAL_BASED_OUTPATIENT_CLINIC_OR_DEPARTMENT_OTHER): Payer: BLUE CROSS/BLUE SHIELD

## 2015-08-14 ENCOUNTER — Ambulatory Visit: Payer: BLUE CROSS/BLUE SHIELD

## 2015-08-14 ENCOUNTER — Ambulatory Visit (HOSPITAL_BASED_OUTPATIENT_CLINIC_OR_DEPARTMENT_OTHER): Payer: BLUE CROSS/BLUE SHIELD | Admitting: Family

## 2015-08-14 ENCOUNTER — Encounter: Payer: Self-pay | Admitting: Family

## 2015-08-14 VITALS — BP 128/58 | HR 110 | Temp 97.7°F | Resp 16 | Ht 61.0 in | Wt 139.0 lb

## 2015-08-14 DIAGNOSIS — C439 Malignant melanoma of skin, unspecified: Secondary | ICD-10-CM

## 2015-08-14 DIAGNOSIS — R5383 Other fatigue: Secondary | ICD-10-CM

## 2015-08-14 DIAGNOSIS — K5900 Constipation, unspecified: Secondary | ICD-10-CM

## 2015-08-14 DIAGNOSIS — R112 Nausea with vomiting, unspecified: Secondary | ICD-10-CM

## 2015-08-14 DIAGNOSIS — R110 Nausea: Secondary | ICD-10-CM

## 2015-08-14 DIAGNOSIS — C799 Secondary malignant neoplasm of unspecified site: Secondary | ICD-10-CM

## 2015-08-14 LAB — CMP (CANCER CENTER ONLY)
ALK PHOS: 85 U/L — AB (ref 26–84)
ALT: 13 U/L (ref 10–47)
AST: 19 U/L (ref 11–38)
Albumin: 2.6 g/dL — ABNORMAL LOW (ref 3.3–5.5)
BUN: 7 mg/dL (ref 7–22)
CHLORIDE: 99 meq/L (ref 98–108)
CO2: 25 mEq/L (ref 18–33)
CREATININE: 0.5 mg/dL — AB (ref 0.6–1.2)
Calcium: 8.4 mg/dL (ref 8.0–10.3)
GLUCOSE: 128 mg/dL — AB (ref 73–118)
Potassium: 4.2 mEq/L (ref 3.3–4.7)
SODIUM: 133 meq/L (ref 128–145)
TOTAL PROTEIN: 6.1 g/dL — AB (ref 6.4–8.1)
Total Bilirubin: 0.5 mg/dl (ref 0.20–1.60)

## 2015-08-14 LAB — CBC WITH DIFFERENTIAL (CANCER CENTER ONLY)
BASO#: 0.1 10*3/uL (ref 0.0–0.2)
BASO%: 0.3 % (ref 0.0–2.0)
EOS ABS: 0.8 10*3/uL — AB (ref 0.0–0.5)
EOS%: 3.1 % (ref 0.0–7.0)
HCT: 37.7 % (ref 34.8–46.6)
HGB: 11.9 g/dL (ref 11.6–15.9)
LYMPH#: 3.5 10*3/uL — ABNORMAL HIGH (ref 0.9–3.3)
LYMPH%: 14.2 % (ref 14.0–48.0)
MCH: 26.6 pg (ref 26.0–34.0)
MCHC: 31.6 g/dL — AB (ref 32.0–36.0)
MCV: 84 fL (ref 81–101)
MONO#: 1.6 10*3/uL — AB (ref 0.1–0.9)
MONO%: 6.4 % (ref 0.0–13.0)
NEUT#: 18.5 10*3/uL — ABNORMAL HIGH (ref 1.5–6.5)
NEUT%: 76 % (ref 39.6–80.0)
PLATELETS: 593 10*3/uL — AB (ref 145–400)
RBC: 4.47 10*6/uL (ref 3.70–5.32)
RDW: 13.5 % (ref 11.1–15.7)
WBC: 24.4 10*3/uL — ABNORMAL HIGH (ref 3.9–10.0)

## 2015-08-14 LAB — TSH: TSH: 0.493 m(IU)/L (ref 0.308–3.960)

## 2015-08-14 LAB — LACTATE DEHYDROGENASE: LDH: 254 U/L — ABNORMAL HIGH (ref 125–245)

## 2015-08-14 MED ORDER — MORPHINE SULFATE ER 30 MG PO TBCR: 30.0000 mg | EXTENDED_RELEASE_TABLET | Freq: Two times a day (BID) | ORAL | Status: DC

## 2015-08-14 MED ORDER — NALOXEGOL OXALATE 25 MG PO TABS: 25.0000 mg | ORAL_TABLET | Freq: Every day | ORAL | Status: DC

## 2015-08-14 MED ORDER — MECLIZINE HCL 25 MG PO CHEW: 25.0000 mg | CHEWABLE_TABLET | Freq: Every day | ORAL | Status: AC | PRN

## 2015-08-14 NOTE — Progress Notes (Signed)
Hematology and Oncology Follow Up Visit  Felicia Richmond [id number] [birth year] 54 y.o. 08/14/2015   Principle Diagnosis:  Metastatic melanoma- BRAF (+) - progressive  Current Therapy:  Pembrolizumab (1m/Kg) IV q 3 week s/p cycle 2     Interim History:  Ms. KJudyis here today with her husband for follow-up. She is still feeling very fatigued. She was able to have several small BMs with the lactulose but still feels bloated and constipated. She states that she has pressure and pain occasionally in her right side.  Her appetite is improving and she has been able to eat some. She is drinking fluids and denies feeling dehydrated today. She has had some dry heaves and nausea but no vomiting.  No fever, chills, vomiting, cough, rash, dizziness, SOB, chest pain, palpitations, or changes in bladder habits.  No swelling, numbness or tingling in her extremities.  Her axillary adenopathy is improved. The area of cellulitis under her right breast where she had her biopsy is healing. She states that she is still on an antibiotic for this.  No episodes of bleeding or bruising.   Medications:    Medication List       This list is accurate as of: 08/14/15  1:32 PM.  Always use your most recent med list.               atorvastatin 40 MG tablet  Commonly known as:  LIPITOR  Take 1 tablet (40 mg total) by mouth daily.     buprenorphine 20 MCG/HR Ptwk patch  Commonly known as:  BUTRANS  Place 1 patch (20 mcg total) onto the skin once a week.     cephALEXin 500 MG capsule  Commonly known as:  KEFLEX  Take 1 capsule (500 mg total) by mouth 4 (four) times daily.     dronabinol 5 MG capsule  Commonly known as:  MARINOL  Take 1 capsule (5 mg total) by mouth 2 (two) times daily before a meal.     ibuprofen 200 MG tablet  Commonly known as:  ADVIL,MOTRIN  Take 400 mg by mouth every 6 (six) hours as needed for mild pain.     lactulose 10 GM/15ML solution  Commonly known as:  CHRONULAC  Take 15  mLs (10 g total) by mouth 3 (three) times daily.     Meclizine HCl 25 MG Chew  Chew 25 mg by mouth daily as needed (dizziness).     methimazole 5 MG tablet  Commonly known as:  TAPAZOLE  Take 5 mg by mouth daily.     morphine 15 MG tablet  Commonly known as:  MSIR  Take 1 tablet (15 mg total) by mouth every 6 (six) hours as needed for severe pain.     morphine 30 MG 12 hr tablet  Commonly known as:  MS CONTIN  Take 1 tablet (30 mg total) by mouth every 12 (twelve) hours.     naloxegol oxalate 25 MG Tabs tablet  Commonly known as:  MOVANTIK  Take 1 tablet (25 mg total) by mouth daily.     potassium chloride 10 MEQ tablet  Commonly known as:  K-DUR  Take 1 tablet (10 mEq total) by mouth daily.        Allergies:  Allergies  Allergen Reactions  . Cotellic [Cobimetinib] Diarrhea  . Oxycodone Nausea And Vomiting  . Pseudoephedrine Other (See Comments)    Makes patient feel weird  . Scopolamine Other (See Comments)  . Tramadol  Other reaction(s): GI Upset (intolerance)    Past Medical History, Surgical history, Social history, and Family History were reviewed and updated.  Review of Systems: All other 10 point review of systems is negative.   Physical Exam:  height is _0  (1.549 m) and weight is 139 lb (63.05 kg). Her oral temperature is 97.7 F (36.5 C). Her blood pressure is 128/58 and her pulse is 110. Her respiration is 16.   Wt Readings from Last 3 Encounters:  08/14/15 139 lb (63.05 kg)  08/07/15 141 lb (63.957 kg)  07/27/15 150 lb (68.04 kg)    Ocular: Sclerae unicteric, pupils equal, round and reactive to light Ear-nose-throat: Oropharynx clear, dentition fair Lymphatic: No cervical or supraclavicular adenopathy Lungs no rales or rhonchi, good excursion bilaterally Heart regular rate and rhythm, no murmur appreciated Abd soft, nontender, positive bowel sounds MSK no focal spinal tenderness, no joint edema Neuro: non-focal, well-oriented, appropriate  affect Breasts: Area of erythema under right breast improving. No changes with left breast.   Lab Results  Component Value Date   WBC 24.4* 08/14/2015   HGB 11.9 08/14/2015   HCT 37.7 08/14/2015   MCV 84 08/14/2015   PLT 593* 08/14/2015   No results found for: FERRITIN, IRON, TIBC, UIBC, IRONPCTSAT Lab Results  Component Value Date   RBC 4.47 08/14/2015   No results found for: KPAFRELGTCHN, LAMBDASER, KAPLAMBRATIO No results found for: Kandis Cocking, IGMSERUM No results found for: Odetta Pink, SPEI   Chemistry      Component Value Date/Time   NA 133 08/14/2015 1041   NA 131* 07/27/2015 1350   K 4.2 08/14/2015 1041   K 3.7 07/27/2015 1350   CL 99 08/14/2015 1041   CL 98* 07/27/2015 1350   CO2 25 08/14/2015 1041   CO2 24 07/27/2015 1350   BUN 7 08/14/2015 1041   BUN 12 07/27/2015 1350   CREATININE 0.5* 08/14/2015 1041   CREATININE 0.60 07/27/2015 1350      Component Value Date/Time   CALCIUM 8.4 08/14/2015 1041   CALCIUM 8.7* 07/27/2015 1350   ALKPHOS 85* 08/14/2015 1041   ALKPHOS 115 07/27/2015 1350   AST 19 08/14/2015 1041   AST 15 07/27/2015 1350   ALT 13 08/14/2015 1041   ALT 13* 07/27/2015 1350   BILITOT 0.50 08/14/2015 1041   BILITOT 0.4 07/27/2015 1350     Impression and Plan: Felicia Richmond is a 54 yo white female with metastatic melanoma. She has completed 2 cycles of immunotherapy. CT scan last week showed progression of metastatic disease in the chest, abdomen and pelvis. We held treatment last week due to her symptoms. She is still feeling fatigued and having nausea and constipation.  We will try her on Movantik and see if this gives her some relief.  We also gave her a prescription for MS contin for pain.  We are waiting for insurance approval to start her on Yervoy and Opdivo and will start her as soon as this comes through.  She will contact us with any questions or concerns. We can certainly see her  back sooner if need be.   Eliezer Bottom, NP 12/20/20161:32 PM

## 2015-08-14 NOTE — Progress Notes (Signed)
No treatment today per Dr Ennever 

## 2015-08-15 ENCOUNTER — Other Ambulatory Visit: Payer: Self-pay | Admitting: Hematology & Oncology

## 2015-08-15 ENCOUNTER — Telehealth: Payer: Self-pay | Admitting: Hematology & Oncology

## 2015-08-15 LAB — PREALBUMIN: Prealbumin: 6 mg/dL — ABNORMAL LOW (ref 17–34)

## 2015-08-15 NOTE — Telephone Encounter (Addendum)
[place]:  [id number] Auth: 08/17/2015 - 12/15/2015  Please review for pre-determination or prior auth. Felicia Richmond Dob: 03-17-1961 Id: [id number] Dos: 08/17/2015   Chemo OP Facility: [facility] [id number] OPDIVO  [id number] IMLYGIC (talimogenelaherparepvec) [id number]      Dx: C82.9; R64                Donnal Moat Nurse @ [phone] [place]:  @ [id number]  P: [phone] F: [phone]

## 2015-08-17 ENCOUNTER — Ambulatory Visit: Payer: BLUE CROSS/BLUE SHIELD

## 2015-08-21 ENCOUNTER — Ambulatory Visit (HOSPITAL_BASED_OUTPATIENT_CLINIC_OR_DEPARTMENT_OTHER): Payer: BLUE CROSS/BLUE SHIELD

## 2015-08-21 VITALS — BP 111/50 | HR 100 | Temp 98.2°F | Resp 18

## 2015-08-21 DIAGNOSIS — C439 Malignant melanoma of skin, unspecified: Secondary | ICD-10-CM | POA: Diagnosis not present

## 2015-08-21 DIAGNOSIS — Z5112 Encounter for antineoplastic immunotherapy: Secondary | ICD-10-CM

## 2015-08-21 DIAGNOSIS — C799 Secondary malignant neoplasm of unspecified site: Secondary | ICD-10-CM

## 2015-08-21 MED ORDER — NIVOLUMAB CHEMO INJECTION 100 MG/10ML: 1.0000 mg/kg | Freq: Once | INTRAVENOUS | Status: DC

## 2015-08-21 MED ORDER — SODIUM CHLORIDE 0.9 % IV SOLN
3.0000 mg/kg | Freq: Once | INTRAVENOUS | Status: AC
  Administered 2015-08-21: 190 mg via INTRAVENOUS
  Filled 2015-08-21: qty 38

## 2015-08-21 MED ORDER — HEPARIN SOD (PORK) LOCK FLUSH 100 UNIT/ML IV SOLN
500.0000 [IU] | Freq: Once | INTRAVENOUS | Status: AC | PRN
  Administered 2015-08-21: 500 [IU]
  Filled 2015-08-21: qty 5

## 2015-08-21 MED ORDER — SODIUM CHLORIDE 0.9 % IV SOLN
1.0000 mg/kg | Freq: Once | INTRAVENOUS | Status: AC
  Administered 2015-08-21: 60 mg via INTRAVENOUS
  Filled 2015-08-21: qty 6

## 2015-08-21 MED ORDER — SODIUM CHLORIDE 0.9 % IV SOLN
Freq: Once | INTRAVENOUS | Status: AC
  Administered 2015-08-21: 13:00:00 via INTRAVENOUS

## 2015-08-21 MED ORDER — LIDOCAINE-PRILOCAINE 2.5-2.5 % EX CREA: TOPICAL_CREAM | CUTANEOUS | Status: DC

## 2015-08-21 MED ORDER — SODIUM CHLORIDE 0.9 % IJ SOLN
10.0000 mL | INTRAMUSCULAR | Status: DC | PRN
  Administered 2015-08-21: 10 mL
  Filled 2015-08-21: qty 10

## 2015-08-21 NOTE — Patient Instructions (Signed)
Nivolumab injection What is this medicine? NIVOLUMAB (nye VOL ue mab) is a monoclonal antibody. It is used to treat melanoma, lung cancer, kidney cancer, and Hodgkin lymphoma. This medicine may be used for other purposes; ask your health care provider or pharmacist if you have questions. What should I tell my health care provider before I take this medicine? They need to know if you have any of these conditions: -diabetes -immune system problems -kidney disease -liver disease -lung disease -organ transplant -stomach or intestine problems -thyroid disease -an unusual or allergic reaction to nivolumab, other medicines, foods, dyes, or preservatives -pregnant or trying to get pregnant -breast-feeding How should I use this medicine? This medicine is for infusion into a vein. It is given by a health care professional in a hospital or clinic setting. A special MedGuide will be given to you before each treatment. Be sure to read this information carefully each time. Talk to your pediatrician regarding the use of this medicine in children. Special care may be needed. Overdosage: If you think you have taken too much of this medicine contact a poison control center or emergency room at once. NOTE: This medicine is only for you. Do not share this medicine with others. What if I miss a dose? It is important not to miss your dose. Call your doctor or health care professional if you are unable to keep an appointment. What may interact with this medicine? Interactions have not been studied. Give your health care provider a list of all the medicines, herbs, non-prescription drugs, or dietary supplements you use. Also tell them if you smoke, drink alcohol, or use illegal drugs. Some items may interact with your medicine. This list may not describe all possible interactions. Give your health care provider a list of all the medicines, herbs, non-prescription drugs, or dietary supplements you use. Also tell  them if you smoke, drink alcohol, or use illegal drugs. Some items may interact with your medicine. What should I watch for while using this medicine? This drug may make you feel generally unwell. Continue your course of treatment even though you feel ill unless your doctor tells you to stop. You may need blood work done while you are taking this medicine. Do not become pregnant while taking this medicine or for 5 months after stopping it. Women should inform their doctor if they wish to become pregnant or think they might be pregnant. There is a potential for serious side effects to an unborn child. Talk to your health care professional or pharmacist for more information. Do not breast-feed an infant while taking this medicine. What side effects may I notice from receiving this medicine? Side effects that you should report to your doctor or health care professional as soon as possible: -allergic reactions like skin rash, itching or hives, swelling of the face, lips, or tongue -black, tarry stools -blood in the urine -bloody or watery diarrhea -changes in vision -change in sex drive -changes in emotions or moods -chest pain -confusion -cough -decreased appetite -diarrhea -facial flushing -feeling faint or lightheaded -fever, chills -hair loss -hallucination, loss of contact with reality -headache -irritable -joint pain -loss of memory -muscle pain -muscle weakness -seizures -shortness of breath -signs and symptoms of high blood sugar such as dizziness; dry mouth; dry skin; fruity breath; nausea; stomach pain; increased hunger or thirst; increased urination -signs and symptoms of kidney injury like trouble passing urine or change in the amount of urine -signs and symptoms of liver injury like dark yellow or   brown urine; general ill feeling or flu-like symptoms; light-colored stools; loss of appetite; nausea; right upper belly pain; unusually weak or tired; yellowing of the eyes or  skin -stiff neck -swelling of the ankles, feet, hands -weight gain Side effects that usually do not require medical attention (report to your doctor or health care professional if they continue or are bothersome): -bone pain -constipation -tiredness -vomiting This list may not describe all possible side effects. Call your doctor for medical advice about side effects. You may report side effects to FDA at 1-800-FDA-1088. Where should I keep my medicine? This drug is given in a hospital or clinic and will not be stored at home. NOTE: This sheet is a summary. It may not cover all possible information. If you have questions about this medicine, talk to your doctor, pharmacist, or health care provider.    2016, Elsevier/Gold Standard. (2015-01-10 10:03:42)  Ipilimumab injection What is this medicine? IPILIMUMAB (IP i LIM ue mab) is a monoclonal antibody. It is used to treat melanoma, a type of skin cancer. This medicine may be used for other purposes; ask your health care provider or pharmacist if you have questions. What should I tell my health care provider before I take this medicine? They need to know if you have any of these conditions: -Addison's disease -blood in your stools (black or tarry stools) or if you have blood in your vomit -eye disease, vision problems -history of pancreatitis -history of stomach bleeding -immune system problems -inflammatory bowel disease -kidney disease -liver disease -lupus -myasthenia gravis -organ transplant -rheumatoid arthritis -sarcoidosis -stomach or intestine problems -thyroid disease -tingling of the fingers or toes, or other nerve disorder -an unusual or allergic reaction to ipilimumab, other medicines, foods, dyes, or preservatives -pregnant or trying to get pregnant -breast-feeding How should I use this medicine? This medicine is for infusion into a vein. It is given by a health care professional in a hospital or clinic  setting. A special MedGuide will be given to you before each treatment. Be sure to read this information carefully each time. Talk to your pediatrician regarding the use of this medicine in children. Special care may be needed. Overdosage: If you think you have taken too much of this medicine contact a poison control center or emergency room at once. NOTE: This medicine is only for you. Do not share this medicine with others. What if I miss a dose? It is important not to miss your dose. Call your doctor or health care professional if you are unable to keep an appointment. What may interact with this medicine? Interactions are not expected. This list may not describe all possible interactions. Give your health care provider a list of all the medicines, herbs, non-prescription drugs, or dietary supplements you use. Also tell them if you smoke, drink alcohol, or use illegal drugs. Some items may interact with your medicine. What should I watch for while using this medicine? Tell your doctor or healthcare professional if your symptoms do not start to get better or if they get worse. Do not become pregnant while taking this medicine or for 3 months after stopping it. Women should inform their doctor if they wish to become pregnant or think they might be pregnant. There is a potential for serious side effects to an unborn child. Talk to your health care professional or pharmacist for more information. Do not breast-feed an infant while taking this medicine or for 3 months after the last dose. Your condition will be monitored   carefully while you are receiving this medicine. You may need blood work done while you are taking this medicine. What side effects may I notice from receiving this medicine? Side effects that you should report to your doctor or health care professional as soon as possible: -allergic reactions like skin rash, itching or hives, swelling of the face, lips, or tongue -black, tarry  stools -bloody or watery diarrhea -changes in vision -dizziness -eye pain -fast, irregular heartbeat -feeling anxious -feeling faint or lightheaded, falls -nausea, vomiting -pain, tingling, numbness in the hands or feet -redness, blistering, peeling or loosening of the skin, including inside the mouth -signs and symptoms of liver injury like dark yellow or brown urine; general ill feeling or flu-like symptoms; light-colored stools; loss of appetite; nausea; right upper belly pain; unusually weak or tired; yellowing of the eyes or skin -unusual bleeding or bruising Side effects that usually do not require medical attention (Report these to your doctor or health care professional if they continue or are bothersome.): -headache -loss of appetite -trouble sleeping This list may not describe all possible side effects. Call your doctor for medical advice about side effects. You may report side effects to FDA at 1-800-FDA-1088. Where should I keep my medicine? This drug is given in a hospital or clinic and will not be stored at home. NOTE: This sheet is a summary. It may not cover all possible information. If you have questions about this medicine, talk to your doctor, pharmacist, or health care provider.    2016, Elsevier/Gold Standard. (2014-10-10 17:10:32)   

## 2015-08-27 ENCOUNTER — Encounter: Payer: Self-pay | Admitting: Hematology & Oncology

## 2015-08-28 ENCOUNTER — Other Ambulatory Visit: Payer: BLUE CROSS/BLUE SHIELD

## 2015-08-28 ENCOUNTER — Ambulatory Visit: Payer: BLUE CROSS/BLUE SHIELD | Admitting: Hematology & Oncology

## 2015-08-28 ENCOUNTER — Ambulatory Visit: Payer: BLUE CROSS/BLUE SHIELD

## 2015-08-29 ENCOUNTER — Ambulatory Visit: Payer: BLUE CROSS/BLUE SHIELD | Admitting: Hematology & Oncology

## 2015-08-29 ENCOUNTER — Other Ambulatory Visit: Payer: BLUE CROSS/BLUE SHIELD

## 2015-08-29 ENCOUNTER — Ambulatory Visit: Payer: BLUE CROSS/BLUE SHIELD

## 2015-09-03 ENCOUNTER — Telehealth: Payer: Self-pay | Admitting: *Deleted

## 2015-09-03 ENCOUNTER — Other Ambulatory Visit: Payer: Self-pay | Admitting: *Deleted

## 2015-09-03 DIAGNOSIS — C439 Malignant melanoma of skin, unspecified: Secondary | ICD-10-CM

## 2015-09-03 DIAGNOSIS — C799 Secondary malignant neoplasm of unspecified site: Secondary | ICD-10-CM

## 2015-09-03 MED ORDER — OLANZAPINE 5 MG PO TABS: 5.0000 mg | ORAL_TABLET | Freq: Two times a day (BID) | ORAL | Status: DC

## 2015-09-03 MED ORDER — LORAZEPAM 0.5 MG PO TABS
0.5000 mg | ORAL_TABLET | Freq: Four times a day (QID) | ORAL | Status: DC | PRN
Start: 2015-09-03 — End: 2015-10-22

## 2015-09-03 NOTE — Telephone Encounter (Signed)
Patient stating that she can't keep any solid food down. She can drink fluids without problem, but anytime she attempts solid food she vomits. She has tried compazine and zofran without success. She had used ativan previously, which worked with some success, but she no longer has a prescription for this.  Spoke to Dr Marin Olp. Altivan prescription called into patient pharmacy.   Dr Marin Olp would like patient to also start zyprexa. Prescription called in and patient aware.

## 2015-09-04 ENCOUNTER — Other Ambulatory Visit: Payer: BLUE CROSS/BLUE SHIELD

## 2015-09-04 ENCOUNTER — Ambulatory Visit: Payer: BLUE CROSS/BLUE SHIELD

## 2015-09-04 ENCOUNTER — Ambulatory Visit: Payer: BLUE CROSS/BLUE SHIELD | Admitting: Hematology & Oncology

## 2015-09-07 ENCOUNTER — Ambulatory Visit: Payer: BLUE CROSS/BLUE SHIELD | Admitting: Hematology & Oncology

## 2015-09-07 ENCOUNTER — Ambulatory Visit: Payer: BLUE CROSS/BLUE SHIELD

## 2015-09-07 ENCOUNTER — Other Ambulatory Visit: Payer: BLUE CROSS/BLUE SHIELD

## 2015-09-11 ENCOUNTER — Other Ambulatory Visit (HOSPITAL_BASED_OUTPATIENT_CLINIC_OR_DEPARTMENT_OTHER): Payer: BLUE CROSS/BLUE SHIELD

## 2015-09-11 ENCOUNTER — Other Ambulatory Visit: Payer: Self-pay | Admitting: Family

## 2015-09-11 ENCOUNTER — Ambulatory Visit (HOSPITAL_BASED_OUTPATIENT_CLINIC_OR_DEPARTMENT_OTHER)
Admission: RE | Admit: 2015-09-11 | Discharge: 2015-09-11 | Disposition: A | Discharge status: Home / Self Care | Payer: BLUE CROSS/BLUE SHIELD | Source: Ambulatory Visit | Attending: Hematology & Oncology | Admitting: Hematology & Oncology

## 2015-09-11 ENCOUNTER — Ambulatory Visit (HOSPITAL_BASED_OUTPATIENT_CLINIC_OR_DEPARTMENT_OTHER): Payer: BLUE CROSS/BLUE SHIELD | Admitting: Hematology & Oncology

## 2015-09-11 ENCOUNTER — Ambulatory Visit (HOSPITAL_BASED_OUTPATIENT_CLINIC_OR_DEPARTMENT_OTHER): Payer: BLUE CROSS/BLUE SHIELD

## 2015-09-11 VITALS — BP 127/61 | HR 118 | Temp 97.8°F | Resp 20 | Wt 123.0 lb

## 2015-09-11 DIAGNOSIS — R112 Nausea with vomiting, unspecified: Secondary | ICD-10-CM

## 2015-09-11 DIAGNOSIS — R197 Diarrhea, unspecified: Secondary | ICD-10-CM

## 2015-09-11 DIAGNOSIS — C439 Malignant melanoma of skin, unspecified: Secondary | ICD-10-CM

## 2015-09-11 DIAGNOSIS — C799 Secondary malignant neoplasm of unspecified site: Secondary | ICD-10-CM

## 2015-09-11 LAB — CBC WITH DIFFERENTIAL (CANCER CENTER ONLY)
BASO#: 0 10*3/uL (ref 0.0–0.2)
BASO%: 0.2 % (ref 0.0–2.0)
EOS%: 0.7 % (ref 0.0–7.0)
Eosinophils Absolute: 0.1 10*3/uL (ref 0.0–0.5)
HEMATOCRIT: 38 % (ref 34.8–46.6)
HEMOGLOBIN: 12.1 g/dL (ref 11.6–15.9)
LYMPH#: 3.2 10*3/uL (ref 0.9–3.3)
LYMPH%: 16.5 % (ref 14.0–48.0)
MCH: 25 pg — ABNORMAL LOW (ref 26.0–34.0)
MCHC: 31.8 g/dL — AB (ref 32.0–36.0)
MCV: 79 fL — ABNORMAL LOW (ref 81–101)
MONO#: 1.5 10*3/uL — ABNORMAL HIGH (ref 0.1–0.9)
MONO%: 7.8 % (ref 0.0–13.0)
NEUT%: 74.8 % (ref 39.6–80.0)
NEUTROS ABS: 14.6 10*3/uL — AB (ref 1.5–6.5)
Platelets: 385 10*3/uL (ref 145–400)
RBC: 4.84 10*6/uL (ref 3.70–5.32)
RDW: 14.9 % (ref 11.1–15.7)
WBC: 19.5 10*3/uL — AB (ref 3.9–10.0)

## 2015-09-11 LAB — COMPREHENSIVE METABOLIC PANEL
ANION GAP: 15 meq/L — AB (ref 3–11)
AST: 13 U/L (ref 5–34)
Albumin: 2.7 g/dL — ABNORMAL LOW (ref 3.5–5.0)
Alkaline Phosphatase: 75 U/L (ref 40–150)
BUN: 7.8 mg/dL (ref 7.0–26.0)
CHLORIDE: 93 meq/L — AB (ref 98–109)
CO2: 22 meq/L (ref 22–29)
CREATININE: 0.6 mg/dL (ref 0.6–1.1)
Calcium: 9.1 mg/dL (ref 8.4–10.4)
EGFR: >90 mL/min/{1.73_m2} (ref >90)
GLUCOSE: 77 mg/dL (ref 70–140)
Potassium: 3.4 mEq/L — ABNORMAL LOW (ref 3.5–5.1)
SODIUM: 130 meq/L — AB (ref 136–145)
Total Bilirubin: 0.41 mg/dL (ref 0.20–1.20)
Total Protein: 6.7 g/dL (ref 6.4–8.3)

## 2015-09-11 LAB — LACTATE DEHYDROGENASE: LDH: 270 U/L — ABNORMAL HIGH (ref 125–245)

## 2015-09-11 LAB — TSH: TSH: 0.231 m[IU]/L — AB (ref 0.308–3.960)

## 2015-09-11 MED ORDER — FAMOTIDINE IN NACL 20-0.9 MG/50ML-% IV SOLN
40.0000 mg | Freq: Two times a day (BID) | INTRAVENOUS | Status: DC
  Administered 2015-09-11: 40 mg via INTRAVENOUS

## 2015-09-11 MED ORDER — METHYLPREDNISOLONE SODIUM SUCC 125 MG IJ SOLR
125.0000 mg | Freq: Once | INTRAMUSCULAR | Status: AC
  Administered 2015-09-11: 125 mg via INTRAVENOUS

## 2015-09-11 MED ORDER — SODIUM CHLORIDE 0.9 % IJ SOLN
10.0000 mL | INTRAMUSCULAR | Status: DC | PRN
  Administered 2015-09-11: 10 mL via INTRAVENOUS
  Filled 2015-09-11: qty 10

## 2015-09-11 MED ORDER — SODIUM CHLORIDE 0.9 % IV SOLN
INTRAVENOUS | Status: AC
  Administered 2015-09-11: 13:00:00 via INTRAVENOUS

## 2015-09-11 MED ORDER — METHYLPREDNISOLONE SODIUM SUCC 125 MG IJ SOLR
INTRAMUSCULAR | Status: AC
  Filled 2015-09-11: qty 2

## 2015-09-11 MED ORDER — FAMOTIDINE IN NACL 20-0.9 MG/50ML-% IV SOLN
INTRAVENOUS | Status: AC
  Filled 2015-09-11: qty 100

## 2015-09-11 MED ORDER — HEPARIN SOD (PORK) LOCK FLUSH 100 UNIT/ML IV SOLN
500.0000 [IU] | Freq: Once | INTRAVENOUS | Status: AC
  Administered 2015-09-11: 500 [IU] via INTRAVENOUS
  Filled 2015-09-11: qty 5

## 2015-09-11 MED ORDER — SODIUM CHLORIDE 0.9 % IV SOLN: Freq: Once | INTRAVENOUS | Status: DC

## 2015-09-11 MED ORDER — SODIUM CHLORIDE 0.9 % IV SOLN
Freq: Once | INTRAVENOUS | Status: AC
  Administered 2015-09-11: 15:00:00 via INTRAVENOUS
  Filled 2015-09-11: qty 4

## 2015-09-11 NOTE — Patient Instructions (Signed)

## 2015-09-11 NOTE — Progress Notes (Signed)
Hematology and Oncology Follow Up Visit  Felicia Richmond [id number] [birth year] 55 y.o. 09/11/2015   Principle Diagnosis:  Metastatic melanoma- BRAF (+) - progressive  Current Therapy:  S/p cycle #1 of Ipilimumab/Nivolumab    Interim History:  Ms. Felicia Richmond is here today with her husband and friends for follow-up. Unfortunately, she still have a very tough time. She says she is not able to eat for a week or so. Every time she tries to eat, she says she throws up.  She is now having diarrhea. Before, she was having problems with constipation. We had her on medication for constipation. She stopped taking this.  She's not taking with the way medicines is out of her pain medications right now. She says that most of which she takes she just throws up.  She feels that whenever she tries in, food just gets stuck in her stomach.  We did go ahead and get a KUB of her abdomen. There is no obstruction. There is no ileus. She had a nondescript bowel gas pattern.  She's had no fever. She's had no bleeding. Patient still has pain under the right breast where she has this tumor.  It looks as if the subcutaneous tumors might be getting a little bit better.  I told her family that he takes about 6-8 weeks before he see a response with immunotherapy. I does want to try to get her to that point in time if possible.  She's had no fever. She's had no dysuria.  There's not been any rashes. She's had no leg swelling.  Overall, her performance status is ECOG 2-3.  Medications:    Medication List       This list is accurate as of: 09/11/15  5:11 PM.  Always use your most recent med list.               atorvastatin 40 MG tablet  Commonly known as:  LIPITOR  Take 1 tablet (40 mg total) by mouth daily.     dronabinol 5 MG capsule  Commonly known as:  MARINOL  Take 1 capsule (5 mg total) by mouth 2 (two) times daily before a meal.     ibuprofen 200 MG tablet  Commonly known as:  ADVIL,MOTRIN  Take  400 mg by mouth every 6 (six) hours as needed for mild pain. Reported on 09/11/2015     lactulose 10 GM/15ML solution  Commonly known as:  CHRONULAC  Take 15 mLs (10 g total) by mouth 3 (three) times daily.     lidocaine-prilocaine cream  Commonly known as:  EMLA  Apply to affected area once     LORazepam 0.5 MG tablet  Commonly known as:  ATIVAN  Take 1 tablet (0.5 mg total) by mouth every 6 (six) hours as needed (Nausea or vomiting).     Meclizine HCl 25 MG Chew  Chew 1 tablet (25 mg total) by mouth daily as needed (dizziness).     methimazole 5 MG tablet  Commonly known as:  TAPAZOLE  Take 5 mg by mouth daily.     morphine 15 MG tablet  Commonly known as:  MSIR  Take 1 tablet (15 mg total) by mouth every 6 (six) hours as needed for severe pain.     morphine 30 MG 12 hr tablet  Commonly known as:  MS CONTIN  Take 1 tablet (30 mg total) by mouth every 12 (twelve) hours.     naloxegol oxalate 25 MG Tabs tablet  Commonly known as:  MOVANTIK  Take 1 tablet (25 mg total) by mouth daily.     OLANZapine 5 MG tablet  Commonly known as:  ZYPREXA  Take 1 tablet (5 mg total) by mouth 2 (two) times daily.     potassium chloride 10 MEQ tablet  Commonly known as:  K-DUR  Take 1 tablet (10 mEq total) by mouth daily.        Allergies:  Allergies  Allergen Reactions  . Cotellic [Cobimetinib] Diarrhea  . Oxycodone Nausea And Vomiting  . Pseudoephedrine Other (See Comments)    Makes patient feel weird  . Scopolamine Other (See Comments)  . Tramadol     Other reaction(s): GI Upset (intolerance)    Past Medical History, Surgical history, Social history, and Family History were reviewed and updated.  Review of Systems: All other 10 point review of systems is negative.   Physical Exam:  weight is 123 lb (55.792 kg). Her oral temperature is 97.8 F (36.6 C). Her blood pressure is 127/61 and her pulse is 118. Her respiration is 20.   Wt Readings from Last 3 Encounters:    09/11/15 123 lb (55.792 kg)  08/14/15 139 lb (63.05 kg)  08/07/15 141 lb (63.957 kg)    Chronically ill-appearing, thin white female in no obvious distress. Head and neck exam shows no scleral icterus. She has no adenopathy in the neck. There is no oral lesions. Her oral mucosa might be a little bit dry. Her thyroid is nonpalpable. Lungs are clear bilaterally. She has no wheezes. Cardiac exam tachycardic but regular. She has no murmurs, rubs or bruits. Breast exam shows the area of firmness and erythema under the right breast. This, to me, actually looks a little bit better. No abnormalities noted with the left breast. There is no bilateral axillary adenopathy. Abdomen is soft. She has decreased bowel sounds. There is no fluid wave. There is no guarding. There is no subcutaneous masses. She has no palpable liver or spleen tip. Back exam shows the subcutaneous met in the left parathoracic region to be less prominent. Chest wall exam shows the subcutaneous metastasis in the left upper anterior chest wall to be stable to maybe slightly improved. Extremities shows no clubbing, cyanosis or edema. She has decent strength bilaterally. Skin exam shows the changes listed above. Neurological exam is nonfocal.    Lab Results  Component Value Date   WBC 19.5* 09/11/2015   HGB 12.1 09/11/2015   HCT 38.0 09/11/2015   MCV 79* 09/11/2015   PLT 385 09/11/2015   No results found for: FERRITIN, IRON, TIBC, UIBC, IRONPCTSAT Lab Results  Component Value Date   RBC 4.84 09/11/2015   No results found for: KPAFRELGTCHN, LAMBDASER, KAPLAMBRATIO No results found for: IGGSERUM, IGA, IGMSERUM No results found for: Odetta Pink, SPEI   Chemistry      Component Value Date/Time   NA 130* 09/11/2015 1139   NA 133 08/14/2015 1041   NA 131* 07/27/2015 1350   K 3.4* 09/11/2015 1139   K 4.2 08/14/2015 1041   K 3.7 07/27/2015 1350   CL 99 08/14/2015 1041   CL 98*  07/27/2015 1350   CO2 22 09/11/2015 1139   CO2 25 08/14/2015 1041   CO2 24 07/27/2015 1350   BUN 7.8 09/11/2015 1139   BUN 7 08/14/2015 1041   BUN 12 07/27/2015 1350   CREATININE 0.6 09/11/2015 1139   CREATININE 0.5* 08/14/2015 1041   CREATININE 0.60 07/27/2015 1350  Component Value Date/Time   CALCIUM 9.1 09/11/2015 1139   CALCIUM 8.4 08/14/2015 1041   CALCIUM 8.7* 07/27/2015 1350   ALKPHOS 75 09/11/2015 1139   ALKPHOS 85* 08/14/2015 1041   ALKPHOS 115 07/27/2015 1350   AST 13 09/11/2015 1139   AST 19 08/14/2015 1041   AST 15 07/27/2015 1350   ALT <9 09/11/2015 1139   ALT 13 08/14/2015 1041   ALT 13* 07/27/2015 1350   BILITOT 0.41 09/11/2015 1139   BILITOT 0.50 08/14/2015 1041   BILITOT 0.4 07/27/2015 1350     Impression and Plan: Ms. Levandowski is a 55 yo white female with metastatic melanoma. The diarrhea concert maybe from the [place]. I think that we have to get her on some steroids. I don't think this is infectious.  She is clearly dehydrated. As such, we will give her [place] to IV fluids. We will also give her some IV Pepcid area and I will give her some IV antibiotics. I will give her some IV Solu-Medrol.  I want her to come in daily for the next 3 days. I think we really need to proceed IV fluids on her. Hopefully this will help her be able to eat and hopefully help with the diarrhea.  I spent about 45 minutes with she and her family. This is really a difficult situation. I would've thought that she would have had a more profound response to date to immunotherapy but this just may take a while.  Volanda Napoleon, MD 1/17/20175:11 PM

## 2015-09-11 NOTE — Progress Notes (Signed)
To return tomorrow for fluids, meds, and repeat labs.  Pt and family aware of 1230 schedule.

## 2015-09-12 ENCOUNTER — Other Ambulatory Visit (HOSPITAL_BASED_OUTPATIENT_CLINIC_OR_DEPARTMENT_OTHER): Payer: BLUE CROSS/BLUE SHIELD

## 2015-09-12 ENCOUNTER — Telehealth: Payer: Self-pay | Admitting: *Deleted

## 2015-09-12 ENCOUNTER — Other Ambulatory Visit: Payer: Self-pay | Admitting: Family

## 2015-09-12 ENCOUNTER — Other Ambulatory Visit: Payer: Self-pay | Admitting: *Deleted

## 2015-09-12 ENCOUNTER — Ambulatory Visit (HOSPITAL_BASED_OUTPATIENT_CLINIC_OR_DEPARTMENT_OTHER): Payer: BLUE CROSS/BLUE SHIELD

## 2015-09-12 ENCOUNTER — Other Ambulatory Visit: Payer: Self-pay | Admitting: Hematology & Oncology

## 2015-09-12 VITALS — BP 96/49 | HR 87 | Temp 97.4°F

## 2015-09-12 DIAGNOSIS — C439 Malignant melanoma of skin, unspecified: Secondary | ICD-10-CM

## 2015-09-12 DIAGNOSIS — R110 Nausea: Secondary | ICD-10-CM

## 2015-09-12 DIAGNOSIS — C799 Secondary malignant neoplasm of unspecified site: Secondary | ICD-10-CM

## 2015-09-12 DIAGNOSIS — E876 Hypokalemia: Secondary | ICD-10-CM

## 2015-09-12 DIAGNOSIS — R5383 Other fatigue: Secondary | ICD-10-CM | POA: Diagnosis not present

## 2015-09-12 DIAGNOSIS — N61 Mastitis without abscess: Secondary | ICD-10-CM

## 2015-09-12 LAB — CMP (CANCER CENTER ONLY)
ALBUMIN: 2.7 g/dL — AB (ref 3.3–5.5)
ALT(SGPT): 13 U/L (ref 10–47)
AST: 19 U/L (ref 11–38)
Alkaline Phosphatase: 58 U/L (ref 26–84)
BUN, Bld: 8 mg/dL (ref 7–22)
CALCIUM: 8.6 mg/dL (ref 8.0–10.3)
CHLORIDE: 98 meq/L (ref 98–108)
CO2: 25 meq/L (ref 18–33)
CREATININE: 0.6 mg/dL (ref 0.6–1.2)
GLUCOSE: 109 mg/dL (ref 73–118)
Potassium: 3 mEq/L — CL (ref 3.3–4.7)
Sodium: 132 mEq/L (ref 128–145)
TOTAL PROTEIN: 6.6 g/dL (ref 6.4–8.1)
Total Bilirubin: 0.5 mg/dl (ref 0.20–1.60)

## 2015-09-12 LAB — CBC WITH DIFFERENTIAL (CANCER CENTER ONLY)
BASO#: 0.1 10*3/uL (ref 0.0–0.2)
BASO%: 0.2 % (ref 0.0–2.0)
EOS ABS: 0.1 10*3/uL (ref 0.0–0.5)
EOS%: 0.5 % (ref 0.0–7.0)
HEMATOCRIT: 37.6 % (ref 34.8–46.6)
HEMOGLOBIN: 12 g/dL (ref 11.6–15.9)
LYMPH#: 4.4 10*3/uL — AB (ref 0.9–3.3)
LYMPH%: 18.4 % (ref 14.0–48.0)
MCH: 25.4 pg — AB (ref 26.0–34.0)
MCHC: 31.9 g/dL — AB (ref 32.0–36.0)
MCV: 80 fL — ABNORMAL LOW (ref 81–101)
MONO#: 1.9 10*3/uL — ABNORMAL HIGH (ref 0.1–0.9)
MONO%: 8.2 % (ref 0.0–13.0)
NEUT%: 72.7 % (ref 39.6–80.0)
NEUTROS ABS: 17.2 10*3/uL — AB (ref 1.5–6.5)
Platelets: 472 10*3/uL — ABNORMAL HIGH (ref 145–400)
RBC: 4.73 10*6/uL (ref 3.70–5.32)
RDW: 14.9 % (ref 11.1–15.7)
WBC: 23.6 10*3/uL — AB (ref 3.9–10.0)

## 2015-09-12 LAB — PREALBUMIN: PREALBUMIN: 8 mg/dL — AB (ref 10–36)

## 2015-09-12 MED ORDER — FAMOTIDINE IN NACL 20-0.9 MG/50ML-% IV SOLN
INTRAVENOUS | Status: AC
  Filled 2015-09-12: qty 100

## 2015-09-12 MED ORDER — HEPARIN SOD (PORK) LOCK FLUSH 100 UNIT/ML IV SOLN
500.0000 [IU] | Freq: Once | INTRAVENOUS | Status: AC
  Administered 2015-09-12: 500 [IU] via INTRAVENOUS
  Filled 2015-09-12: qty 5

## 2015-09-12 MED ORDER — FAMOTIDINE IN NACL 20-0.9 MG/50ML-% IV SOLN
40.0000 mg | Freq: Two times a day (BID) | INTRAVENOUS | Status: DC
  Administered 2015-09-12: 40 mg via INTRAVENOUS

## 2015-09-12 MED ORDER — METHYLPREDNISOLONE SODIUM SUCC 125 MG IJ SOLR
125.0000 mg | Freq: Once | INTRAMUSCULAR | Status: AC
  Administered 2015-09-12: 125 mg via INTRAVENOUS

## 2015-09-12 MED ORDER — METHYLPREDNISOLONE SODIUM SUCC 125 MG IJ SOLR
INTRAMUSCULAR | Status: AC
  Filled 2015-09-12: qty 2

## 2015-09-12 MED ORDER — POTASSIUM CHLORIDE 10 MEQ/100ML IV SOLN: 10.0000 meq | INTRAVENOUS | Status: DC

## 2015-09-12 MED ORDER — SODIUM CHLORIDE 0.9 % IJ SOLN
10.0000 mL | INTRAMUSCULAR | Status: DC | PRN
  Administered 2015-09-12: 10 mL via INTRAVENOUS
  Filled 2015-09-12: qty 10

## 2015-09-12 MED ORDER — MORPHINE SULFATE 15 MG PO TABS: 15.0000 mg | ORAL_TABLET | Freq: Four times a day (QID) | ORAL | Status: DC | PRN

## 2015-09-12 MED ORDER — MORPHINE SULFATE ER 30 MG PO TBCR: 30.0000 mg | EXTENDED_RELEASE_TABLET | Freq: Two times a day (BID) | ORAL | Status: DC

## 2015-09-12 MED ORDER — SODIUM CHLORIDE 0.9 % IV SOLN
Freq: Once | INTRAVENOUS | Status: AC
  Administered 2015-09-12: 13:00:00 via INTRAVENOUS

## 2015-09-12 MED ORDER — SODIUM CHLORIDE 0.9 % IV SOLN
Freq: Once | INTRAVENOUS | Status: AC
  Administered 2015-09-12: 14:00:00 via INTRAVENOUS
  Filled 2015-09-12: qty 250

## 2015-09-12 NOTE — Telephone Encounter (Signed)
Critical Value Potassium 3.0 Dr Ennever notified. Orders will be placed 

## 2015-09-12 NOTE — Patient Instructions (Signed)
Dehydration, Adult Dehydration is a condition in which you do not have enough fluid or water in your body. It happens when you take in less fluid than you lose. Vital organs such as the kidneys, brain, and heart cannot function without a proper amount of fluids. Any loss of fluids from the body can cause dehydration.  Dehydration can range from mild to severe. This condition should be treated right away to help prevent it from becoming severe. CAUSES  This condition may be caused by:  Vomiting.  Diarrhea.  Excessive sweating, such as when exercising in hot or humid weather.  Not drinking enough fluid during strenuous exercise or during an illness.  Excessive urine output.  Fever.  Certain medicines. RISK FACTORS This condition is more likely to develop in:  People who are taking certain medicines that cause the body to lose excess fluid (diuretics).   People who have a chronic illness, such as diabetes, that may increase urination.  Older adults.   People who live at high altitudes.   People who participate in endurance sports.  SYMPTOMS  Mild Dehydration  Thirst.  Dry lips.  Slightly dry mouth.  Dry, warm skin. Moderate Dehydration  Very dry mouth.   Muscle cramps.   Dark urine and decreased urine production.   Decreased tear production.   Headache.   Light-headedness, especially when you stand up from a sitting position.  Severe Dehydration  Changes in skin.   Cold and clammy skin.   Skin does not spring back quickly when lightly pinched and released.   Changes in body fluids.   Extreme thirst.   No tears.   Not able to sweat when body temperature is high, such as in hot weather.   Minimal urine production.   Changes in vital signs.   Rapid, weak pulse (more than 100 beats per minute when you are sitting still).   Rapid breathing.   Low blood pressure.   Other changes.   Sunken eyes.   Cold hands and feet.    Confusion.  Lethargy and difficulty being awakened.  Fainting (syncope).   Short-term weight loss.   Unconsciousness. DIAGNOSIS  This condition may be diagnosed based on your symptoms. You may also have tests to determine how severe your dehydration is. These tests may include:   Urine tests.   Blood tests.  TREATMENT  Treatment for this condition depends on the severity. Mild or moderate dehydration can often be treated at home. Treatment should be started right away. Do not wait until dehydration becomes severe. Severe dehydration needs to be treated at the hospital. Treatment for Mild Dehydration  Drinking plenty of water to replace the fluid you have lost.   Replacing minerals in your blood (electrolytes) that you may have lost.  Treatment for Moderate Dehydration  Consuming oral rehydration solution (ORS). Treatment for Severe Dehydration  Receiving fluid through an IV tube.   Receiving electrolyte solution through a feeding tube that is passed through your nose and into your stomach (nasogastric tube or NG tube).  Correcting any abnormalities in electrolytes. HOME CARE INSTRUCTIONS   Drink enough fluid to keep your urine clear or pale yellow.   Drink water or fluid slowly by taking small sips. You can also try sucking on ice cubes.  Have food or beverages that contain electrolytes. Examples include bananas and sports drinks.  Take over-the-counter and prescription medicines only as told by your health care provider.   Prepare ORS according to the manufacturer's instructions. Take sips   of ORS every 5 minutes until your urine returns to normal.  If you have vomiting or diarrhea, continue to try to drink water, ORS, or both.   If you have diarrhea, avoid:   Beverages that contain caffeine.   Fruit juice.   Milk.   Carbonated soft drinks.  Do not take salt tablets. This can lead to the condition of having too much sodium in your body  (hypernatremia).  SEEK MEDICAL CARE IF:  You cannot eat or drink without vomiting.  You have had moderate diarrhea during a period of more than 24 hours.  You have a fever. SEEK IMMEDIATE MEDICAL CARE IF:   You have extreme thirst.  You have severe diarrhea.  You have not urinated in 6-8 hours, or you have urinated only a small amount of very dark urine.  You have shriveled skin.  You are dizzy, confused, or both.   This information is not intended to replace advice given to you by your health care provider. Make sure you discuss any questions you have with your health care provider.   Document Released: 08/11/2005 Document Revised: 05/02/2015 Document Reviewed: 12/27/2014 Elsevier Interactive Patient Education 2016 [employer]   Hypokalemia Hypokalemia means that the amount of potassium in the blood is lower than normal.Potassium is a chemical, called an electrolyte, that helps regulate the amount of fluid in the body. It also stimulates muscle contraction and helps nerves function properly.Most of the body's potassium is inside of cells, and only a very small amount is in the blood. Because the amount in the blood is so small, minor changes can be life-threatening. CAUSES  Antibiotics.  Diarrhea or vomiting.  Using laxatives too much, which can cause diarrhea.  Chronic kidney disease.  Water pills (diuretics).  Eating disorders (bulimia).  Low magnesium level.  Sweating a lot. SIGNS AND SYMPTOMS  Weakness.  Constipation.  Fatigue.  Muscle cramps.  Mental confusion.  Skipped heartbeats or irregular heartbeat (palpitations).  Tingling or numbness. DIAGNOSIS  Your health care provider can diagnose hypokalemia with blood tests. In addition to checking your potassium level, your health care provider may also check other lab tests. TREATMENT Hypokalemia can be treated with potassium supplements taken by mouth or adjustments in your current medicines.  If your potassium level is very low, you may need to get potassium through a vein (IV) and be monitored in the hospital. A diet high in potassium is also helpful. Foods high in potassium are:  Nuts, such as peanuts and pistachios.  Seeds, such as sunflower seeds and pumpkin seeds.  Peas, lentils, and lima beans.  Whole grain and bran cereals and breads.  Fresh fruit and vegetables, such as apricots, avocado, bananas, cantaloupe, kiwi, oranges, tomatoes, asparagus, and potatoes.  Orange and tomato juices.  Red meats.  Fruit yogurt. HOME CARE INSTRUCTIONS  Take all medicines as prescribed by your health care provider.  Maintain a healthy diet by including nutritious food, such as fruits, vegetables, nuts, whole grains, and lean meats.  If you are taking a laxative, be sure to follow the directions on the label. SEEK MEDICAL CARE IF:  Your weakness gets worse.  You feel your heart pounding or racing.  You are vomiting or having diarrhea.  You are diabetic and having trouble keeping your blood glucose in the normal range. SEEK IMMEDIATE MEDICAL CARE IF:  You have chest pain, shortness of breath, or dizziness.  You are vomiting or having diarrhea for more than 2 days.  You faint. MAKE   SURE YOU:   Understand these instructions.  Will watch your condition.  Will get help right away if you are not doing well or get worse.   This information is not intended to replace advice given to you by your health care provider. Make sure you discuss any questions you have with your health care provider.   Document Released: 08/11/2005 Document Revised: 09/01/2014 Document Reviewed: 02/11/2013 Elsevier Interactive Patient Education 2016 [employer]  Famotidine injection What is this medicine? FAMOTIDINE (fa MOE ti deen) is a type of antihistamine that blocks the release of stomach acid. It is used to treat stomach or intestinal ulcers. It can relieve ulcer pain and discomfort,  and the heartburn from acid reflux. This medicine may be used for other purposes; ask your health care provider or pharmacist if you have questions. What should I tell my health care provider before I take this medicine? They need to know if you have any of these conditions: -kidney or liver disease -an unusual or allergic reaction to famotidine, other medicines, foods, dyes, or preservatives -pregnant or trying to get pregnant -breast-feeding How should I use this medicine? This medicine is for infusion into a vein. It is given by a health care professional in a hospital or clinic setting. Talk to your pediatrician regarding the use of this medicine in children. Special care may be needed. Overdosage: If you think you have taken too much of this medicine contact a poison control center or emergency room at once. NOTE: This medicine is only for you. Do not share this medicine with others. What if I miss a dose? This does not apply. What may interact with this medicine? -delavirdine -itraconazole -ketoconazole This list may not describe all possible interactions. Give your health care provider a list of all the medicines, herbs, non-prescription drugs, or dietary supplements you use. Also tell them if you smoke, drink alcohol, or use illegal drugs. Some items may interact with your medicine. What should I watch for while using this medicine? Tell your doctor or health care professional if your condition does not start to get better or gets worse. Do not take with aspirin, ibuprofen, or other antiinflammatory medicines. These can aggravate your condition. Do not smoke cigarettes or drink alcohol. These increase irritation in your stomach and can increase the time it will take for ulcers to heal. Cigarettes and alcohol can also worsen acid reflux or heartburn. If you get black, tarry stools or vomit up what looks like coffee grounds, call your doctor or health care professional at once. You may  have a bleeding ulcer. What side effects may I notice from receiving this medicine? Side effects that you should report to your doctor or health care professional as soon as possible: -allergic reactions like skin rash, itching or hives, swelling of the face, lips, or tongue -agitation, nervousness -confusion -hallucinations Side effects that usually do not require medical attention (report to your doctor or health care professional if they continue or are bothersome): -constipation -diarrhea -dizziness -headache This list may not describe all possible side effects. Call your doctor for medical advice about side effects. You may report side effects to FDA at 1-800-FDA-1088. Where should I keep my medicine? This medicine is given in a hospital or clinic. You will not be given this medicine to store at home. NOTE: This sheet is a summary. It may not cover all possible information. If you have questions about this medicine, talk to your doctor, pharmacist, or health care provider.  2016, [employer]. (2007-12-15 13:24:51)

## 2015-09-12 NOTE — Addendum Note (Signed)
Addended by: Henreitta Leber E on: 09/12/2015 01:57 PM   Modules accepted: Orders

## 2015-09-13 ENCOUNTER — Other Ambulatory Visit: Payer: Self-pay | Admitting: Family

## 2015-09-13 ENCOUNTER — Ambulatory Visit (HOSPITAL_BASED_OUTPATIENT_CLINIC_OR_DEPARTMENT_OTHER): Payer: BLUE CROSS/BLUE SHIELD

## 2015-09-13 ENCOUNTER — Other Ambulatory Visit (HOSPITAL_BASED_OUTPATIENT_CLINIC_OR_DEPARTMENT_OTHER): Payer: BLUE CROSS/BLUE SHIELD

## 2015-09-13 VITALS — BP 120/58 | HR 94 | Temp 98.1°F | Resp 18

## 2015-09-13 DIAGNOSIS — C799 Secondary malignant neoplasm of unspecified site: Secondary | ICD-10-CM

## 2015-09-13 DIAGNOSIS — C439 Malignant melanoma of skin, unspecified: Secondary | ICD-10-CM

## 2015-09-13 LAB — CMP (CANCER CENTER ONLY)
ALT(SGPT): 13 U/L (ref 10–47)
AST: 24 U/L (ref 11–38)
Albumin: 2.8 g/dL — ABNORMAL LOW (ref 3.3–5.5)
Alkaline Phosphatase: 53 U/L (ref 26–84)
BUN: 8 mg/dL (ref 7–22)
CHLORIDE: 98 meq/L (ref 98–108)
CO2: 25 meq/L (ref 18–33)
CREATININE: 0.9 mg/dL (ref 0.6–1.2)
Calcium: 8.6 mg/dL (ref 8.0–10.3)
GLUCOSE: 88 mg/dL (ref 73–118)
POTASSIUM: 3.1 meq/L — AB (ref 3.3–4.7)
SODIUM: 135 meq/L (ref 128–145)
Total Bilirubin: 0.4 mg/dl (ref 0.20–1.60)
Total Protein: 6.3 g/dL — ABNORMAL LOW (ref 6.4–8.1)

## 2015-09-13 LAB — CBC WITH DIFFERENTIAL (CANCER CENTER ONLY)
BASO#: 0.1 10*3/uL (ref 0.0–0.2)
BASO%: 0.2 % (ref 0.0–2.0)
EOS ABS: 0.1 10*3/uL (ref 0.0–0.5)
EOS%: 0.4 % (ref 0.0–7.0)
HCT: 37.1 % (ref 34.8–46.6)
HEMOGLOBIN: 11.6 g/dL (ref 11.6–15.9)
LYMPH#: 4.8 10*3/uL — ABNORMAL HIGH (ref 0.9–3.3)
LYMPH%: 21.3 % (ref 14.0–48.0)
MCH: 25.2 pg — AB (ref 26.0–34.0)
MCHC: 31.3 g/dL — ABNORMAL LOW (ref 32.0–36.0)
MCV: 81 fL (ref 81–101)
MONO#: 1.5 10*3/uL — ABNORMAL HIGH (ref 0.1–0.9)
MONO%: 6.6 % (ref 0.0–13.0)
NEUT%: 71.5 % (ref 39.6–80.0)
NEUTROS ABS: 16.2 10*3/uL — AB (ref 1.5–6.5)
PLATELETS: 525 10*3/uL — AB (ref 145–400)
RBC: 4.61 10*6/uL (ref 3.70–5.32)
RDW: 15.1 % (ref 11.1–15.7)
WBC: 22.7 10*3/uL — AB (ref 3.9–10.0)

## 2015-09-13 MED ORDER — FAMOTIDINE IN NACL 20-0.9 MG/50ML-% IV SOLN
40.0000 mg | Freq: Once | INTRAVENOUS | Status: AC
  Administered 2015-09-13: 40 mg via INTRAVENOUS

## 2015-09-13 MED ORDER — SODIUM CHLORIDE 0.9 % IJ SOLN
10.0000 mL | INTRAMUSCULAR | Status: DC | PRN
  Administered 2015-09-13: 10 mL via INTRAVENOUS
  Filled 2015-09-13: qty 10

## 2015-09-13 MED ORDER — METHYLPREDNISOLONE SODIUM SUCC 125 MG IJ SOLR
INTRAMUSCULAR | Status: AC
  Filled 2015-09-13: qty 2

## 2015-09-13 MED ORDER — FAMOTIDINE IN NACL 20-0.9 MG/50ML-% IV SOLN
INTRAVENOUS | Status: AC
  Filled 2015-09-13: qty 100

## 2015-09-13 MED ORDER — HEPARIN SOD (PORK) LOCK FLUSH 100 UNIT/ML IV SOLN
500.0000 [IU] | Freq: Once | INTRAVENOUS | Status: AC
  Administered 2015-09-13: 500 [IU] via INTRAVENOUS
  Filled 2015-09-13: qty 5

## 2015-09-13 MED ORDER — METHYLPREDNISOLONE SODIUM SUCC 125 MG IJ SOLR
80.0000 mg | Freq: Once | INTRAMUSCULAR | Status: AC
  Administered 2015-09-13: 80 mg via INTRAVENOUS

## 2015-09-13 MED ORDER — SODIUM CHLORIDE 0.9 % IV SOLN
1000.0000 mL | Freq: Once | INTRAVENOUS | Status: AC
  Administered 2015-09-13: 1000 mL via INTRAVENOUS

## 2015-09-13 MED ORDER — FAMOTIDINE IN NACL 20-0.9 MG/50ML-% IV SOLN
INTRAVENOUS | Status: AC
Start: 2015-09-13 — End: 2015-09-13
  Filled 2015-09-13: qty 100

## 2015-09-13 NOTE — Patient Instructions (Addendum)
Dehydration, Adult Dehydration is a condition in which you do not have enough fluid or water in your body. It happens when you take in less fluid than you lose. Vital organs such as the kidneys, brain, and heart cannot function without a proper amount of fluids. Any loss of fluids from the body can cause dehydration.  Dehydration can range from mild to severe. This condition should be treated right away to help prevent it from becoming severe. CAUSES  This condition may be caused by:  Vomiting.  Diarrhea.  Excessive sweating, such as when exercising in hot or humid weather.  Not drinking enough fluid during strenuous exercise or during an illness.  Excessive urine output.  Fever.  Certain medicines. RISK FACTORS This condition is more likely to develop in:  People who are taking certain medicines that cause the body to lose excess fluid (diuretics).   People who have a chronic illness, such as diabetes, that may increase urination.  Older adults.   People who live at high altitudes.   People who participate in endurance sports.  SYMPTOMS  Mild Dehydration  Thirst.  Dry lips.  Slightly dry mouth.  Dry, warm skin. Moderate Dehydration  Very dry mouth.   Muscle cramps.   Dark urine and decreased urine production.   Decreased tear production.   Headache.   Light-headedness, especially when you stand up from a sitting position.  Severe Dehydration  Changes in skin.   Cold and clammy skin.   Skin does not spring back quickly when lightly pinched and released.   Changes in body fluids.   Extreme thirst.   No tears.   Not able to sweat when body temperature is high, such as in hot weather.   Minimal urine production.   Changes in vital signs.   Rapid, weak pulse (more than 100 beats per minute when you are sitting still).   Rapid breathing.   Low blood pressure.   Other changes.   Sunken eyes.   Cold hands and feet.    Confusion.  Lethargy and difficulty being awakened.  Fainting (syncope).   Short-term weight loss.   Unconsciousness. DIAGNOSIS  This condition may be diagnosed based on your symptoms. You may also have tests to determine how severe your dehydration is. These tests may include:   Urine tests.   Blood tests.  TREATMENT  Treatment for this condition depends on the severity. Mild or moderate dehydration can often be treated at home. Treatment should be started right away. Do not wait until dehydration becomes severe. Severe dehydration needs to be treated at the hospital. Treatment for Mild Dehydration  Drinking plenty of water to replace the fluid you have lost.   Replacing minerals in your blood (electrolytes) that you may have lost.  Treatment for Moderate Dehydration  Consuming oral rehydration solution (ORS). Treatment for Severe Dehydration  Receiving fluid through an IV tube.   Receiving electrolyte solution through a feeding tube that is passed through your nose and into your stomach (nasogastric tube or NG tube).  Correcting any abnormalities in electrolytes. HOME CARE INSTRUCTIONS   Drink enough fluid to keep your urine clear or pale yellow.   Drink water or fluid slowly by taking small sips. You can also try sucking on ice cubes.  Have food or beverages that contain electrolytes. Examples include bananas and sports drinks.  Take over-the-counter and prescription medicines only as told by your health care provider.   Prepare ORS according to the manufacturer's instructions. Take sips   of ORS every 5 minutes until your urine returns to normal.  If you have vomiting or diarrhea, continue to try to drink water, ORS, or both.   If you have diarrhea, avoid:   Beverages that contain caffeine.   Fruit juice.   Milk.   Carbonated soft drinks.  Do not take salt tablets. This can lead to the condition of having too much sodium in your body  (hypernatremia).  SEEK MEDICAL CARE IF:  You cannot eat or drink without vomiting.  You have had moderate diarrhea during a period of more than 24 hours.  You have a fever. SEEK IMMEDIATE MEDICAL CARE IF:   You have extreme thirst.  You have severe diarrhea.  You have not urinated in 6-8 hours, or you have urinated only a small amount of very dark urine.  You have shriveled skin.  You are dizzy, confused, or both.   This information is not intended to replace advice given to you by your health care provider. Make sure you discuss any questions you have with your health care provider.   Document Released: 08/11/2005 Document Revised: 05/02/2015 Document Reviewed: 12/27/2014 Elsevier Interactive Patient Education 2016 Elsevier Inc.   Hypokalemia Hypokalemia means that the amount of potassium in the blood is lower than normal.Potassium is a chemical, called an electrolyte, that helps regulate the amount of fluid in the body. It also stimulates muscle contraction and helps nerves function properly.Most of the body's potassium is inside of cells, and only a very small amount is in the blood. Because the amount in the blood is so small, minor changes can be life-threatening. CAUSES  Antibiotics.  Diarrhea or vomiting.  Using laxatives too much, which can cause diarrhea.  Chronic kidney disease.  Water pills (diuretics).  Eating disorders (bulimia).  Low magnesium level.  Sweating a lot. SIGNS AND SYMPTOMS  Weakness.  Constipation.  Fatigue.  Muscle cramps.  Mental confusion.  Skipped heartbeats or irregular heartbeat (palpitations).  Tingling or numbness. DIAGNOSIS  Your health care provider can diagnose hypokalemia with blood tests. In addition to checking your potassium level, your health care provider may also check other lab tests. TREATMENT Hypokalemia can be treated with potassium supplements taken by mouth or adjustments in your current medicines.  If your potassium level is very low, you may need to get potassium through a vein (IV) and be monitored in the hospital. A diet high in potassium is also helpful. Foods high in potassium are:  Nuts, such as peanuts and pistachios.  Seeds, such as sunflower seeds and pumpkin seeds.  Peas, lentils, and lima beans.  Whole grain and bran cereals and breads.  Fresh fruit and vegetables, such as apricots, avocado, bananas, cantaloupe, kiwi, oranges, tomatoes, asparagus, and potatoes.  Orange and tomato juices.  Red meats.  Fruit yogurt. HOME CARE INSTRUCTIONS  Take all medicines as prescribed by your health care provider.  Maintain a healthy diet by including nutritious food, such as fruits, vegetables, nuts, whole grains, and lean meats.  If you are taking a laxative, be sure to follow the directions on the label. SEEK MEDICAL CARE IF:  Your weakness gets worse.  You feel your heart pounding or racing.  You are vomiting or having diarrhea.  You are diabetic and having trouble keeping your blood glucose in the normal range. SEEK IMMEDIATE MEDICAL CARE IF:  You have chest pain, shortness of breath, or dizziness.  You are vomiting or having diarrhea for more than 2 days.  You faint. MAKE   SURE YOU:   Understand these instructions.  Will watch your condition.  Will get help right away if you are not doing well or get worse.   This information is not intended to replace advice given to you by your health care provider. Make sure you discuss any questions you have with your health care provider.   Document Released: 08/11/2005 Document Revised: 09/01/2014 Document Reviewed: 02/11/2013 Elsevier Interactive Patient Education 2016 Elsevier Inc.  Famotidine injection What is this medicine? FAMOTIDINE (fa MOE ti deen) is a type of antihistamine that blocks the release of stomach acid. It is used to treat stomach or intestinal ulcers. It can relieve ulcer pain and discomfort,  and the heartburn from acid reflux. This medicine may be used for other purposes; ask your health care provider or pharmacist if you have questions. What should I tell my health care provider before I take this medicine? They need to know if you have any of these conditions: -kidney or liver disease -an unusual or allergic reaction to famotidine, other medicines, foods, dyes, or preservatives -pregnant or trying to get pregnant -breast-feeding How should I use this medicine? This medicine is for infusion into a vein. It is given by a health care professional in a hospital or clinic setting. Talk to your pediatrician regarding the use of this medicine in children. Special care may be needed. Overdosage: If you think you have taken too much of this medicine contact a poison control center or emergency room at once. NOTE: This medicine is only for you. Do not share this medicine with others. What if I miss a dose? This does not apply. What may interact with this medicine? -delavirdine -itraconazole -ketoconazole This list may not describe all possible interactions. Give your health care provider a list of all the medicines, herbs, non-prescription drugs, or dietary supplements you use. Also tell them if you smoke, drink alcohol, or use illegal drugs. Some items may interact with your medicine. What should I watch for while using this medicine? Tell your doctor or health care professional if your condition does not start to get better or gets worse. Do not take with aspirin, ibuprofen, or other antiinflammatory medicines. These can aggravate your condition. Do not smoke cigarettes or drink alcohol. These increase irritation in your stomach and can increase the time it will take for ulcers to heal. Cigarettes and alcohol can also worsen acid reflux or heartburn. If you get black, tarry stools or vomit up what looks like coffee grounds, call your doctor or health care professional at once. You may  have a bleeding ulcer. What side effects may I notice from receiving this medicine? Side effects that you should report to your doctor or health care professional as soon as possible: -allergic reactions like skin rash, itching or hives, swelling of the face, lips, or tongue -agitation, nervousness -confusion -hallucinations Side effects that usually do not require medical attention (report to your doctor or health care professional if they continue or are bothersome): -constipation -diarrhea -dizziness -headache This list may not describe all possible side effects. Call your doctor for medical advice about side effects. You may report side effects to FDA at 1-800-FDA-1088. Where should I keep my medicine? This medicine is given in a hospital or clinic. You will not be given this medicine to store at home. NOTE: This sheet is a summary. It may not cover all possible information. If you have questions about this medicine, talk to your doctor, pharmacist, or health care provider.  2016, Elsevier/Gold Standard. (2007-12-15 13:24:51)   Famotidine injection What is this medicine? FAMOTIDINE (fa MOE ti deen) is a type of antihistamine that blocks the release of stomach acid. It is used to treat stomach or intestinal ulcers. It can relieve ulcer pain and discomfort, and the heartburn from acid reflux. This medicine may be used for other purposes; ask your health care provider or pharmacist if you have questions. What should I tell my health care provider before I take this medicine? They need to know if you have any of these conditions: -kidney or liver disease -an unusual or allergic reaction to famotidine, other medicines, foods, dyes, or preservatives -pregnant or trying to get pregnant -breast-feeding How should I use this medicine? This medicine is for infusion into a vein. It is given by a health care professional in a hospital or clinic setting. Talk to your pediatrician regarding  the use of this medicine in children. Special care may be needed. Overdosage: If you think you have taken too much of this medicine contact a poison control center or emergency room at once. NOTE: This medicine is only for you. Do not share this medicine with others. What if I miss a dose? This does not apply. What may interact with this medicine? -delavirdine -itraconazole -ketoconazole This list may not describe all possible interactions. Give your health care provider a list of all the medicines, herbs, non-prescription drugs, or dietary supplements you use. Also tell them if you smoke, drink alcohol, or use illegal drugs. Some items may interact with your medicine. What should I watch for while using this medicine? Tell your doctor or health care professional if your condition does not start to get better or gets worse. Do not take with aspirin, ibuprofen, or other antiinflammatory medicines. These can aggravate your condition. Do not smoke cigarettes or drink alcohol. These increase irritation in your stomach and can increase the time it will take for ulcers to heal. Cigarettes and alcohol can also worsen acid reflux or heartburn. If you get black, tarry stools or vomit up what looks like coffee grounds, call your doctor or health care professional at once. You may have a bleeding ulcer. What side effects may I notice from receiving this medicine? Side effects that you should report to your doctor or health care professional as soon as possible: -allergic reactions like skin rash, itching or hives, swelling of the face, lips, or tongue -agitation, nervousness -confusion -hallucinations Side effects that usually do not require medical attention (report to your doctor or health care professional if they continue or are bothersome): -constipation -diarrhea -dizziness -headache This list may not describe all possible side effects. Call your doctor for medical advice about side effects. You  may report side effects to FDA at 1-800-FDA-1088. Where should I keep my medicine? This medicine is given in a hospital or clinic. You will not be given this medicine to store at home. NOTE: This sheet is a summary. It may not cover all possible information. If you have questions about this medicine, talk to your doctor, pharmacist, or health care provider.    2016, Elsevier/Gold Standard. (2007-12-15 13:24:51)

## 2015-09-14 ENCOUNTER — Other Ambulatory Visit (HOSPITAL_BASED_OUTPATIENT_CLINIC_OR_DEPARTMENT_OTHER): Payer: BLUE CROSS/BLUE SHIELD

## 2015-09-14 ENCOUNTER — Ambulatory Visit (HOSPITAL_BASED_OUTPATIENT_CLINIC_OR_DEPARTMENT_OTHER): Payer: BLUE CROSS/BLUE SHIELD

## 2015-09-14 ENCOUNTER — Telehealth: Payer: Self-pay | Admitting: *Deleted

## 2015-09-14 ENCOUNTER — Other Ambulatory Visit: Payer: Self-pay | Admitting: Family

## 2015-09-14 VITALS — BP 123/68 | HR 85 | Temp 97.9°F | Resp 18

## 2015-09-14 DIAGNOSIS — E860 Dehydration: Secondary | ICD-10-CM | POA: Diagnosis not present

## 2015-09-14 DIAGNOSIS — C799 Secondary malignant neoplasm of unspecified site: Secondary | ICD-10-CM

## 2015-09-14 DIAGNOSIS — C439 Malignant melanoma of skin, unspecified: Secondary | ICD-10-CM

## 2015-09-14 LAB — CBC WITH DIFFERENTIAL (CANCER CENTER ONLY)
BASO#: 0 10*3/uL (ref 0.0–0.2)
BASO%: 0.1 % (ref 0.0–2.0)
EOS%: 0.2 % (ref 0.0–7.0)
Eosinophils Absolute: 0.1 10*3/uL (ref 0.0–0.5)
HCT: 36.1 % (ref 34.8–46.6)
HGB: 11.3 g/dL — ABNORMAL LOW (ref 11.6–15.9)
LYMPH#: 5 10*3/uL — ABNORMAL HIGH (ref 0.9–3.3)
LYMPH%: 21 % (ref 14.0–48.0)
MCH: 25.4 pg — ABNORMAL LOW (ref 26.0–34.0)
MCHC: 31.3 g/dL — ABNORMAL LOW (ref 32.0–36.0)
MCV: 81 fL (ref 81–101)
MONO#: 1.8 10*3/uL — AB (ref 0.1–0.9)
MONO%: 7.8 % (ref 0.0–13.0)
NEUT#: 16.7 10*3/uL — ABNORMAL HIGH (ref 1.5–6.5)
NEUT%: 70.9 % (ref 39.6–80.0)
PLATELETS: 473 10*3/uL — AB (ref 145–400)
RBC: 4.45 10*6/uL (ref 3.70–5.32)
RDW: 15.2 % (ref 11.1–15.7)
WBC: 23.5 10*3/uL — AB (ref 3.9–10.0)

## 2015-09-14 LAB — CMP (CANCER CENTER ONLY)
ALK PHOS: 51 U/L (ref 26–84)
ALT: 14 U/L (ref 10–47)
AST: 21 U/L (ref 11–38)
Albumin: 2.5 g/dL — ABNORMAL LOW (ref 3.3–5.5)
BILIRUBIN TOTAL: 0.5 mg/dL (ref 0.20–1.60)
BUN: 7 mg/dL (ref 7–22)
CO2: 25 mEq/L (ref 18–33)
CREATININE: 0.6 mg/dL (ref 0.6–1.2)
Calcium: 8 mg/dL (ref 8.0–10.3)
Chloride: 100 mEq/L (ref 98–108)
Glucose, Bld: 101 mg/dL (ref 73–118)
POTASSIUM: 3 meq/L — AB (ref 3.3–4.7)
SODIUM: 133 meq/L (ref 128–145)
TOTAL PROTEIN: 5.9 g/dL — AB (ref 6.4–8.1)

## 2015-09-14 LAB — TECHNOLOGIST REVIEW CHCC SATELLITE

## 2015-09-14 MED ORDER — FAMOTIDINE IN NACL 20-0.9 MG/50ML-% IV SOLN
INTRAVENOUS | Status: AC
  Filled 2015-09-14: qty 100

## 2015-09-14 MED ORDER — METHYLPREDNISOLONE SODIUM SUCC 125 MG IJ SOLR
80.0000 mg | Freq: Once | INTRAMUSCULAR | Status: AC
  Administered 2015-09-14: 80 mg via INTRAVENOUS

## 2015-09-14 MED ORDER — SODIUM CHLORIDE 0.9 % IV SOLN
INTRAVENOUS | Status: AC
  Administered 2015-09-14: 09:00:00 via INTRAVENOUS

## 2015-09-14 MED ORDER — HEPARIN SOD (PORK) LOCK FLUSH 100 UNIT/ML IV SOLN
500.0000 [IU] | Freq: Once | INTRAVENOUS | Status: AC
  Administered 2015-09-14: 500 [IU] via INTRAVENOUS
  Filled 2015-09-14: qty 5

## 2015-09-14 MED ORDER — FAMOTIDINE IN NACL 20-0.9 MG/50ML-% IV SOLN
40.0000 mg | Freq: Once | INTRAVENOUS | Status: AC
  Administered 2015-09-14: 40 mg via INTRAVENOUS

## 2015-09-14 MED ORDER — METHYLPREDNISOLONE SODIUM SUCC 125 MG IJ SOLR
INTRAMUSCULAR | Status: AC
  Filled 2015-09-14: qty 2

## 2015-09-14 MED ORDER — SODIUM CHLORIDE 0.9 % IJ SOLN
10.0000 mL | INTRAMUSCULAR | Status: DC | PRN
  Administered 2015-09-14: 10 mL via INTRAVENOUS
  Filled 2015-09-14: qty 10

## 2015-09-14 NOTE — Telephone Encounter (Signed)
Critical Value Potassium 3.0 Dr Marin Olp notified. No orders received.

## 2015-09-14 NOTE — Patient Instructions (Signed)

## 2015-09-18 ENCOUNTER — Other Ambulatory Visit: Payer: Self-pay | Admitting: Family

## 2015-09-18 ENCOUNTER — Encounter: Payer: Self-pay | Admitting: Hematology & Oncology

## 2015-09-18 ENCOUNTER — Telehealth: Payer: Self-pay | Admitting: *Deleted

## 2015-09-18 ENCOUNTER — Other Ambulatory Visit: Payer: Self-pay

## 2015-09-18 ENCOUNTER — Ambulatory Visit (HOSPITAL_BASED_OUTPATIENT_CLINIC_OR_DEPARTMENT_OTHER): Payer: Self-pay | Admitting: Hematology & Oncology

## 2015-09-18 ENCOUNTER — Ambulatory Visit (HOSPITAL_BASED_OUTPATIENT_CLINIC_OR_DEPARTMENT_OTHER): Payer: BLUE CROSS/BLUE SHIELD

## 2015-09-18 ENCOUNTER — Other Ambulatory Visit (HOSPITAL_BASED_OUTPATIENT_CLINIC_OR_DEPARTMENT_OTHER): Payer: BLUE CROSS/BLUE SHIELD

## 2015-09-18 ENCOUNTER — Inpatient Hospital Stay (HOSPITAL_COMMUNITY): Payer: BLUE CROSS/BLUE SHIELD

## 2015-09-18 ENCOUNTER — Inpatient Hospital Stay (HOSPITAL_COMMUNITY)
Admission: AD | Admit: 2015-09-18 | Discharge: 2015-09-25 | DRG: 392 | Disposition: A | Discharge status: Home Health | Payer: BLUE CROSS/BLUE SHIELD | Source: Ambulatory Visit | Attending: Hematology & Oncology | Admitting: Hematology & Oncology

## 2015-09-18 ENCOUNTER — Encounter (HOSPITAL_COMMUNITY): Payer: Self-pay

## 2015-09-18 VITALS — BP 120/48 | HR 119 | Temp 97.6°F | Resp 14 | Ht 61.0 in | Wt 128.0 lb

## 2015-09-18 DIAGNOSIS — R197 Diarrhea, unspecified: Secondary | ICD-10-CM | POA: Diagnosis present

## 2015-09-18 DIAGNOSIS — E876 Hypokalemia: Secondary | ICD-10-CM | POA: Diagnosis present

## 2015-09-18 DIAGNOSIS — R1084 Generalized abdominal pain: Secondary | ICD-10-CM

## 2015-09-18 DIAGNOSIS — R1111 Vomiting without nausea: Secondary | ICD-10-CM

## 2015-09-18 DIAGNOSIS — R110 Nausea: Secondary | ICD-10-CM

## 2015-09-18 DIAGNOSIS — E860 Dehydration: Secondary | ICD-10-CM | POA: Diagnosis present

## 2015-09-18 DIAGNOSIS — C799 Secondary malignant neoplasm of unspecified site: Secondary | ICD-10-CM

## 2015-09-18 DIAGNOSIS — C439 Malignant melanoma of skin, unspecified: Secondary | ICD-10-CM

## 2015-09-18 DIAGNOSIS — I10 Essential (primary) hypertension: Secondary | ICD-10-CM | POA: Diagnosis present

## 2015-09-18 DIAGNOSIS — Z9049 Acquired absence of other specified parts of digestive tract: Secondary | ICD-10-CM

## 2015-09-18 DIAGNOSIS — K529 Noninfective gastroenteritis and colitis, unspecified: Secondary | ICD-10-CM

## 2015-09-18 DIAGNOSIS — Z888 Allergy status to other drugs, medicaments and biological substances status: Secondary | ICD-10-CM

## 2015-09-18 DIAGNOSIS — Z8249 Family history of ischemic heart disease and other diseases of the circulatory system: Secondary | ICD-10-CM | POA: Diagnosis not present

## 2015-09-18 DIAGNOSIS — Z8673 Personal history of transient ischemic attack (TIA), and cerebral infarction without residual deficits: Secondary | ICD-10-CM | POA: Diagnosis not present

## 2015-09-18 DIAGNOSIS — B379 Candidiasis, unspecified: Secondary | ICD-10-CM | POA: Diagnosis not present

## 2015-09-18 DIAGNOSIS — A09 Infectious gastroenteritis and colitis, unspecified: Secondary | ICD-10-CM

## 2015-09-18 DIAGNOSIS — Z809 Family history of malignant neoplasm, unspecified: Secondary | ICD-10-CM

## 2015-09-18 DIAGNOSIS — R112 Nausea with vomiting, unspecified: Secondary | ICD-10-CM | POA: Diagnosis not present

## 2015-09-18 DIAGNOSIS — B370 Candidal stomatitis: Secondary | ICD-10-CM | POA: Diagnosis present

## 2015-09-18 DIAGNOSIS — J439 Emphysema, unspecified: Secondary | ICD-10-CM | POA: Diagnosis present

## 2015-09-18 DIAGNOSIS — K52832 Lymphocytic colitis: Secondary | ICD-10-CM | POA: Diagnosis present

## 2015-09-18 DIAGNOSIS — F17210 Nicotine dependence, cigarettes, uncomplicated: Secondary | ICD-10-CM | POA: Diagnosis present

## 2015-09-18 DIAGNOSIS — K591 Functional diarrhea: Secondary | ICD-10-CM

## 2015-09-18 DIAGNOSIS — E785 Hyperlipidemia, unspecified: Secondary | ICD-10-CM | POA: Diagnosis present

## 2015-09-18 DIAGNOSIS — K909 Intestinal malabsorption, unspecified: Secondary | ICD-10-CM

## 2015-09-18 HISTORY — DX: Nausea with vomiting, unspecified: Z98.890

## 2015-09-18 HISTORY — DX: Other specified postprocedural states: R11.2

## 2015-09-18 LAB — CBC WITH DIFFERENTIAL (CANCER CENTER ONLY)
BASO#: 0.1 10*3/uL (ref 0.0–0.2)
BASO%: 0.2 % (ref 0.0–2.0)
EOS%: 0.9 % (ref 0.0–7.0)
Eosinophils Absolute: 0.2 10*3/uL (ref 0.0–0.5)
HEMATOCRIT: 36.1 % (ref 34.8–46.6)
HGB: 11.5 g/dL — ABNORMAL LOW (ref 11.6–15.9)
LYMPH#: 1.9 10*3/uL (ref 0.9–3.3)
LYMPH%: 7.6 % — ABNORMAL LOW (ref 14.0–48.0)
MCH: 25.2 pg — ABNORMAL LOW (ref 26.0–34.0)
MCHC: 31.9 g/dL — AB (ref 32.0–36.0)
MCV: 79 fL — ABNORMAL LOW (ref 81–101)
MONO#: 1.6 10*3/uL — ABNORMAL HIGH (ref 0.1–0.9)
MONO%: 6.5 % (ref 0.0–13.0)
NEUT#: 21.3 10*3/uL — ABNORMAL HIGH (ref 1.5–6.5)
NEUT%: 84.8 % — AB (ref 39.6–80.0)
Platelets: 347 10*3/uL (ref 145–400)
RBC: 4.56 10*6/uL (ref 3.70–5.32)
RDW: 16 % — AB (ref 11.1–15.7)
WBC: 25.1 10*3/uL — ABNORMAL HIGH (ref 3.9–10.0)

## 2015-09-18 LAB — CREATININE, SERUM
Creatinine, Ser: 0.55 mg/dL (ref 0.44–1.00)
GFR calc Af Amer: >60 mL/min (ref >60)
GFR calc non Af Amer: >60 mL/min (ref >60)

## 2015-09-18 LAB — CMP (CANCER CENTER ONLY)
ALBUMIN: 2.3 g/dL — AB (ref 3.3–5.5)
ALK PHOS: 99 U/L — AB (ref 26–84)
ALT(SGPT): 7 U/L — ABNORMAL LOW (ref 10–47)
AST: 14 U/L (ref 11–38)
BILIRUBIN TOTAL: 0.8 mg/dL (ref 0.20–1.60)
BUN, Bld: 10 mg/dL (ref 7–22)
CALCIUM: 8.4 mg/dL (ref 8.0–10.3)
CO2: 27 meq/L (ref 18–33)
Chloride: 94 mEq/L — ABNORMAL LOW (ref 98–108)
Creat: 0.6 mg/dl (ref 0.6–1.2)
Glucose, Bld: 120 mg/dL — ABNORMAL HIGH (ref 73–118)
Potassium: 2.7 mEq/L — CL (ref 3.3–4.7)
Sodium: 126 mEq/L — ABNORMAL LOW (ref 128–145)
Total Protein: 5.3 g/dL — ABNORMAL LOW (ref 6.4–8.1)

## 2015-09-18 LAB — CBC
HEMATOCRIT: 32.5 % — AB (ref 36.0–46.0)
Hemoglobin: 10.1 g/dL — ABNORMAL LOW (ref 12.0–15.0)
MCH: 25.3 pg — ABNORMAL LOW (ref 26.0–34.0)
MCHC: 31.1 g/dL (ref 30.0–36.0)
MCV: 81.5 fL (ref 78.0–100.0)
PLATELETS: 332 10*3/uL (ref 150–400)
RBC: 3.99 MIL/uL (ref 3.87–5.11)
RDW: 16.2 % — AB (ref 11.5–15.5)
WBC: 18.5 10*3/uL — AB (ref 4.0–10.5)

## 2015-09-18 LAB — LACTATE DEHYDROGENASE: LDH: 447 U/L — ABNORMAL HIGH (ref 125–245)

## 2015-09-18 MED ORDER — ENOXAPARIN SODIUM 40 MG/0.4ML ~~LOC~~ SOLN
40.0000 mg | Freq: Every day | SUBCUTANEOUS | Status: DC
  Administered 2015-09-18 – 2015-09-23 (×6): 40 mg via SUBCUTANEOUS
  Filled 2015-09-18 (×6): qty 0.4

## 2015-09-18 MED ORDER — MORPHINE SULFATE 15 MG PO TABS
15.0000 mg | ORAL_TABLET | Freq: Four times a day (QID) | ORAL | Status: DC | PRN
  Administered 2015-09-18 – 2015-09-25 (×4): 15 mg via ORAL
  Filled 2015-09-18 (×4): qty 1

## 2015-09-18 MED ORDER — METHYLPREDNISOLONE SODIUM SUCC 125 MG IJ SOLR
125.0000 mg | INTRAMUSCULAR | Status: DC
  Administered 2015-09-18 – 2015-09-20 (×3): 125 mg via INTRAVENOUS
  Filled 2015-09-18 (×3): qty 2

## 2015-09-18 MED ORDER — CHLORHEXIDINE GLUCONATE 0.12 % MT SOLN
15.0000 mL | Freq: Two times a day (BID) | OROMUCOSAL | Status: DC
  Administered 2015-09-18 – 2015-09-22 (×5): 15 mL via OROMUCOSAL
  Filled 2015-09-18 (×6): qty 15

## 2015-09-18 MED ORDER — HEPARIN SOD (PORK) LOCK FLUSH 100 UNIT/ML IV SOLN
500.0000 [IU] | Freq: Once | INTRAVENOUS | Status: AC
  Administered 2015-09-18: 500 [IU] via INTRAVENOUS
  Filled 2015-09-18: qty 5

## 2015-09-18 MED ORDER — METRONIDAZOLE 500 MG PO TABS
500.0000 mg | ORAL_TABLET | Freq: Three times a day (TID) | ORAL | Status: DC
  Administered 2015-09-18 – 2015-09-21 (×6): 500 mg via ORAL
  Filled 2015-09-18 (×6): qty 1

## 2015-09-18 MED ORDER — SODIUM CHLORIDE 0.9% FLUSH
10.0000 mL | INTRAVENOUS | Status: DC | PRN
  Administered 2015-09-18: 10 mL via INTRAVENOUS
  Filled 2015-09-18: qty 10

## 2015-09-18 MED ORDER — MORPHINE SULFATE ER 30 MG PO TBCR
30.0000 mg | EXTENDED_RELEASE_TABLET | Freq: Two times a day (BID) | ORAL | Status: DC
  Administered 2015-09-19 – 2015-09-25 (×13): 30 mg via ORAL
  Filled 2015-09-18 (×14): qty 1

## 2015-09-18 MED ORDER — SODIUM CHLORIDE 0.9 % IV SOLN
INTRAVENOUS | Status: DC | PRN
  Administered 2015-09-20 – 2015-09-24 (×7): via INTRAVENOUS
  Filled 2015-09-18 (×15): qty 2

## 2015-09-18 MED ORDER — PANTOPRAZOLE SODIUM 40 MG IV SOLR
40.0000 mg | Freq: Two times a day (BID) | INTRAVENOUS | Status: DC
  Administered 2015-09-18 – 2015-09-19 (×3): 40 mg via INTRAVENOUS
  Filled 2015-09-18 (×4): qty 40

## 2015-09-18 MED ORDER — LOPERAMIDE HCL 2 MG PO CAPS
4.0000 mg | ORAL_CAPSULE | ORAL | Status: DC | PRN
  Administered 2015-09-19 – 2015-09-20 (×4): 4 mg via ORAL
  Filled 2015-09-18 (×4): qty 2

## 2015-09-18 MED ORDER — LOPERAMIDE HCL 2 MG PO TABS: 4.0000 mg | ORAL_TABLET | ORAL | Status: DC | PRN

## 2015-09-18 MED ORDER — CETYLPYRIDINIUM CHLORIDE 0.05 % MT LIQD
7.0000 mL | Freq: Two times a day (BID) | OROMUCOSAL | Status: DC
  Administered 2015-09-18 – 2015-09-19 (×3): 7 mL via OROMUCOSAL

## 2015-09-18 MED ORDER — POTASSIUM CHLORIDE IN NACL 40-0.9 MEQ/L-% IV SOLN
INTRAVENOUS | Status: AC
  Administered 2015-09-18 – 2015-09-19 (×3): 100 mL/h via INTRAVENOUS
  Filled 2015-09-18 (×3): qty 1000

## 2015-09-18 MED ORDER — DRONABINOL 2.5 MG PO CAPS
2.5000 mg | ORAL_CAPSULE | Freq: Two times a day (BID) | ORAL | Status: DC
  Administered 2015-09-18 – 2015-09-25 (×6): 2.5 mg via ORAL
  Filled 2015-09-18 (×6): qty 1

## 2015-09-18 MED ORDER — SODIUM CHLORIDE 0.9 % IV SOLN
Freq: Once | INTRAVENOUS | Status: AC
  Administered 2015-09-18: 13:00:00 via INTRAVENOUS

## 2015-09-18 MED ORDER — MORPHINE SULFATE 2 MG/ML IJ SOLN: 2.0000 mg | INTRAMUSCULAR | Status: DC | PRN

## 2015-09-18 MED ORDER — METHIMAZOLE 5 MG PO TABS
5.0000 mg | ORAL_TABLET | Freq: Every day | ORAL | Status: DC
  Administered 2015-09-19 – 2015-09-25 (×7): 5 mg via ORAL
  Filled 2015-09-18 (×7): qty 1

## 2015-09-18 MED ORDER — IOHEXOL 300 MG/ML  SOLN
75.0000 mL | Freq: Once | INTRAMUSCULAR | Status: AC | PRN
  Administered 2015-09-18: 75 mL via INTRAVENOUS

## 2015-09-18 MED ORDER — LORAZEPAM 0.5 MG PO TABS: 0.5000 mg | ORAL_TABLET | Freq: Four times a day (QID) | ORAL | Status: DC | PRN

## 2015-09-18 NOTE — Progress Notes (Signed)
Ms. Dierdre Harness has recurrence of the diarrhea and vomiting. As such, we're going to have to admit her. She will need a colonoscopy and possibly upper endoscopy. She will need IV fluids. I will also check a CT of the brain measure that she does not have brain metastasis.  I will also check her for C. Difficile.  Her husband and sister came in. They appreciate the fact that we will admit her to try to get her to feel better.  We cannot treat her today.  Laurey Arrow e

## 2015-09-18 NOTE — Progress Notes (Signed)
EKG done by NT, Mariann Laster.I called result to answering service,I spoke to Elly Modena RN and she will fax results to Dr. Dicie Beam office.

## 2015-09-18 NOTE — Patient Instructions (Signed)

## 2015-09-18 NOTE — Progress Notes (Signed)
Patient  Arrived to unit at this time. Alert and oriented x4. Pleasant. Appears weak. Speech is clear. Placed comfortably in bed. Placed on enteric precaution per order.  Will continue to monitor.

## 2015-09-18 NOTE — Telephone Encounter (Signed)
Critical Value Potassium 2.7 Dr Ennever notified. No orders at this time 

## 2015-09-18 NOTE — Progress Notes (Signed)
Burman Freestone PA aware of patient's K level of 2.7, no orders at this time.

## 2015-09-19 DIAGNOSIS — R197 Diarrhea, unspecified: Secondary | ICD-10-CM

## 2015-09-19 DIAGNOSIS — R1111 Vomiting without nausea: Secondary | ICD-10-CM

## 2015-09-19 DIAGNOSIS — E876 Hypokalemia: Secondary | ICD-10-CM

## 2015-09-19 DIAGNOSIS — C439 Malignant melanoma of skin, unspecified: Secondary | ICD-10-CM

## 2015-09-19 DIAGNOSIS — E860 Dehydration: Secondary | ICD-10-CM

## 2015-09-19 LAB — GLUCOSE, CAPILLARY
GLUCOSE-CAPILLARY: 112 mg/dL — AB (ref 65–99)
GLUCOSE-CAPILLARY: 87 mg/dL (ref 65–99)

## 2015-09-19 LAB — COMPREHENSIVE METABOLIC PANEL
ALT: 9 U/L — ABNORMAL LOW (ref 14–54)
ANION GAP: 9 (ref 5–15)
AST: 11 U/L — AB (ref 15–41)
Albumin: 2.1 g/dL — ABNORMAL LOW (ref 3.5–5.0)
Alkaline Phosphatase: 85 U/L (ref 38–126)
BUN: 7 mg/dL (ref 6–20)
CHLORIDE: 103 mmol/L (ref 101–111)
CO2: 22 mmol/L (ref 22–32)
Calcium: 7.2 mg/dL — ABNORMAL LOW (ref 8.9–10.3)
Creatinine, Ser: 0.48 mg/dL (ref 0.44–1.00)
GFR calc Af Amer: >60 mL/min (ref >60)
Glucose, Bld: 130 mg/dL — ABNORMAL HIGH (ref 65–99)
POTASSIUM: 3.5 mmol/L (ref 3.5–5.1)
Sodium: 134 mmol/L — ABNORMAL LOW (ref 135–145)
TOTAL PROTEIN: 5.1 g/dL — AB (ref 6.5–8.1)
Total Bilirubin: 0.5 mg/dL (ref 0.3–1.2)

## 2015-09-19 LAB — C DIFFICILE QUICK SCREEN W PCR REFLEX
C DIFFICILE (CDIFF) INTERP: NEGATIVE
C DIFFICILE (CDIFF) TOXIN: NEGATIVE
C Diff antigen: NEGATIVE

## 2015-09-19 LAB — CBC
HCT: 31.4 % — ABNORMAL LOW (ref 36.0–46.0)
Hemoglobin: 9.7 g/dL — ABNORMAL LOW (ref 12.0–15.0)
MCH: 25.3 pg — ABNORMAL LOW (ref 26.0–34.0)
MCHC: 30.9 g/dL (ref 30.0–36.0)
MCV: 81.8 fL (ref 78.0–100.0)
PLATELETS: 317 10*3/uL (ref 150–400)
RBC: 3.84 MIL/uL — ABNORMAL LOW (ref 3.87–5.11)
RDW: 16.3 % — AB (ref 11.5–15.5)
WBC: 14.9 10*3/uL — AB (ref 4.0–10.5)

## 2015-09-19 LAB — PHOSPHORUS: PHOSPHORUS: 2.8 mg/dL (ref 2.5–4.6)

## 2015-09-19 LAB — MAGNESIUM: MAGNESIUM: 1.6 mg/dL — AB (ref 1.7–2.4)

## 2015-09-19 LAB — OCCULT BLOOD X 1 CARD TO LAB, STOOL: FECAL OCCULT BLD: NEGATIVE

## 2015-09-19 LAB — TSH: TSH: 1.342 u[IU]/mL (ref 0.350–4.500)

## 2015-09-19 MED ORDER — POTASSIUM CHLORIDE IN NACL 40-0.9 MEQ/L-% IV SOLN
INTRAVENOUS | Status: AC
Start: 2015-09-19 — End: 2015-09-20
  Filled 2015-09-19 (×2): qty 1000

## 2015-09-19 MED ORDER — INSULIN ASPART 100 UNIT/ML ~~LOC~~ SOLN
0.0000 [IU] | Freq: Three times a day (TID) | SUBCUTANEOUS | Status: DC
  Administered 2015-09-20: 2 [IU] via SUBCUTANEOUS

## 2015-09-19 MED ORDER — MAGNESIUM SULFATE IN D5W 10-5 MG/ML-% IV SOLN
1.0000 g | Freq: Once | INTRAVENOUS | Status: AC
  Administered 2015-09-19: 1 g via INTRAVENOUS
  Filled 2015-09-19: qty 100

## 2015-09-19 MED ORDER — ADULT MULTIVITAMIN W/MINERALS CH
1.0000 | ORAL_TABLET | Freq: Every day | ORAL | Status: DC
  Administered 2015-09-19 – 2015-09-25 (×7): 1 via ORAL
  Filled 2015-09-19 (×7): qty 1

## 2015-09-19 MED ORDER — BOOST / RESOURCE BREEZE PO LIQD
1.0000 | Freq: Three times a day (TID) | ORAL | Status: DC
  Administered 2015-09-19 – 2015-09-20 (×3): 1 via ORAL

## 2015-09-19 MED ORDER — FAT EMULSION 20 % IV EMUL
120.0000 mL | INTRAVENOUS | Status: AC
  Administered 2015-09-19: 120 mL via INTRAVENOUS
  Filled 2015-09-19: qty 200

## 2015-09-19 MED ORDER — CLINIMIX E/DEXTROSE (5/20) 5 % IV SOLN
INTRAVENOUS | Status: AC
  Administered 2015-09-19: 19:00:00 via INTRAVENOUS
  Filled 2015-09-19: qty 720

## 2015-09-19 NOTE — H&P (Signed)
Referral MD  Reason for Referral: Diarrhea and vomiting. Hypokalemia. Dehydration.   No chief complaint on file. : I've been throwing up and having diarrhea for 2 days.  HPI: Ms. Ludwigsen is a very nice 55 year old white female. She has metastatic melanoma. We have her on Nivolumab with [facility]. She's had one cycle to date. She's had issues with diarrhea and nausea and vomiting for the past week. We've been trying to treat her in the office. She was better in the office with IV fluids. We gave her anti-medics. Gave her steroids. However, over the weekend, the diarrhea started. She is having 15 episodes a day. She's been having emesis. She came to the office for her second cycle of treatment on Tuesday. She was clearly in no shape to take treatment. She was dehydrated. She just did not look well.  Because of this, I thought she had to be admitted for evaluation.  I don't know she has C. difficile. I don't know if the diarrhea is immune mediated colitis. I think she probably needs to have an lower endoscopy. Per she's had no fever. Per she's had some pain.  She's had no cough. She's had no rashes. She's had no leg swelling.  She's tried everything possible to get better. She's had a great support system with her family. She just cannot manage as an outpatient.  Her faith remains strong. I just feel bad that we have not been able to keep her as an outpatient. However, I think that we have to admit her because of the dehydration. Her potassium the office was 2.7. She clearly needs IV fluids with potassium.  She's had no bleeding.  Overall, her performance status is ECOG 2.   Past Medical History  Diagnosis Date  . Thyroid disease   . Cancer ([place]) 08/25/14    metastatic melanoma; stage IV  . Emphysema of lung ([place])   . Glaucoma   . Stroke ([place])   . Meniere's disease   . Splenic infarct   . Hyperlipidemia 04/26/2015  . Hypertension   . PONV (postoperative nausea and vomiting)   :  Past  Surgical History  Procedure Laterality Date  . Cholecystectomy  1992  :   Current facility-administered medications:  .  0.9 % NaCl with KCl 40 mEq / L  infusion, , Intravenous, Continuous, Volanda Napoleon, MD, Last Rate: 100 mL/hr at 09/19/15 0113, 100 mL/hr at 09/19/15 0113 .  antiseptic oral rinse (CPC / CETYLPYRIDINIUM CHLORIDE 0.05%) solution 7 mL, 7 mL, Mouth Rinse, q12n4p, Volanda Napoleon, MD, 7 mL at 09/18/15 1615 .  chlorhexidine (PERIDEX) 0.12 % solution 15 mL, 15 mL, Mouth Rinse, BID, Volanda Napoleon, MD, 15 mL at 09/18/15 2224 .  dronabinol (MARINOL) capsule 2.5 mg, 2.5 mg, Oral, BID AC, Volanda Napoleon, MD, 2.5 mg at 09/18/15 1824 .  enoxaparin (LOVENOX) injection 40 mg, 40 mg, Subcutaneous, Daily, Volanda Napoleon, MD, 40 mg at 09/18/15 1558 .  loperamide (IMODIUM) capsule 4 mg, 4 mg, Oral, PRN, Volanda Napoleon, MD .  LORazepam (ATIVAN) tablet 0.5 mg, 0.5 mg, Oral, Q6H PRN, Volanda Napoleon, MD .  methimazole (TAPAZOLE) tablet 5 mg, 5 mg, Oral, Daily, Volanda Napoleon, MD .  methylPREDNISolone sodium succinate (SOLU-MEDROL) 125 mg/2 mL injection 125 mg, 125 mg, Intravenous, Q24H, Volanda Napoleon, MD, 125 mg at 09/18/15 1824 .  metroNIDAZOLE (FLAGYL) tablet 500 mg, 500 mg, Oral, 3 times per day, Volanda Napoleon, MD, 500 mg at 09/18/15  1824 .  morphine (MS CONTIN) 12 hr tablet 30 mg, 30 mg, Oral, Q12H, Volanda Napoleon, MD, 30 mg at 09/18/15 2224 .  morphine (MSIR) tablet 15 mg, 15 mg, Oral, Q6H PRN, Volanda Napoleon, MD, 15 mg at 09/18/15 1824 .  morphine 2 MG/ML injection 2 mg, 2 mg, Intravenous, Q2H PRN, Holli Humbles [place], NP .  ondansetron [facility]) 4 mg in sodium chloride 0.9 % 50 mL IVPB, , Intravenous, Q4H PRN, Holli Humbles [place], NP .  pantoprazole (PROTONIX) injection 40 mg, 40 mg, Intravenous, Q12H, Volanda Napoleon, MD, 40 mg at 09/18/15 2224:  . antiseptic oral rinse  7 mL Mouth Rinse q12n4p  . chlorhexidine  15 mL Mouth Rinse BID  . dronabinol  2.5 mg Oral BID AC  .  enoxaparin (LOVENOX) injection  40 mg Subcutaneous Daily  . methimazole  5 mg Oral Daily  . methylPREDNISolone (SOLU-MEDROL) injection  125 mg Intravenous Q24H  . metroNIDAZOLE  500 mg Oral 3 times per day  . morphine  30 mg Oral Q12H  . pantoprazole (PROTONIX) IV  40 mg Intravenous Q12H  :  Allergies  Allergen Reactions  . Cotellic [Cobimetinib] Diarrhea  . Oxycodone Nausea And Vomiting  . Pseudoephedrine Other (See Comments)    Makes patient feel weird  . Scopolamine Other (See Comments)  . Tramadol     Other reaction(s): GI Upset (intolerance)  :  Family History  Problem Relation Age of Onset  . Thyroid disease Mother   . Thyroid disease Sister   . Cancer Sister   . Heart attack Brother   :  Social History   Social History  . Marital Status: Married    Spouse Name: N/A  . Number of Children: N/A  . Years of Education: N/A   Occupational History  . Not on file.   Social History Main Topics  . Smoking status: Current Every Day Smoker -- 1.00 packs/day for 36 years    Types: Cigarettes  . Smokeless tobacco: Never Used  . Alcohol Use: No  . Drug Use: No  . Sexual Activity: Yes    Birth Control/ Protection: None   Other Topics Concern  . 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  Exam: Patient Vitals for the past 24 hrs:  BP Temp Temp src Pulse Resp SpO2  09/19/15 0610 (!) 115/56 mmHg 97.3 F (36.3 C) Oral 80 16 96 %  09/18/15 2355 (!) 107/50 mmHg 99 F (37.2 C) Oral 87 16 95 %  09/18/15 1620 (!) 117/52 mmHg 98 F (36.7 C) Oral 91 16 96 %    chronically ill-appearing white female. She looks well. Her headache exam shows dry oral mucosa. She has no scleral icterus. She has no adenopathy in the neck. Lungs are with some slight decrease  in the bases. Cardiac exam regular rate and rhythm with no murmurs, rubs or bruits. Abdomen is soft. Abdomen my be slightly distended. Bowel sounds are hyperactive. My be some slight tenderness in the periumbilical region. No obvious abdominal masses noted. There is no palpable liver or spleen. Extremities shows no clubbing, cyanosis or edema. Skin exam shows no ecchymosis or petechia.    Recent Labs  09/18/15 1805 09/19/15 [id number]  WBC 18.5* 14.9*  HGB 10.1* 9.7*  HCT 32.5* 31.4*  PLT 332 317    Recent Labs  09/18/15 1147 09/18/15 1805 09/19/15 0625  NA 126*  --  134*  K 2.7*  --  3.5  CL 94*  --  103  CO2 27  --  22  GLUCOSE 120*  --  130*  BUN 10  --  7  CREATININE 0.6 0.55 0.48  CALCIUM 8.4  --  7.2*    Blood smear review:  None  Pathology: None     Assessment and Plan:  Ms. Yew is a very nice 55 year old white female no second melanoma. She has diarrhea. Again, I'm not sure the diarrhea is infectious or immune mediated. I'm sending off a C. difficile toxin on her. I think that she needs a colonoscopy. She may need an upper endoscopy because of the vomiting.  We did go ahead and get a CT of the brain. This did not show any obvious brain metastasis.  We got an abdominal series on her. There is no obstruction or ileus.  She may need to have some parenteral nutrition. I think it may be a few days before she will be able to eat. She has a Port-A-Cath in. I will order TNA on her. I appreciate, so much, pharmacy being able to help [place] with this.  We will check her potassium. I'll give her IV fluids with potassium.  Again, she is no candidate for further therapy right now until we sort out what is causing this diarrhea.   I will give her some steroids.  I'm sure that the staff on [address] will do a tremendous job in getting her better so she can feel better.  Pete E.

## 2015-09-19 NOTE — Progress Notes (Signed)
Initial Nutrition Assessment  DOCUMENTATION CODES:   Not applicable  INTERVENTION:  Please monitor Mg, K, and Phos for at least 3 days. MD to replete as needed. Pt at risk for refeeding syndrome due to  to significant weight loss, inability to tolerate PO intake, and low Mg levels.  -Provide Boost Breeze TID 250 kcal, 9 grams of Protein -TPN per pharmacy  -Continue to monitor for nutritional needs and diet advancement   NUTRITION DIAGNOSIS:   Unintentional weight loss related to other (see comment), nausea, vomiting (diahhrea ) as evidenced by percent weight loss.  GOAL:   Patient will meet greater than or equal to 90% of their needs  MONITOR:   PO intake, Supplement acceptance, Diet advancement, Labs, Weight trends, I & O's, Other (Comment) (TPN)  REASON FOR ASSESSMENT:   Consult New TPN/TNA  ASSESSMENT:   55 year old white female. She has metastatic melanoma. We have her on Nivolumab with [facility]. She's had one cycle to date. She's had issues with diarrhea and nausea and vomiting for the past week. We've been trying to treat her in the office. She was better in the office with IV fluids. We gave her anti-medics. Gave her steroids. However, over the weekend, the diarrhea started. She is having 15 episodes a day. She's been having emesis. She came to the office for her second cycle of treatment on Tuesday. She was clearly in no shape to take treatment. She was dehydrated. She just did not look well.  Pt seen for TPN consult. Pt reports good appetite and feeling hungry. Pt ready for diet advancement. Pt consumed ~100% of tray, currently on clear liquid diet. Pt denies nausea and vomiting today. Pt continues to have diarrhea.  RD spoke with pharmacy and MD regarding TPN. Per MD ordering TPN for bowel rest.  Pt reports usual body weight of 200 lb, this was a year ago. Pt has a 36% weight loss in a year,this is significant for time frame. Pt has a gained 5 lb in a week due to steroid  medications.   Conducted a NFPE, found no signs of muscle or fat wasting.   Medications reviewed. Labs reviewed; Ca 7.2, Mg 1.6   Pt is at risk for refeeding syndrome due to significant weight loss, inability to tolerate PO intake, and low Mg levels.  Diet Order:  Diet clear liquid Room service appropriate?: Yes; Fluid consistency:: Thin  Skin:  Reviewed, no issues  Last BM:  1/25  Height:   Ht Readings from Last 1 Encounters:  09/18/15 5\' 1"  (1.549 m)    Weight:   Wt Readings from Last 1 Encounters:  09/18/15 128 lb (58.06 kg)    Ideal Body Weight:  48 kg  BMI:  There is no weight on file to calculate BMI.  Estimated Nutritional Needs:   Kcal:  [id number]  Protein:  90-100 grams  Fluid:  1.7-1.9 L  EDUCATION NEEDS:   No education needs identified at this time  [place], Dietetic Intern Pager: [phone]

## 2015-09-19 NOTE — Progress Notes (Addendum)
PARENTERAL NUTRITION CONSULT NOTE - INITIAL  Pharmacy Consult for TPN Indication: Possible Colitis  Allergies  Allergen Reactions  . Cotellic [Cobimetinib] Diarrhea  . Oxycodone Nausea And Vomiting  . Pseudoephedrine Other (See Comments)    Makes patient feel weird  . Scopolamine Other (See Comments)  . Tramadol     Other reaction(s): GI Upset (intolerance)    Patient Measurements:   Adjusted Body Weight:  Usual Weight:   Vital Signs: Temp: 97.3 F (36.3 C) (01/25 0610) Temp Source: Oral (01/25 0610) BP: 115/56 mmHg (01/25 0610) Pulse Rate: 80 (01/25 0610) Intake/Output from previous day: 01/24 0701 - 01/25 0700 In: 1703.3 [P.O.:300; I.V.:1403.3] Out: 200 [Urine:200] Intake/Output from this shift:    Labs:  Recent Labs  09/18/15 1146 09/18/15 1805 09/19/15 0625  WBC 25.1* 18.5* 14.9*  HGB 11.5* 10.1* 9.7*  HCT 36.1 32.5* 31.4*  PLT 347 Large platelets present 332 317     Recent Labs  09/18/15 1147 09/18/15 1805 09/19/15 0625  NA 126*  --  134*  K 2.7*  --  3.5  CL 94*  --  103  CO2 27  --  22  GLUCOSE 120*  --  130*  BUN 10  --  7  CREATININE 0.6 0.55 0.48  CALCIUM 8.4  --  7.2*  PROT 5.3*  --  5.1*  ALBUMIN 2.3*  --  2.1*  AST 14  --  11*  ALT 7*  --  9*  ALKPHOS 99*  --  85  BILITOT 0.80  --  0.5   Estimated Creatinine Clearance: 65.1 mL/min (by C-G formula based on Cr of 0.48).   No results for input(s): GLUCAP in the last 72 hours.  Medical History: Past Medical History  Diagnosis Date  . Thyroid disease   . Cancer ([place]) 08/25/14    metastatic melanoma; stage IV  . Emphysema of lung ([place])   . Glaucoma   . Stroke ([place])   . Meniere's disease   . Splenic infarct   . Hyperlipidemia 04/26/2015  . Hypertension   . PONV (postoperative nausea and vomiting)     Insulin Requirements in the past 24 hours:  0 units / 24 hours  Current Nutrition:  CLD, ate 100% breakfast tray  IVF:  NS with KCl 40 mEq/L @ 100 ml/hr  Central access:  Implanted Port Right Chest 06/26/15 TPN start date: 09/19/15  ASSESSMENT                                                                                      HPI: 14 yoF with metastatic melanoma undergoing immunotherapy presents with nausea, vomiting, and diarrhea for several weeks.  KUB and abdominal series negative for obstruction and ileus.  Pt receiving steroids, empiric CDiff treatment, and IVFs. Pharmacy consulted to start TPN for possible colitis.     Significant events:  1/25: Pt reports good appetite and feeling hungry, ready for diet advancement. Denies N/V, continues to have diarrhea. RD discussed enteral/PO diet advancement options with oncologist however per Dr. Marin Olp, TPN is preferred.   Today:   Glucose - No hx diabetes.  CBGs at goal (< 150).  On IV  steroids q24h  Electrolytes - Na and K both improved, Corr Ca WNL, Mg slightly low 1.6.   Renal - Stable  LFTs -Low  TGs - 180 on 04/24/15 (goal < 150)  Prealbumin - 8 on 09/11/15 (goal >   NUTRITIONAL GOALS                                                                                   PharmD recs: 1750-2000 Kcal/day, 70-85 g/day protein RD recs: pending  Clinimix E5/20 at a goal rate of 58ml/hr + 20% fat emulsion at 58ml/hr to provide: 78g/day protein, 1853Kcal/day.  PLAN                                                                                                               At 1800 today:  Start Clinimix E5/20 at 66ml/hr.  20% fat emulsion at 13ml/hr.  Plan to advance as tolerated to the goal rate.  PO multivitamin with trace elements as patient tolerating oral medications  Magnesium 1g IV x 1  Reduce IVF to 55ml/hr.  Add sensitive SSI ACHS.   TPN lab panels on Mondays & Thursdays.  Follow up diet advancements / need for TPN  Monitor closely for re-feeding syndrome. Phos, Mag included in TPN labs for 1/26.   Follow daily  Ralene Bathe, PharmD, BCPS 09/19/2015, 10:32 AM  Pager:  [phone]

## 2015-09-19 NOTE — Consult Note (Signed)
[facility] Consultation Note  Referring Provider: Dr. Lattie Haw Primary Care Physician:  Nance Pear., NP  Reason for Consultation:  Diarrhea   HPI: Felicia Richmond is a 55 y.o. female with history of metastatic melanoma on chemotherapy.  She has 5+ year history of diarrhea, which has not previously been evaluated, but which became progressively worse since starting chemotherapy.  She now describes generalized crampy abdominal pain without clear alleviating or exacerbating factors.  She also has diarrhea, 10+ episodes daily, with urgency, nocturnal stools and frequent episodes of incontinence.  No blood in stool.  Has lost 10 lbs in the past couple weeks.  No prior colonoscopy.   Past Medical History  Diagnosis Date  . Thyroid disease   . Cancer ([place]) 08/25/14    metastatic melanoma; stage IV  . Emphysema of lung ([place])   . Glaucoma   . Stroke ([place])   . Meniere's disease   . Splenic infarct   . Hyperlipidemia 04/26/2015  . Hypertension   . PONV (postoperative nausea and vomiting)     Past Surgical History  Procedure Laterality Date  . Cholecystectomy  1992    Prior to Admission medications   Medication Sig Start Date End Date Taking? Authorizing Provider  acetaminophen (TYLENOL) 500 MG tablet Take 1,000 mg by mouth every 6 (six) hours as needed for moderate pain or headache.   Yes Historical Provider, MD  atorvastatin (LIPITOR) 40 MG tablet Take 1 tablet (40 mg total) by mouth daily. 04/27/15  Yes Debbrah Alar, NP  dronabinol (MARINOL) 5 MG capsule Take 1 capsule (5 mg total) by mouth 2 (two) times daily before a meal. 06/15/15  Yes Volanda Napoleon, MD  famotidine (PEPCID) 20 MG tablet Take 20 mg by mouth daily.   Yes Historical Provider, MD  lidocaine-prilocaine (EMLA) cream Apply to affected area once 08/21/15  Yes Volanda Napoleon, MD  loperamide (IMODIUM A-D) 2 MG tablet Take 4 mg by mouth as needed for diarrhea or loose stools.   Yes Historical Provider, MD   LORazepam (ATIVAN) 0.5 MG tablet Take 1 tablet (0.5 mg total) by mouth every 6 (six) hours as needed (Nausea or vomiting). 09/03/15  Yes Volanda Napoleon, MD  Meclizine HCl 25 MG CHEW Chew 1 tablet (25 mg total) by mouth daily as needed (dizziness). 08/14/15  Yes Eliezer Bottom, NP  methimazole (TAPAZOLE) 5 MG tablet Take 5 mg by mouth daily.  09/01/14  Yes Historical Provider, MD  morphine (MS CONTIN) 30 MG 12 hr tablet Take 1 tablet (30 mg total) by mouth every 12 (twelve) hours. 09/12/15  Yes Volanda Napoleon, MD  morphine (MSIR) 15 MG tablet Take 1 tablet (15 mg total) by mouth every 6 (six) hours as needed for severe pain. 09/12/15  Yes Volanda Napoleon, MD  naloxegol oxalate (MOVANTIK) 25 MG TABS tablet Take 1 tablet (25 mg total) by mouth daily. 08/14/15  Yes Volanda Napoleon, MD  potassium chloride (K-DUR) 10 MEQ tablet Take 1 tablet (10 mEq total) by mouth daily. 06/04/15  Yes Debbrah Alar, NP  OLANZapine (ZYPREXA) 5 MG tablet Take 1 tablet (5 mg total) by mouth 2 (two) times daily. 09/03/15   Volanda Napoleon, MD    Current Facility-Administered Medications  Medication Dose Route Frequency Provider Last Rate Last Dose  . 0.9 % NaCl with KCl 40 mEq / L  infusion   Intravenous Continuous Volanda Napoleon, MD 100 mL/hr at 09/19/15 1049 100 mL/hr at 09/19/15 1049  .  0.9 % NaCl with KCl 40 mEq / L  infusion   Intravenous Continuous Volanda Napoleon, MD      . antiseptic oral rinse (CPC / CETYLPYRIDINIUM CHLORIDE 0.05%) solution 7 mL  7 mL Mouth Rinse q12n4p Volanda Napoleon, MD   7 mL at 09/18/15 1615  . chlorhexidine (PERIDEX) 0.12 % solution 15 mL  15 mL Mouth Rinse BID Volanda Napoleon, MD   15 mL at 09/19/15 1013  . dronabinol (MARINOL) capsule 2.5 mg  2.5 mg Oral BID AC Volanda Napoleon, MD   2.5 mg at 09/19/15 0806  . enoxaparin (LOVENOX) injection 40 mg  40 mg Subcutaneous Daily Volanda Napoleon, MD   40 mg at 09/19/15 1013  . TPN (CLINIMIX-E) Adult   Intravenous Continuous TPN Volanda Napoleon, MD       And  . fat emulsion 20 % infusion 120 mL  120 mL Intravenous Continuous TPN Volanda Napoleon, MD      . feeding supplement (BOOST / RESOURCE BREEZE) liquid 1 Container  1 Container Oral TID BM Clayton Bibles, RD   1 Container at 09/19/15 1049  . insulin aspart (novoLOG) injection 0-9 Units  0-9 Units Subcutaneous TID WC Volanda Napoleon, MD   0 Units at 09/19/15 1200  . loperamide (IMODIUM) capsule 4 mg  4 mg Oral PRN Volanda Napoleon, MD   4 mg at 09/19/15 1013  . LORazepam (ATIVAN) tablet 0.5 mg  0.5 mg Oral Q6H PRN Volanda Napoleon, MD      . magnesium sulfate IVPB 1 g 100 mL  1 g Intravenous Once Volanda Napoleon, MD      . methimazole (TAPAZOLE) tablet 5 mg  5 mg Oral Daily Volanda Napoleon, MD   5 mg at 09/19/15 1013  . methylPREDNISolone sodium succinate (SOLU-MEDROL) 125 mg/2 mL injection 125 mg  125 mg Intravenous Q24H Volanda Napoleon, MD   125 mg at 09/18/15 1824  . metroNIDAZOLE (FLAGYL) tablet 500 mg  500 mg Oral 3 times per day Volanda Napoleon, MD   500 mg at 09/18/15 1824  . morphine (MS CONTIN) 12 hr tablet 30 mg  30 mg Oral Q12H Volanda Napoleon, MD   30 mg at 09/19/15 1013  . morphine (MSIR) tablet 15 mg  15 mg Oral Q6H PRN Volanda Napoleon, MD   15 mg at 09/18/15 1824  . morphine 2 MG/ML injection 2 mg  2 mg Intravenous Q2H PRN Eliezer Bottom, NP      . multivitamin with minerals tablet 1 tablet  1 tablet Oral Daily Volanda Napoleon, MD      . ondansetron [facility]) 4 mg in sodium chloride 0.9 % 50 mL IVPB   Intravenous Q4H PRN Eliezer Bottom, NP      . pantoprazole (PROTONIX) injection 40 mg  40 mg Intravenous Q12H Volanda Napoleon, MD   40 mg at 09/19/15 1013    Allergies as of 09/18/2015 - Review Complete 09/18/2015  Allergen Reaction Noted  . Cotellic [cobimetinib] Diarrhea 04/24/2015  . Oxycodone Nausea And Vomiting 11/20/2014  . Pseudoephedrine Other (See Comments) 11/20/2014  . Scopolamine Other (See Comments) 11/20/2014  . Tramadol  11/20/2014     Family History  Problem Relation Age of Onset  . Thyroid disease Mother   . Thyroid disease Sister   . Cancer Sister   . Heart attack Brother     Social History   Social History  .  Marital Status: Married    Spouse Name: N/A  . Number of Children: N/A  . Years of Education: N/A   Occupational History  . Not on file.   Social History Main Topics  . Smoking status: Current Every Day Smoker -- 1.00 packs/day for 36 years    Types: Cigarettes  . Smokeless tobacco: Never Used  . Alcohol Use: No  . Drug Use: No  . Sexual Activity: Yes    Birth Control/ Protection: None   Other Topics Concern  . 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  Physical Exam: Vital signs in last 24 hours: Temp:  [97.3 F (36.3 C)-99 F (37.2 C)] 97.3 F (36.3 C) (01/25 0610) Pulse Rate:  [80-119] 80 (01/25 0610) Resp:  [14-16] 16 (01/25 0610) BP: (107-120)/(48-56) 115/56 mmHg (01/25 0610) SpO2:  [95 %-96 %] 96 % (01/25 0610) Weight:  [58.06 kg (128 lb)] 58.06 kg (128 lb) (01/24 1157) Last BM Date: 09/19/15 General:   Alert,  Well-developed, well-nourished, pleasant and cooperative in NAD Head:  Normocephalic and atraumatic. Eyes:  Sclera clear, no icterus.   Conjunctiva pink. Ears:  Normal auditory acuity. Nose:  No deformity, discharge,  or lesions. Mouth:  No deformity or lesions.  Oropharynx pink but dry mucous membranes Neck:  Supple; no masses or thyromegaly. Lungs:  Clear throughout to auscultation.   No wheezes, crackles, or rhonchi. No acute distress. Heart:  Regular rate and rhythm; no murmurs, clicks, rubs,  or gallops. Abdomen:  Soft, mild generalized tenderness without peritonitis, nondistended. No masses, hepatosplenomegaly or hernias  noted. Normal bowel sounds, without guarding, and without rebound.     Msk:  Symmetrical without gross deformities. Normal posture. Pulses:  Normal pulses noted. Extremities:  Without clubbing or edema. Neurologic:  Alert and  oriented x4;  grossly normal neurologically. Skin:  Intact without significant lesions or rashes. Cervical Nodes:  No significant cervical adenopathy. Psych:  Alert and cooperative. Normal mood and affect.   Lab Results:  Recent Labs  09/18/15 1146 09/18/15 1805 09/19/15 0625  WBC 25.1* 18.5* 14.9*  HGB 11.5* 10.1* 9.7*  HCT 36.1 32.5* 31.4*  PLT 347 Large platelets present 332 317   BMET  Recent Labs  09/18/15 1147 09/18/15 1805 09/19/15 0625  NA 126*  --  134*  K 2.7*  --  3.5  CL 94*  --  103  CO2 27  --  22  GLUCOSE 120*  --  130*  BUN 10  --  7  CREATININE 0.6 0.55 0.48  CALCIUM 8.4  --  7.2*   LFT  Recent Labs  09/19/15 0625  PROT 5.1*  ALBUMIN 2.1*  AST 11*  ALT 9*  ALKPHOS 85  BILITOT 0.5   PT/INR No results for input(s): LABPROT, INR in the last 72 hours.  Studies/Results: Ct Head W Wo Contrast  09/18/2015  CLINICAL DATA:  55 year old female stage IV melanoma. Weakness. Restaging. Subsequent encounter. EXAM: CT HEAD WITHOUT AND WITH CONTRAST TECHNIQUE: Contiguous axial images were obtained from the base of the skull through the vertex without and with intravenous contrast CONTRAST:  87mL OMNIPAQUE IOHEXOL 300 MG/ML  SOLN COMPARISON:  Brain MRI 06/16/2015 and earlier. FINDINGS: Visualized paranasal sinuses  and mastoids are clear. No acute or suspicious osseous lesion identified. Visualized orbit soft tissues are within normal limits. Visualized scalp soft tissues are within normal limits. Mild Calcified atherosclerosis at the skull base. Chronic anterior right MCA territory infarct with encephalomalacia is stable. Stable small chronic lacunar infarct posterior medial right cerebellum. Stable cerebral volume. No midline shift, mass  effect, or evidence of intracranial mass lesion. No ventriculomegaly. No acute intracranial hemorrhage identified. No cortically based acute infarct identified. No abnormal enhancement identified. Major intracranial vascular structures are enhancing. IMPRESSION: 1.  No acute or metastatic intracranial abnormality. 2. Chronic right MCA and right cerebellar ischemia. Electronically Signed   By: Genevie Ann M.D.   On: 09/18/2015 15:53   Acute Abdominal Series  09/18/2015  CLINICAL DATA:  Vomiting and diarrhea for many months. Patient diagnosed with melanoma January, 2016. Initial encounter. EXAM: DG ABDOMEN ACUTE W/ 1V CHEST COMPARISON:  Plain films the abdomen 09/11/2015. PA and lateral chest and CT chest, abdomen and pelvis 07/27/2015. FINDINGS: Single view of the chest demonstrates clear lungs. Mediastinal lymphadenopathy is identified. Heart size is normal. No pneumothorax or pleural effusion. Two views of the abdomen show no free intraperitoneal air or evidence of bowel obstruction. The patient is status post cholecystectomy. Contrast material is noted in the kidneys and urinary bladder from the patient's contrast Head CT scan earlier today. IMPRESSION: No acute abnormality chest or abdomen. No change in mediastinal lymphadenopathy. Electronically Signed   By: Inge Rise M.D.   On: 09/18/2015 17:08    Impression:  1.  Diarrhea, acute on chronic, severe manifested by dehydration and electrolyte abnormalities.  She has chronic diarrhea of unclear etiology, which has likely been exacerbated by recent chemotherapy and immunosuppression. 2.  Abdominal pain, mild, generalized.  Plan:  1.  Check GI stool pathogen panel (C. Diff is already negative). 2.  Start probiotics (Florastor 250 mg po bid). 3.  If stool studies are unrevealing, would likely pursue sigmoidoscopy/colonoscopy with colonic biopsies as next step in management. 4.  Clear liquid diet for now. 5.  [facility] will follow; thank you for the  consultation.   LOS: 1 day   Vylet Maffia M  09/19/2015, 11:54 AM  Pager [phone] If no answer or after 5 PM call [phone]

## 2015-09-20 ENCOUNTER — Encounter (HOSPITAL_COMMUNITY)
Admission: AD | Disposition: A | Discharge status: Home Health | Payer: Self-pay | Source: Ambulatory Visit | Attending: Hematology & Oncology

## 2015-09-20 ENCOUNTER — Encounter (HOSPITAL_COMMUNITY): Payer: Self-pay | Admitting: Gastroenterology

## 2015-09-20 HISTORY — PX: COLONOSCOPY: SHX5424

## 2015-09-20 LAB — COMPREHENSIVE METABOLIC PANEL
ALK PHOS: 74 U/L (ref 38–126)
ALT: 7 U/L — AB (ref 14–54)
AST: 15 U/L (ref 15–41)
Albumin: 2.2 g/dL — ABNORMAL LOW (ref 3.5–5.0)
Anion gap: 8 (ref 5–15)
BILIRUBIN TOTAL: 0.2 mg/dL — AB (ref 0.3–1.2)
BUN: 5 mg/dL — AB (ref 6–20)
CALCIUM: 7.8 mg/dL — AB (ref 8.9–10.3)
CHLORIDE: 110 mmol/L (ref 101–111)
CO2: 20 mmol/L — ABNORMAL LOW (ref 22–32)
CREATININE: 0.54 mg/dL (ref 0.44–1.00)
Glucose, Bld: 182 mg/dL — ABNORMAL HIGH (ref 65–99)
Potassium: 4.4 mmol/L (ref 3.5–5.1)
Sodium: 138 mmol/L (ref 135–145)
Total Protein: 5.1 g/dL — ABNORMAL LOW (ref 6.5–8.1)

## 2015-09-20 LAB — CBC WITH DIFFERENTIAL/PLATELET
BASOS ABS: 0 10*3/uL (ref 0.0–0.1)
Basophils Relative: 0 %
Eosinophils Absolute: 0 10*3/uL (ref 0.0–0.7)
Eosinophils Relative: 0 %
HEMATOCRIT: 30.7 % — AB (ref 36.0–46.0)
HEMOGLOBIN: 9.2 g/dL — AB (ref 12.0–15.0)
LYMPHS ABS: 1.8 10*3/uL (ref 0.7–4.0)
LYMPHS PCT: 9 %
MCH: 25.2 pg — AB (ref 26.0–34.0)
MCHC: 30 g/dL (ref 30.0–36.0)
MCV: 84.1 fL (ref 78.0–100.0)
Monocytes Absolute: 0.9 10*3/uL (ref 0.1–1.0)
Monocytes Relative: 5 %
NEUTROS ABS: 16.8 10*3/uL — AB (ref 1.7–7.7)
Neutrophils Relative %: 86 %
PLATELETS: 355 10*3/uL (ref 150–400)
RBC: 3.65 MIL/uL — AB (ref 3.87–5.11)
RDW: 16.7 % — ABNORMAL HIGH (ref 11.5–15.5)
WBC: 19.5 10*3/uL — AB (ref 4.0–10.5)

## 2015-09-20 LAB — GASTROINTESTINAL PANEL BY PCR, STOOL (REPLACES STOOL CULTURE)
ASTROVIRUS: NOT DETECTED
Adenovirus F40/41: NOT DETECTED
CYCLOSPORA CAYETANENSIS: NOT DETECTED
Campylobacter species: NOT DETECTED
Cryptosporidium: NOT DETECTED
E. COLI O157: NOT DETECTED
ENTAMOEBA HISTOLYTICA: NOT DETECTED
ENTEROTOXIGENIC E COLI (ETEC): NOT DETECTED
Enteroaggregative E coli (EAEC): NOT DETECTED
Enteropathogenic E coli (EPEC): NOT DETECTED
Giardia lamblia: NOT DETECTED
NOROVIRUS GI/GII: NOT DETECTED
Plesimonas shigelloides: NOT DETECTED
ROTAVIRUS A: NOT DETECTED
SALMONELLA SPECIES: NOT DETECTED
SAPOVIRUS (I, II, IV, AND V): NOT DETECTED
SHIGA LIKE TOXIN PRODUCING E COLI (STEC): NOT DETECTED
SHIGELLA/ENTEROINVASIVE E COLI (EIEC): NOT DETECTED
VIBRIO CHOLERAE: NOT DETECTED
Vibrio species: NOT DETECTED
Yersinia enterocolitica: NOT DETECTED

## 2015-09-20 LAB — GLUCOSE, CAPILLARY
GLUCOSE-CAPILLARY: 160 mg/dL — AB (ref 65–99)
GLUCOSE-CAPILLARY: 105 mg/dL — AB (ref 65–99)
GLUCOSE-CAPILLARY: 152 mg/dL — AB (ref 65–99)
Glucose-Capillary: 166 mg/dL — ABNORMAL HIGH (ref 65–99)
Glucose-Capillary: 85 mg/dL (ref 65–99)

## 2015-09-20 LAB — PREALBUMIN: PREALBUMIN: 5.9 mg/dL — AB (ref 18–38)

## 2015-09-20 LAB — TRIGLYCERIDES: Triglycerides: 92 mg/dL (ref <150)

## 2015-09-20 LAB — MAGNESIUM: Magnesium: 2.1 mg/dL (ref 1.7–2.4)

## 2015-09-20 LAB — PHOSPHORUS: Phosphorus: 2.1 mg/dL — ABNORMAL LOW (ref 2.5–4.6)

## 2015-09-20 SURGERY — COLONOSCOPY
Anesthesia: Moderate Sedation

## 2015-09-20 MED ORDER — SODIUM PHOSPHATE 3 MMOLE/ML IV SOLN
10.0000 mmol | Freq: Once | INTRAVENOUS | Status: AC
  Administered 2015-09-20: 10 mmol via INTRAVENOUS
  Filled 2015-09-20: qty 3.33

## 2015-09-20 MED ORDER — FENTANYL CITRATE (PF) 100 MCG/2ML IJ SOLN
INTRAMUSCULAR | Status: DC | PRN
  Administered 2015-09-20 (×2): 12.5 ug via INTRAVENOUS
  Administered 2015-09-20: 25 ug via INTRAVENOUS
  Administered 2015-09-20 (×2): 12.5 ug via INTRAVENOUS

## 2015-09-20 MED ORDER — POTASSIUM CHLORIDE IN NACL 40-0.9 MEQ/L-% IV SOLN
INTRAVENOUS | Status: DC
  Administered 2015-09-20 – 2015-09-24 (×5): 45 mL/h via INTRAVENOUS
  Filled 2015-09-20 (×8): qty 1000

## 2015-09-20 MED ORDER — MIDAZOLAM HCL 5 MG/ML IJ SOLN
INTRAMUSCULAR | Status: AC
Start: 2015-09-20 — End: 2015-09-20
  Filled 2015-09-20: qty 2

## 2015-09-20 MED ORDER — PANTOPRAZOLE SODIUM 40 MG IV SOLR
40.0000 mg | INTRAVENOUS | Status: DC
  Administered 2015-09-20 – 2015-09-24 (×5): 40 mg via INTRAVENOUS
  Filled 2015-09-20 (×6): qty 40

## 2015-09-20 MED ORDER — FAT EMULSION 20 % IV EMUL
240.0000 mL | INTRAVENOUS | Status: DC
  Filled 2015-09-20: qty 250

## 2015-09-20 MED ORDER — FAT EMULSION 20 % IV EMUL
240.0000 mL | INTRAVENOUS | Status: AC
  Administered 2015-09-20: 240 mL via INTRAVENOUS
  Filled 2015-09-20: qty 250

## 2015-09-20 MED ORDER — CLINIMIX E/DEXTROSE (5/15) 5 % IV SOLN
INTRAVENOUS | Status: AC
  Administered 2015-09-20: 17:00:00 via INTRAVENOUS
  Filled 2015-09-20: qty 1440

## 2015-09-20 MED ORDER — SODIUM CHLORIDE 0.9 % IV SOLN
INTRAVENOUS | Status: DC
  Administered 2015-09-20: 500 mL via INTRAVENOUS

## 2015-09-20 MED ORDER — MIDAZOLAM HCL 5 MG/5ML IJ SOLN
INTRAMUSCULAR | Status: DC | PRN
  Administered 2015-09-20 (×5): 1 mg via INTRAVENOUS
  Administered 2015-09-20: 2 mg via INTRAVENOUS

## 2015-09-20 MED ORDER — FENTANYL CITRATE (PF) 100 MCG/2ML IJ SOLN
INTRAMUSCULAR | Status: AC
  Filled 2015-09-20: qty 2

## 2015-09-20 MED ORDER — CLOTRIMAZOLE 1 % VA CREA
1.0000 | TOPICAL_CREAM | Freq: Every day | VAGINAL | Status: DC
  Administered 2015-09-20 – 2015-09-23 (×3): 1 via VAGINAL
  Filled 2015-09-20: qty 45

## 2015-09-20 MED ORDER — CLINIMIX E/DEXTROSE (5/15) 5 % IV SOLN
INTRAVENOUS | Status: DC
  Filled 2015-09-20: qty 1440

## 2015-09-20 NOTE — Progress Notes (Signed)
The diarrhea seems to be doing much better. The C. difficile is negative. She says she only had 1 episode yesterday. I does wonder if this is not immune mediated colitis. She is on Solu-Medrol.  She now has some TNA going. I appreciate the help of pharmacy on this. She really wants to try some full liquids. I think we can give this a try. If she tolerates this well, then I will stop the TNA.  I appreciate Dr. Erlinda Hong consultation. He wants to try to hold on doing a colonoscopy on her.  A gastric pathogen panel is pending.  She's not complaining of any pain. She's had no bleeding.  I want to try to get her out of bed more. Hopefully, the nurses will be able to walk her a little bit more.  She's had no fever.  There's been no cough or shortness of breath.  On her physical exam, her vital signs are all stable. Her blood pressures 112/54. Her pulse is 78. Temperature 90.8 degrees. Her lungs are clear. Cardiac exam regular rate and rhythm with no murmurs, rubs or bruits. Abdomen is soft. There is no distention. She has decent bowel sounds. There is no guarding or rebound tenderness. Extremities shows no clubbing, cyanosis or edema.  For now, I will continue her on the Solu-Medrol. I will also keep her on the Flagyl despite the C. difficile be negative. She is on a probiotic.  We will see what her reaction to a full liquids will be.  I very much appreciate pharmacies help with the TNA.  Her potassium to 4.4. I will have to make sure we monitor that.  The nurses on [address] are doing a fantastic job with caring for her.!!!   Lum Keas  Colossians 3:23

## 2015-09-20 NOTE — Progress Notes (Signed)
PARENTERAL NUTRITION CONSULT NOTE - INITIAL  Pharmacy Consult for TPN Indication: Possible Colitis  Allergies  Allergen Reactions  . Cotellic [Cobimetinib] Diarrhea  . Oxycodone Nausea And Vomiting  . Pseudoephedrine Other (See Comments)    Makes patient feel weird  . Scopolamine Other (See Comments)  . Tramadol     Other reaction(s): GI Upset (intolerance)    Patient Measurements: Weight: 138 lb 10.7 oz (62.9 kg) Usual Weight: 200 lbs   Vital Signs: Temp: 98 F (36.7 C) (01/26 0554) Temp Source: Oral (01/26 0554) BP: 112/54 mmHg (01/26 0554) Pulse Rate: 78 (01/26 0554) Intake/Output from previous day: 01/25 0701 - 01/26 0700 In: [id number] [P.O.:720; I.V.:805] Out: -   Labs:  Recent Labs  09/18/15 1805 09/19/15 0625 09/20/15 0607  WBC 18.5* 14.9* 19.5*  HGB 10.1* 9.7* 9.2*  HCT 32.5* 31.4* 30.7*  PLT 332 317 355     Recent Labs  09/18/15 1147 09/18/15 1805 09/19/15 0625 09/20/15 0607  NA 126*  --  134* 138  K 2.7*  --  3.5 4.4  CL 94*  --  103 110  CO2 27  --  22 20*  GLUCOSE 120*  --  130* 182*  BUN 10  --  7 5*  CREATININE 0.6 0.55 0.48 0.54  CALCIUM 8.4  --  7.2* 7.8*  MG  --   --  1.6* 2.1  PHOS  --   --  2.8 2.1*  PROT 5.3*  --  5.1* 5.1*  ALBUMIN 2.3*  --  2.1* 2.2*  AST 14  --  11* 15  ALT 7*  --  9* 7*  ALKPHOS 99*  --  85 74  BILITOT 0.80  --  0.5 0.2*   Estimated Creatinine Clearance: 67.5 mL/min (by C-G formula based on Cr of 0.54).    Recent Labs  09/19/15 1121 09/19/15 1704 09/19/15 2140  GLUCAP 112* 87 160*    Insulin Requirements in the past 24 hours:  2 units / 24 hours  Current Nutrition:  Boost Breeze TID 250 kcal, 9 grams of Protein 1/26 Advanced from CLD to FLD, now NPO for colonoscopy.  IVF:  NS with KCl 40 mEq/L @ 75 ml/hr  Central access: Implanted Port Right Chest 06/26/15 TPN start date: 09/19/15  ASSESSMENT                                                                                      HPI: [name]  with metastatic melanoma undergoing immunotherapy presents with nausea, vomiting, and diarrhea for several weeks.  KUB and abdominal series negative for obstruction and ileus.  Pt receiving steroids, empiric CDiff treatment, and IVFs. Pharmacy consulted to start TPN for possible colitis.     Significant events:  1/25: Pt reports good appetite and feeling hungry, ready for diet advancement. Denies N/V, continues to have diarrhea. RD discussed enteral/PO diet advancement options with oncologist however per Dr. Marin Olp, TPN is preferred.  1/26 Diarrhea had improved, but now increasing again. GI scheduled colonoscopy and biopsy today for which she was made NPO.  Plan to continue TPN until patient tolerates full liquids.  Today:   Glucose - No  hx diabetes.  CBGs near goal (< 150) with range 87-166 on IV steroids q24h.  Managed with SSI.  Electrolytes - Na and K both improved, Corr Ca 9.24 WNL, Mag improved (s/p supplement 1/25), Phos decreased.  Renal - Stable  LFTs -Low  TGs - 180 on 04/24/15 (goal < 150)  Prealbumin - 8 (09/11/15), 5.9 (1/26)  Medications of note: Marinol 2.5 mg BID, Imodium PRN, MVI daily, Protonix IV  NUTRITIONAL GOALS                                                                                   RD recs (1/25): 1750-1950 kcal and 90-100 g protein/day. Breeze supplements TID to provide: 750 kcal and 27 g protein/day  Clinimix E 5/15 at a goal rate of 83 ml/hr + 20% fat emulsion at 67ml/hr to provide: 100 g/day protein, 1894 Kcal/day.  PLAN                                                                                                               Now: NaPhos 10 mmol IV once today.  At 1800 today:  Titrate to Clinimix E 5/15 at 60 ml/hr.  20% fat emulsion 10 ml/hr.  Plan to advance as tolerated to the goal rate.  Continue PO multivitamin with trace elements  Reduce IVF to 45 ml/hr.  Continue sensitive SSI ACHS.   TPN lab panels on Mondays & Thursdays.   BMET, Mag, Phos in AM to assess for re-feeding syndrome.  Follow up diet advancements / need for TPN on 1/27.  If tolerating FLD, d/c TPN per Dr. Antonieta Pert notes.  Gretta Arab PharmD, BCPS Pager [phone] 09/20/2015 10:32 AM

## 2015-09-20 NOTE — Progress Notes (Signed)
Nutrition Follow-up  DOCUMENTATION CODES:   Not applicable  INTERVENTION:  Please monitor pt labs Mg, K, and Phos. MD to replete as needed. Pt at risk for refeeding syndrome due to significant weight loss, inability to tolerate PO, and low Phos -Monitor for diet advancement per MD -TPN per pharmacy -Upon diet advancement continue Boost Breeze TID   NUTRITION DIAGNOSIS:   Unintentional weight loss related to other (see comment), nausea, vomiting (diahhrea ) as evidenced by percent weight loss. -Ongoing   GOAL:   Patient will meet greater than or equal to 90% of their needs  -Progressing  MONITOR:   PO intake, Supplement acceptance, Diet advancement, Labs, Weight trends, I & O's, Other (Comment) (TPN)  ASSESSMENT:   55 year old white female. She has metastatic melanoma. We have her on Nivolumab with Yervoy. She's had one cycle to date. She's had issues with diarrhea and nausea and vomiting for the past week. We've been trying to treat her in the office. She was better in the office with IV fluids. We gave her anti-medics. Gave her steroids. However, over the weekend, the diarrhea started. She is having 15 episodes a day. She's been having emesis. She came to the office for her second cycle of treatment on Tuesday. She was clearly in no shape to take treatment. She was dehydrated. She just did not look well.  Pt seen for TPN follow up. Pt reports that she had a BM this morning, which was loose. GI following and has recommended sigmoidoscopy/colonoscopy for later today. Pt now NPO. Pt consumed broth, ice cream, and boost breeze this morning.   Pt is receiving TPN Clinimix 5/20 @ 59mL/hr with fat emulsion 20% @ 5 mL/hr. This provides 36 g of protein, 874 kcal.   Reviewed medications. Reviewed labs; Phos 2.1, CBG 87-166.  Diet Order:  TPN (CLINIMIX-E) Adult Diet NPO time specified Except for: Sips with Meds  Skin:  Reviewed, no issues  Last BM:  1/26 per pt  Height:   Ht  Readings from Last 1 Encounters:  09/18/15 5\' 1"  (1.549 m)    Weight:   Wt Readings from Last 1 Encounters:  09/20/15 138 lb 10.7 oz (62.9 kg)    Ideal Body Weight:  48 kg  BMI:  Body mass index is 26.21 kg/(m^2).  Estimated Nutritional Needs:   Kcal:  [id number]  Protein:  90-100 grams  Fluid:  1.7-1.9 L  EDUCATION NEEDS:   No education needs identified at this time  [place], Dietetic Intern Pager: [phone]

## 2015-09-20 NOTE — Progress Notes (Signed)
As per Dr. Antonieta Pert note, pt's diarr is somewhat better--but since he saw her, she had another gelatinous brown stool (approx 150 cc's) in diaper.  No visible blood.  Pt is in no distress.  Chest clear, heart nl; abd has diffuse fullness and mild/moderate diffuse tenderness to palpation, without discrete mass effect or peritoneal findings.  (CT of abd/pelv from Dec. Reviewed--pt has extensive intra-abd metastatic disease.)  Plan:  I have recomm'd, and pt is agreeable, that we do an unprepped colonoscopy this afternoon for the purpose of getting a general image of the colonic mucosa, and more especially, mucosal bx's to look for microscopic colitis.  In this manner, we can have the biopsies back by tomorrow--otherwise, we would be waiting until Monday or Tuesday for results.  Given the unprepped nature of this exam, it will not be (nor is it intended to be) a complete or definitive evaluation of the colon.  Petra Kuba, purpose, and risks of colonoscopy d/w pt and she consents.   Cleotis Nipper, M.D. Pager [phone] If no answer or after 5 PM call [phone]

## 2015-09-20 NOTE — Op Note (Signed)
[facility] [place], [address]   COLONOSCOPY PROCEDURE REPORT  PATIENT: Felicia Richmond, Felicia Richmond  MR#: [id number] BIRTHDATE: 08-10-1961 , 46  yrs. old GENDER: female ENDOSCOPIST: Marilynn Latino, MD REFERRED BY:  Dr. Marin Olp PROCEDURE DATE:  Oct 04, 2015 PROCEDURE:   Colonoscopy with biopsies ASA CLASS:   III INDICATIONS: Diarrhea in a patient previously on chemotherapy for metastatic malignant melanoma MEDICATIONS: Fentanyl 75 mcg, Versed 7 mg IV  DESCRIPTION OF PROCEDURE:   The patient was brought from her hospital room to the [facility] long endoscopy unit. Intentionally, the procedure was done unprepped, since the purpose of the procedure was to simply obtain random mucosal biopsies to check for microscopic colitis, not to do a screening exam for small or focal abnormalities.After the risks and benefits and of the procedure were explained, informed consent was obtained.   The patient had received subcutaneous Lovenox earlier today for VTE prophylaxis. Digital exam was unremarkable apart from some erythematous dermatitis in the gluteal cleft.         The Pentax pediatric colonoscope       was introduced through the anus and advanced to the ileocecal valve      .  The quality of the prep was  adequate, without any prep having been administered.    The instrument was then slowly withdrawn as the colon was fully examined. Estimated blood loss was minimal from the biopsies..   This was essentially a normal exam.       there was a question of a small, transiently seen polyp in the sigmoid regionabout 5 mm in diameter, not seen during pullback, and not removed. There also appeared to be some mild sigmoid diverticulosis. Despite the absence of a prep, there was no formed stool in the colon whatsoever.There were some puddles of liquid stool and gelatinous material. I did not see any blood.  The colonic mucosa was normal. There was some questionable edema of the sigmoid  haustrations, but nothing impressive. The mucosa appeared healthy, not overtly edematous, and certainly without significant erythema, granularity, exudate, or ulcerations.  Random mucosal biopsies were taken along the length of the colon, except the rectum.  Retroflexion was not performed in the rectum.          The scope was then withdrawn from the patient and the procedure completed.  WITHDRAWAL TIME: not recorded, not pertinent to a non-screening examination  COMPLICATIONS: There were no immediate complications.  ENDOSCOPIC IMPRESSION: Grossly normal colonoscopy. No overt colitis or cause for severe diarrhea endoscopically evident. Possible small sigmoid polyp and mild sigmoid diverticulosis  RECOMMENDATIONS: await pathology results  REPEAT EXAM: not necessary  cc:  _______________________________ eSignedRonald Lobo, MD 2015/10/04 4:13 PM   CPT CODES: ICD CODES:  The ICD and CPT codes recommended by this software are interpretations from the data that the clinical staff has captured with the software.  The verification of the translation of this report to the ICD and CPT codes and modifiers is the sole responsibility of the health care institution and practicing physician where this report was generated.  [employer] will not be held responsible for the validity of the ICD and CPT codes included on this report.  AMA assumes no liability for data contained or not contained herein. CPT is a Designer, television/film set of the [employer].   PATIENT NAME:  [name], [name] MR#: [id number]

## 2015-09-20 NOTE — Progress Notes (Signed)
Colonoscopy well tolerated. Grossly normal exam to the proximal colon.  Random biopsies pending, hopefully ready tomorrow.  Findings discussed with patient and husband.  Please see dictated procedure report for more details. Have resumed full liquid diet.  Cleotis Nipper, M.D. Pager [phone] If no answer or after 5 PM call [phone]

## 2015-09-21 ENCOUNTER — Encounter (HOSPITAL_COMMUNITY): Payer: Self-pay | Admitting: Gastroenterology

## 2015-09-21 DIAGNOSIS — B379 Candidiasis, unspecified: Secondary | ICD-10-CM

## 2015-09-21 DIAGNOSIS — K52832 Lymphocytic colitis: Principal | ICD-10-CM

## 2015-09-21 DIAGNOSIS — E0590 Thyrotoxicosis, unspecified without thyrotoxic crisis or storm: Secondary | ICD-10-CM

## 2015-09-21 LAB — GLUCOSE, CAPILLARY
GLUCOSE-CAPILLARY: 109 mg/dL — AB (ref 65–99)
Glucose-Capillary: 82 mg/dL (ref 65–99)
Glucose-Capillary: 97 mg/dL (ref 65–99)

## 2015-09-21 LAB — CBC WITH DIFFERENTIAL/PLATELET
BASOS ABS: 0 10*3/uL (ref 0.0–0.1)
BASOS PCT: 0 %
EOS ABS: 0 10*3/uL (ref 0.0–0.7)
Eosinophils Relative: 0 %
HEMATOCRIT: 32.6 % — AB (ref 36.0–46.0)
HEMOGLOBIN: 9.8 g/dL — AB (ref 12.0–15.0)
Lymphocytes Relative: 13 %
Lymphs Abs: 2.4 10*3/uL (ref 0.7–4.0)
MCH: 25.3 pg — ABNORMAL LOW (ref 26.0–34.0)
MCHC: 30.1 g/dL (ref 30.0–36.0)
MCV: 84 fL (ref 78.0–100.0)
MONO ABS: 1 10*3/uL (ref 0.1–1.0)
MONOS PCT: 5 %
NEUTROS ABS: 15.7 10*3/uL — AB (ref 1.7–7.7)
NEUTROS PCT: 82 %
Platelets: 387 10*3/uL (ref 150–400)
RBC: 3.88 MIL/uL (ref 3.87–5.11)
RDW: 16.8 % — AB (ref 11.5–15.5)
WBC: 19.1 10*3/uL — ABNORMAL HIGH (ref 4.0–10.5)

## 2015-09-21 LAB — COMPREHENSIVE METABOLIC PANEL
ALBUMIN: 2.5 g/dL — AB (ref 3.5–5.0)
ALT: 9 U/L — ABNORMAL LOW (ref 14–54)
ANION GAP: 8 (ref 5–15)
AST: 15 U/L (ref 15–41)
Alkaline Phosphatase: 70 U/L (ref 38–126)
BUN: 7 mg/dL (ref 6–20)
CO2: 20 mmol/L — AB (ref 22–32)
Calcium: 7.8 mg/dL — ABNORMAL LOW (ref 8.9–10.3)
Chloride: 109 mmol/L (ref 101–111)
Creatinine, Ser: 0.5 mg/dL (ref 0.44–1.00)
GFR calc Af Amer: >60 mL/min (ref >60)
GFR calc non Af Amer: >60 mL/min (ref >60)
GLUCOSE: 131 mg/dL — AB (ref 65–99)
POTASSIUM: 4.2 mmol/L (ref 3.5–5.1)
SODIUM: 137 mmol/L (ref 135–145)
Total Bilirubin: 0.2 mg/dL — ABNORMAL LOW (ref 0.3–1.2)
Total Protein: 5.4 g/dL — ABNORMAL LOW (ref 6.5–8.1)

## 2015-09-21 LAB — MAGNESIUM: Magnesium: 1.9 mg/dL (ref 1.7–2.4)

## 2015-09-21 LAB — PHOSPHORUS: Phosphorus: 2.7 mg/dL (ref 2.5–4.6)

## 2015-09-21 MED ORDER — FLUCONAZOLE IN SODIUM CHLORIDE 200-0.9 MG/100ML-% IV SOLN
200.0000 mg | INTRAVENOUS | Status: DC
Start: 2015-09-21 — End: 2015-09-24
  Administered 2015-09-21 – 2015-09-23 (×3): 200 mg via INTRAVENOUS
  Filled 2015-09-21 (×4): qty 100

## 2015-09-21 MED ORDER — DIPHENOXYLATE-ATROPINE 2.5-0.025 MG PO TABS
2.0000 | ORAL_TABLET | Freq: Four times a day (QID) | ORAL | Status: DC | PRN
  Administered 2015-09-21 (×2): 2 via ORAL
  Filled 2015-09-21 (×2): qty 2

## 2015-09-21 MED ORDER — BUDESONIDE 3 MG PO CPEP
9.0000 mg | ORAL_CAPSULE | Freq: Every day | ORAL | Status: DC
  Administered 2015-09-22 – 2015-09-25 (×4): 9 mg via ORAL
  Filled 2015-09-21 (×4): qty 3

## 2015-09-21 MED ORDER — LOPERAMIDE HCL 2 MG PO CAPS
2.0000 mg | ORAL_CAPSULE | Freq: Four times a day (QID) | ORAL | Status: DC
  Administered 2015-09-21 – 2015-09-22 (×5): 2 mg via ORAL
  Filled 2015-09-21 (×9): qty 1

## 2015-09-21 MED ORDER — METHYLPREDNISOLONE SODIUM SUCC 125 MG IJ SOLR
60.0000 mg | Freq: Four times a day (QID) | INTRAMUSCULAR | Status: DC
  Administered 2015-09-21 – 2015-09-24 (×11): 60 mg via INTRAVENOUS
  Filled 2015-09-21 (×11): qty 2

## 2015-09-21 NOTE — Progress Notes (Signed)
Addendum to previous note:  In light of starting budesonide, it is ok from GI perspective to stop her Solu-Medrol at any time.  (It is not clear to me if she needs it for any other reason, but as far as the diarrhea is concerned, I think it would be ok to try stopping it.)  Cleotis Nipper, M.D. Pager [phone] If no answer or after 5 PM call [phone]

## 2015-09-21 NOTE — Progress Notes (Signed)
Bx's + for lymphocytic colitis.  D/w pt and husband.  Still having some diarr w/ incontinence today (approx 8 BM's).  Pt developed severe, prolonged constip on morphine about a month ago, so we need to be cautious to avoid "overshoot" constipation.  Plan:  1.  Begin budesonide tomorrow morning (Entocort 9 mg po qAM) 2.  D/C Lomotil (b/o CNS side effects) 3.  Resume Imodium 2mg  qid (holding dose if no BM in preceding 12 hrs) 4.  Start low-fiber diet tomorrow 5.  We will follow at a distance--every several days.  Please call us if earlier input is needed.  Cleotis Nipper, M.D. Pager [phone] If no answer or after 5 PM call [phone]

## 2015-09-21 NOTE — Progress Notes (Signed)
[place] NOTE  Pharmacy Consult for TPN Indication: Possible Colitis  Allergies  Allergen Reactions  . Cotellic [Cobimetinib] Diarrhea  . Oxycodone Nausea And Vomiting  . Pseudoephedrine Other (See Comments)    Makes patient feel weird  . Scopolamine Other (See Comments)  . Tramadol     Other reaction(s): GI Upset (intolerance)    Patient Measurements: Weight: 138 lb 10.7 oz (62.9 kg) Usual Weight: 200 lbs   Vital Signs: Temp: 98.1 F (36.7 C) (01/27 0631) Temp Source: Oral (01/27 0631) BP: 121/57 mmHg (01/27 0631) Pulse Rate: 77 (01/27 0631) Intake/Output from previous day: 01/26 0701 - 01/27 0700 In: 600 [P.O.:600] Out: -   Labs:  Recent Labs  09/18/15 1805 09/19/15 0625 09/20/15 0607  WBC 18.5* 14.9* 19.5*  HGB 10.1* 9.7* 9.2*  HCT 32.5* 31.4* 30.7*  PLT 332 317 355     Recent Labs  09/18/15 1147 09/18/15 1805 09/19/15 0625 09/20/15 0607  NA 126*  --  134* 138  K 2.7*  --  3.5 4.4  CL 94*  --  103 110  CO2 27  --  22 20*  GLUCOSE 120*  --  130* 182*  BUN 10  --  7 5*  CREATININE 0.6 0.55 0.48 0.54  CALCIUM 8.4  --  7.2* 7.8*  MG  --   --  1.6* 2.1  PHOS  --   --  2.8 2.1*  PROT 5.3*  --  5.1* 5.1*  ALBUMIN 2.3*  --  2.1* 2.2*  AST 14  --  11* 15  ALT 7*  --  9* 7*  ALKPHOS 99*  --  85 74  BILITOT 0.80  --  0.5 0.2*  PREALBUMIN  --   --   --  5.9*  TRIG  --   --   --  92   Estimated Creatinine Clearance: 67.5 mL/min (by C-G formula based on Cr of 0.54).    Recent Labs  09/20/15 1147 09/20/15 1704 09/20/15 2129  GLUCAP 105* 85 152*    Insulin Requirements in the past 24 hours:  2 units / 24 hours  Current Nutrition:  Boost Breeze TID 250 kcal, 9 grams of Protein 1/26 Advanced to FLD after colonoscopy.  IVF:  NS with KCl 40 mEq/L @ 45 ml/hr  Central access: Implanted Port Right Chest 06/26/15 TPN start date: 09/19/15  ASSESSMENT                                                                                       HPI: 16 yoF with metastatic melanoma undergoing immunotherapy presents with nausea, vomiting, and diarrhea for several weeks.  KUB and abdominal series negative for obstruction and ileus.  Pt receiving steroids, empiric CDiff treatment, and IVFs. Pharmacy consulted to start TPN for possible colitis.     Significant events:  1/25: Pt reports good appetite and feeling hungry, ready for diet advancement. Denies N/V, continues to have diarrhea. RD discussed enteral/PO diet advancement options with oncologist however per Dr. Marin Olp, TPN is preferred.  1/26 Diarrhea had improved, but now increasing again. GI scheduled colonoscopy and biopsy today for which she was made NPO.  Plan to continue TPN until patient tolerates full liquids. 1/27 Tolerating full liquid diet and supplements.  Reports no nausea and no diarrhea overnight.  Today:   Glucose - No hx diabetes.  CBGs at goal (< 150) with range 85-152 on IV steroids q24h.  Managed with SSI.  Electrolytes - WNL (Phos improved after supplement on 1/26).  No sxs of refeeding syndrome.    Renal - Stable  LFTs -Low  TGs - 92 (1/26)   Prealbumin - 8 (09/11/15), 5.9 (1/26)  Medications of note: Marinol 2.5 mg BID, Imodium PRN, MVI daily, Protonix IV, Lomotil PRN  NUTRITIONAL GOALS                                                                                   RD recs (1/25): 1750-1950 kcal and 90-100 g protein/day. Breeze supplements TID to provide: 750 kcal and 27 g protein/day  Clinimix E 5/15 at a goal rate of 83 ml/hr + 20% fat emulsion at 77ml/hr to provide: 100 g/day protein, 1894 Kcal/day.  PLAN                                                                                                                 Discontinue TPN at 1800 today when current bag finishes infusion.  TPN labs discontinued.  IVF per MD orders.  Continue PO multivitamin with trace elements  Gretta Arab PharmD, BCPS Pager [phone] 09/21/2015 7:23  AM

## 2015-09-21 NOTE — Progress Notes (Signed)
Felicia Richmond had her colonoscopy yesterday. I am very thankful for GIs help. It appears that the colonoscopy was unremarkable grossly. Biopsies were taken. Maybe there might be a preliminary results today.  She's still having some diarrhea. We will going try her on some Lomotil. She continues on Solu-Medrol. She has a little bit of thrush. I'll give her some Diflucan.  She's had no fever.  Is no blood work back yet today.  She did ambulate a little bit yesterday.  She's had no obvious bleeding. Her pain control seems to be doing quite well right now.  She is on a full liquid diet. I'm not sure we can really move ahead until the diarrhea gets better. Hopefully, the pathology will show [place] what might be going on.  Her physical exam shows stable vital signs. Blood pressure is 121/57. Temperature 98.1. Pulse 77. Abdomen is soft. Bowel sounds are slightly decreased. There is no guarding or rebound tenderness. She has no mass. There is no fluid wave. There is no palpable liver or spleen tip. Lungs are clear bilaterally. Cardiac exam regular rate and rhythm with no murmurs, rubs or bruits. Extremity shows no clubbing, cyanosis or edema.  Felicia Richmond continues on TNA. I did this will be greatly helpful for her until the diarrhea gets better. We cannot let her go home until I know that she will be able to tolerate a morsels [place] diet without having diarrhea. Again, the pathology from the colonic biopsies will help.  We will see what her labs show.  The staff on [address] or doing a great job with her.  Pete E.  Ephesians 6:9

## 2015-09-22 DIAGNOSIS — R112 Nausea with vomiting, unspecified: Secondary | ICD-10-CM

## 2015-09-22 LAB — CBC WITH DIFFERENTIAL/PLATELET
BASOS PCT: 0 %
Basophils Absolute: 0 10*3/uL (ref 0.0–0.1)
EOS ABS: 0 10*3/uL (ref 0.0–0.7)
Eosinophils Relative: 0 %
HEMATOCRIT: 33.3 % — AB (ref 36.0–46.0)
Hemoglobin: 10 g/dL — ABNORMAL LOW (ref 12.0–15.0)
Lymphocytes Relative: 8 %
Lymphs Abs: 1.3 10*3/uL (ref 0.7–4.0)
MCH: 25.3 pg — AB (ref 26.0–34.0)
MCHC: 30 g/dL (ref 30.0–36.0)
MCV: 84.1 fL (ref 78.0–100.0)
MONO ABS: 0.2 10*3/uL (ref 0.1–1.0)
MONOS PCT: 1 %
Neutro Abs: 14.9 10*3/uL — ABNORMAL HIGH (ref 1.7–7.7)
Neutrophils Relative %: 91 %
Platelets: 400 10*3/uL (ref 150–400)
RBC: 3.96 MIL/uL (ref 3.87–5.11)
RDW: 16.7 % — AB (ref 11.5–15.5)
WBC: 16.4 10*3/uL — ABNORMAL HIGH (ref 4.0–10.5)

## 2015-09-22 LAB — COMPREHENSIVE METABOLIC PANEL
ALK PHOS: 68 U/L (ref 38–126)
ALT: 11 U/L — AB (ref 14–54)
AST: 18 U/L (ref 15–41)
Albumin: 2.3 g/dL — ABNORMAL LOW (ref 3.5–5.0)
Anion gap: 8 (ref 5–15)
BUN: 6 mg/dL (ref 6–20)
CALCIUM: 7.8 mg/dL — AB (ref 8.9–10.3)
CHLORIDE: 109 mmol/L (ref 101–111)
CO2: 22 mmol/L (ref 22–32)
CREATININE: 0.5 mg/dL (ref 0.44–1.00)
GFR calc Af Amer: >60 mL/min (ref >60)
GFR calc non Af Amer: >60 mL/min (ref >60)
GLUCOSE: 120 mg/dL — AB (ref 65–99)
Potassium: 4.6 mmol/L (ref 3.5–5.1)
Sodium: 139 mmol/L (ref 135–145)
Total Bilirubin: 0.4 mg/dL (ref 0.3–1.2)
Total Protein: 5.2 g/dL — ABNORMAL LOW (ref 6.5–8.1)

## 2015-09-22 LAB — GLUCOSE, CAPILLARY: Glucose-Capillary: 93 mg/dL (ref 65–99)

## 2015-09-22 NOTE — Progress Notes (Signed)
Felicia Richmond   DOB:Apr 13, 1961   TT#:[id number]   ESP#:[id number]  Subjective: staying in bed except to BR; had a "poor" breakfast this AM, but at least ate something solid (fruit cup). Liquid BM 30 min pc and then again an hour later. No BMs overnight, about 10 BMs yesterday, mostly liquid. No blood, no cramps or pain. No family in room   Objective: middle aged White woman examined in bed Filed Vitals:   09/21/15 2103 09/22/15 0611  BP: 129/69 130/64  Pulse: 74 70  Temp: 97.9 F (36.6 C) 98.2 F (36.8 C)  Resp: 16 16    Body mass index is 27.47 kg/(m^2).  Intake/Output Summary (Last 24 hours) at 09/22/15 1028 Last data filed at 09/21/15 1814  Gross per 24 hour  Intake    240 ml  Output      0 ml  Net    240 ml     Sclerae unicteric  Oropharynx clear-- no thrush noted  Lungs clear -- auscultated anterolaterally  Heart regular rate and rhythm  Abdomen no increased BS, NT, no masses palpated  MSK no focal spinal tenderness  Neuro nonfocal   CBG (last 3)   Recent Labs  09/21/15 1644 09/21/15 2153 09/22/15 0725  GLUCAP 82 97 93     Labs:  Lab Results  Component Value Date   WBC 16.4* 09/22/2015   HGB 10.0* 09/22/2015   HCT 33.3* 09/22/2015   MCV 84.1 09/22/2015   PLT 400 09/22/2015   NEUTROABS 14.9* 09/22/2015    _0 @  Urine Studies No results for input(s): UHGB, CRYS in the last 72 hours.  Invalid input(s): UACOL, UAPR, USPG, UPH, UTP, UGL, [place], UBIL, UNIT, UROB, [place], UEPI, UWBC, [address], [place], [place], [place], [place]  Basic Metabolic Panel:  Recent Labs Lab 09/18/15 1147 09/18/15 1805  09/19/15 0625 09/20/15 0607 09/21/15 0740 09/22/15 0440  NA 126*  --   --  134* 138 137 139  K 2.7*  --   < > 3.5 4.4 4.2 4.6  CL 94*  --   --  103 110 109 109  CO2 27  --   --  22 20* 20* 22  GLUCOSE 120*  --   --  130* 182* 131* 120*  BUN 10  --   --  7 5* 7 6  CREATININE 0.6 0.55  --  0.48 0.54 0.50 0.50  CALCIUM 8.4  --   --  7.2* 7.8* 7.8* 7.8*  MG  --    --   --  1.6* 2.1 1.9  --   PHOS  --   --   --  2.8 2.1* 2.7  --   < > = values in this interval not displayed. GFR Estimated Creatinine Clearance: 69 mL/min (by C-G formula based on Cr of 0.5). Liver Function Tests:  Recent Labs Lab 09/18/15 1147 09/19/15 0625 09/20/15 0607 09/21/15 0740 09/22/15 0440  AST 14 11* _1 ALT 7* 9* 7* 9* 11*  ALKPHOS 99* 85 74 70 68  BILITOT 0.80 0.5 0.2* 0.2* 0.4  PROT 5.3* 5.1* 5.1* 5.4* 5.2*  ALBUMIN 2.3* 2.1* 2.2* 2.5* 2.3*   No results for input(s): LIPASE, AMYLASE in the last 168 hours. No results for input(s): AMMONIA in the last 168 hours. Coagulation profile No results for input(s): INR, PROTIME in the last 168 hours.  CBC:  Recent Labs Lab 09/18/15 1146 09/18/15 1805 09/19/15 0625 09/20/15 0607 09/21/15 0740 09/22/15 0440  WBC 25.1* 18.5* 14.9* 19.5*  19.1* 16.4*  NEUTROABS 21.3*  --   --  16.8* 15.7* 14.9*  HGB 11.5* 10.1* 9.7* 9.2* 9.8* 10.0*  HCT 36.1 32.5* 31.4* 30.7* 32.6* 33.3*  MCV 79* 81.5 81.8 84.1 84.0 84.1  PLT 347 Large platelets present 332 317 355 387 400   Cardiac Enzymes: No results for input(s): CKTOTAL, CKMB, CKMBINDEX, TROPONINI in the last 168 hours. BNP: Invalid input(s): POCBNP CBG:  Recent Labs Lab 09/20/15 2129 09/21/15 0745 09/21/15 1644 09/21/15 2153 09/22/15 0725  GLUCAP 152* 109* 82 97 93   D-Dimer No results for input(s): DDIMER in the last 72 hours. Hgb A1c No results for input(s): HGBA1C in the last 72 hours. Lipid Profile  Recent Labs  09/20/15 0607  TRIG 92   Thyroid function studies No results for input(s): TSH, T4TOTAL, T3FREE, THYROIDAB in the last 72 hours.  Invalid input(s): FREET3 Anemia work up No results for input(s): VITAMINB12, FOLATE, FERRITIN, TIBC, IRON, RETICCTPCT in the last 72 hours. Microbiology Recent Results (from the past 240 hour(s))  C difficile quick scan w PCR reflex     Status: None   Collection Time: 09/19/15  6:02 AM  Result Value Ref  Range Status   C Diff antigen NEGATIVE NEGATIVE Final   C Diff toxin NEGATIVE NEGATIVE Final   C Diff interpretation Negative for toxigenic C. difficile  Final  Gastrointestinal Panel by PCR , Stool     Status: None   Collection Time: 09/19/15  4:30 PM  Result Value Ref Range Status   Campylobacter species NOT DETECTED NOT DETECTED Final   Plesimonas shigelloides NOT DETECTED NOT DETECTED Final   Salmonella species NOT DETECTED NOT DETECTED Final   Yersinia enterocolitica NOT DETECTED NOT DETECTED Final   Vibrio species NOT DETECTED NOT DETECTED Final   Vibrio cholerae NOT DETECTED NOT DETECTED Final   Enteroaggregative E coli (EAEC) NOT DETECTED NOT DETECTED Final   Enteropathogenic E coli (EPEC) NOT DETECTED NOT DETECTED Final   Enterotoxigenic E coli (ETEC) NOT DETECTED NOT DETECTED Final   Shiga like toxin producing E coli (STEC) NOT DETECTED NOT DETECTED Final   E. coli O157 NOT DETECTED NOT DETECTED Final   Shigella/Enteroinvasive E coli (EIEC) NOT DETECTED NOT DETECTED Final   Cryptosporidium NOT DETECTED NOT DETECTED Final   Cyclospora cayetanensis NOT DETECTED NOT DETECTED Final   Entamoeba histolytica NOT DETECTED NOT DETECTED Final   Giardia lamblia NOT DETECTED NOT DETECTED Final   Adenovirus F40/41 NOT DETECTED NOT DETECTED Final   Astrovirus NOT DETECTED NOT DETECTED Final   Norovirus GI/GII NOT DETECTED NOT DETECTED Final   Rotavirus A NOT DETECTED NOT DETECTED Final   Sapovirus (I, II, IV, and V) NOT DETECTED NOT DETECTED Final      Studies:  No results found.  Assessment: 55 y.o. Felicia Richmond woman with widely metastatic melanoma initially diagnosed January 2016, BRAF positive, treated initially with vemurafenib with response but early progression, then cobimetinib with progression, currnetly receiving ipilimumab and nivolumad, most recent dose 08/21/2015, admitted with refractory diarrhea, C difficile negative, complicated by weight loss, dehydration and electrolyte  abnormalities.  Plan: she had no BMs overnight, which is favorable; already 2 BMs this AM. Not getting OOB much. Felicia Richmond is improved.  I encouraged her to start spending most of the day in the recliner instead of horizontal in bed, and to ambulate in the room. I don't think she is ready for d/c yet but expect by Monday she may be able to hydrate herself sufficiently well that d/c  could be considered.   Chauncey Cruel, MD 09/22/2015  10:28 AM Medical Oncology and Hematology [facility] [address] [place], [place] [address] Tel. [phone]    Fax. [phone]

## 2015-09-23 LAB — COMPREHENSIVE METABOLIC PANEL
ALT: 12 U/L — AB (ref 14–54)
AST: 19 U/L (ref 15–41)
Albumin: 2.3 g/dL — ABNORMAL LOW (ref 3.5–5.0)
Alkaline Phosphatase: 61 U/L (ref 38–126)
Anion gap: 7 (ref 5–15)
BILIRUBIN TOTAL: 0.3 mg/dL (ref 0.3–1.2)
BUN: 10 mg/dL (ref 6–20)
CHLORIDE: 109 mmol/L (ref 101–111)
CO2: 24 mmol/L (ref 22–32)
CREATININE: 0.5 mg/dL (ref 0.44–1.00)
Calcium: 8 mg/dL — ABNORMAL LOW (ref 8.9–10.3)
GFR calc Af Amer: >60 mL/min (ref >60)
Glucose, Bld: 137 mg/dL — ABNORMAL HIGH (ref 65–99)
Potassium: 4.8 mmol/L (ref 3.5–5.1)
SODIUM: 140 mmol/L (ref 135–145)
TOTAL PROTEIN: 5 g/dL — AB (ref 6.5–8.1)

## 2015-09-23 LAB — CBC WITH DIFFERENTIAL/PLATELET
BASOS ABS: 0 10*3/uL (ref 0.0–0.1)
BASOS PCT: 0 %
EOS ABS: 0 10*3/uL (ref 0.0–0.7)
EOS PCT: 0 %
HCT: 32.6 % — ABNORMAL LOW (ref 36.0–46.0)
Hemoglobin: 9.9 g/dL — ABNORMAL LOW (ref 12.0–15.0)
LYMPHS ABS: 2.2 10*3/uL (ref 0.7–4.0)
Lymphocytes Relative: 9 %
MCH: 25.6 pg — AB (ref 26.0–34.0)
MCHC: 30.4 g/dL (ref 30.0–36.0)
MCV: 84.2 fL (ref 78.0–100.0)
Monocytes Absolute: 0.4 10*3/uL (ref 0.1–1.0)
Monocytes Relative: 2 %
Neutro Abs: 21.1 10*3/uL — ABNORMAL HIGH (ref 1.7–7.7)
Neutrophils Relative %: 89 %
PLATELETS: 398 10*3/uL (ref 150–400)
RBC: 3.87 MIL/uL (ref 3.87–5.11)
RDW: 17 % — ABNORMAL HIGH (ref 11.5–15.5)
WBC: 23.7 10*3/uL — AB (ref 4.0–10.5)

## 2015-09-23 MED ORDER — CHOLESTYRAMINE 4 G PO PACK
4.0000 g | PACK | Freq: Two times a day (BID) | ORAL | Status: DC
  Filled 2015-09-23 (×6): qty 1

## 2015-09-23 NOTE — Progress Notes (Signed)
Felicia Richmond   DOB:31-Jan-1961   SK#:[id number]   IOM#:[id number]  Subjective: vomited after lunch yesterday, then slowed down on po intake and had only broth for supper; had ice cream at 2 AM and two bites of [place] toast this AM-- no BMs overnight, one so far this AM; trying to stay OOB but feels woozy; no family in room    Objective: middle aged White woman examined in bed  Intake/Output Summary (Last 24 hours) at 09/23/15 0848 Last data filed at 09/22/15 2107  Gross per 24 hour  Intake    960 ml  Output      0 ml  Net    960 ml   Filed Vitals:   09/22/15 1355 09/22/15 2104  BP: 134/62 142/68  Pulse: 71 70  Temp: 97.5 F (36.4 C) 98.1 F (36.7 C)  Resp: 16 20  Body mass index is 27.47 kg/(m^2).     Sclerae unicteric, EOMs intact  Oropharynx clear-- no thrush noted  Lungs clear -- auscultated anterolaterally  Heart regular rate and rhythm  Abdomen +BS, NT  Neuro nonfocal, A&O x3   CBG (last 3)   Recent Labs  09/21/15 1644 09/21/15 2153 09/22/15 0725  GLUCAP 82 97 93     Labs:  Lab Results  Component Value Date   WBC 23.7* 09/23/2015   HGB 9.9* 09/23/2015   HCT 32.6* 09/23/2015   MCV 84.2 09/23/2015   PLT 398 09/23/2015   NEUTROABS 21.1* 09/23/2015    @LASTCHEMISTRY @  Urine Studies No results for input(s): UHGB, CRYS in the last 72 hours.  Invalid input(s): UACOL, UAPR, USPG, UPH, UTP, UGL, UKET, UBIL, UNIT, UROB, [place], UEPI, UWBC, Felicia Richmond, [place]  Basic Metabolic Panel:  Recent Labs Lab 09/19/15 0625 09/20/15 0607 09/21/15 0740 09/22/15 0440 09/23/15 0440  NA 134* 138 137 139 140  K 3.5 4.4 4.2 4.6 4.8  CL 103 110 109 109 109  CO2 22 20* 20* 22 24  GLUCOSE 130* 182* 131* 120* 137*  BUN 7 5* 7 6 10   CREATININE 0.48 0.54 0.50 0.50 0.50  CALCIUM 7.2* 7.8* 7.8* 7.8* 8.0*  MG 1.6* 2.1 1.9  --   --   PHOS 2.8 2.1* 2.7  --   --    GFR Estimated Creatinine Clearance: 69 mL/min (by C-G formula based on Cr of 0.5). Liver Function  Tests:  Recent Labs Lab 09/19/15 0625 09/20/15 0607 09/21/15 0740 09/22/15 0440 09/23/15 0440  AST 11* 15 15 18 19   ALT 9* 7* 9* 11* 12*  ALKPHOS 85 74 70 68 61  BILITOT 0.5 0.2* 0.2* 0.4 0.3  PROT 5.1* 5.1* 5.4* 5.2* 5.0*  ALBUMIN 2.1* 2.2* 2.5* 2.3* 2.3*   No results for input(s): LIPASE, AMYLASE in the last 168 hours. No results for input(s): AMMONIA in the last 168 hours. Coagulation profile No results for input(s): INR, PROTIME in the last 168 hours.  CBC:  Recent Labs Lab 09/18/15 1146  09/19/15 0625 09/20/15 0607 09/21/15 0740 09/22/15 0440 09/23/15 0440  WBC 25.1*  < > 14.9* 19.5* 19.1* 16.4* 23.7*  NEUTROABS 21.3*  --   --  16.8* 15.7* 14.9* 21.1*  HGB 11.5*  < > 9.7* 9.2* 9.8* 10.0* 9.9*  HCT 36.1  < > 31.4* 30.7* 32.6* 33.3* 32.6*  MCV 79*  < > 81.8 84.1 84.0 84.1 84.2  PLT 347 Large platelets present  < > 317 355 387 400 398  < > = values in this interval not displayed.  Cardiac Enzymes: No results for input(s): CKTOTAL, CKMB, CKMBINDEX, TROPONINI in the last 168 hours. BNP: Invalid input(s): POCBNP CBG:  Recent Labs Lab 09/20/15 2129 09/21/15 0745 09/21/15 1644 09/21/15 2153 09/22/15 0725  GLUCAP 152* 109* 82 97 93   D-Dimer No results for input(s): DDIMER in the last 72 hours. Hgb A1c No results for input(s): HGBA1C in the last 72 hours. Lipid Profile No results for input(s): CHOL, HDL, LDLCALC, TRIG, CHOLHDL, LDLDIRECT in the last 72 hours. Thyroid function studies No results for input(s): TSH, T4TOTAL, T3FREE, THYROIDAB in the last 72 hours.  Invalid input(s): FREET3 Anemia work up No results for input(s): VITAMINB12, FOLATE, FERRITIN, TIBC, IRON, RETICCTPCT in the last 72 hours. Microbiology Recent Results (from the past 240 hour(s))  C difficile quick scan w PCR reflex     Status: None   Collection Time: 09/19/15  6:02 AM  Result Value Ref Range Status   C Diff antigen NEGATIVE NEGATIVE Final   C Diff toxin NEGATIVE NEGATIVE  Final   C Diff interpretation Negative for toxigenic C. difficile  Final  Gastrointestinal Panel by PCR , Stool     Status: None   Collection Time: 09/19/15  4:30 PM  Result Value Ref Range Status   Campylobacter species NOT DETECTED NOT DETECTED Final   Plesimonas shigelloides NOT DETECTED NOT DETECTED Final   Salmonella species NOT DETECTED NOT DETECTED Final   Yersinia enterocolitica NOT DETECTED NOT DETECTED Final   Vibrio species NOT DETECTED NOT DETECTED Final   Vibrio cholerae NOT DETECTED NOT DETECTED Final   Enteroaggregative E coli (EAEC) NOT DETECTED NOT DETECTED Final   Enteropathogenic E coli (EPEC) NOT DETECTED NOT DETECTED Final   Enterotoxigenic E coli (ETEC) NOT DETECTED NOT DETECTED Final   Shiga like toxin producing E coli (STEC) NOT DETECTED NOT DETECTED Final   E. coli O157 NOT DETECTED NOT DETECTED Final   Shigella/Enteroinvasive E coli (EIEC) NOT DETECTED NOT DETECTED Final   Cryptosporidium NOT DETECTED NOT DETECTED Final   Cyclospora cayetanensis NOT DETECTED NOT DETECTED Final   Entamoeba histolytica NOT DETECTED NOT DETECTED Final   Giardia lamblia NOT DETECTED NOT DETECTED Final   Adenovirus F40/41 NOT DETECTED NOT DETECTED Final   Astrovirus NOT DETECTED NOT DETECTED Final   Norovirus GI/GII NOT DETECTED NOT DETECTED Final   Rotavirus A NOT DETECTED NOT DETECTED Final   Sapovirus (I, II, IV, and V) NOT DETECTED NOT DETECTED Final      Studies:  No results found.  Assessment: 55 y.o. 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  Plan:  Diarrhea seems to be slacking off; she still vomited x1  yesterday. Will add [place] today. Encouraged her to take in many small meals instead of trying to eat 3 big meals. Encouraged her to ambulate in room. Asked her to do her best to keep herself well hydrated with a view to possible discharge next 24-48 hours.  Futher anti-melanoma therapy per Dr Arville Lime, MD 09/23/2015  8:48 AM Medical Oncology and Hematology [facility] [place], [place] [address] Tel. [phone]    Fax. [phone]

## 2015-09-23 NOTE — Progress Notes (Signed)
GASTROENTEROLOGY PROGRESS NOTE  Problem:   Diarrhea. Lymphocytic colitis. 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  Subjective: 3 episodes of diarrhea yesterday, which is somewhat improved. None so far today. One episode of vomiting yesterday. Yesterday was her first day on Entocort.  Objective: White count of 23,700 noted. No fever, no toxicity. On steroids.  Assessment: I am cautiously optimistic that the combination of Entocort and Imodium will be effective. We may need to increase the Imodium dose.  Plan: 1. Would stop Solu-Medrol unless there is another reason, apart from her diarrhea, to continue it. I will defer this decision to Dr. Marin Olp. 2. Monitor stool frequency, character, and incontinence over the next 24 hours. Consider increasing standing dose of Imodium to 2 capsules, if diarrhea or incontinence persist.  Cleotis Nipper, M.D. 09/23/2015 10:50 AM  Pager [phone] If no answer or after 5 PM call [phone]:

## 2015-09-24 ENCOUNTER — Other Ambulatory Visit: Payer: Self-pay | Admitting: Hematology & Oncology

## 2015-09-24 MED ORDER — FLUCONAZOLE 100 MG PO TABS
100.0000 mg | ORAL_TABLET | Freq: Every day | ORAL | Status: DC
  Administered 2015-09-24 – 2015-09-25 (×2): 100 mg via ORAL
  Filled 2015-09-24 (×2): qty 1

## 2015-09-24 MED ORDER — FLUCONAZOLE 100 MG PO TABS: 100.0000 mg | ORAL_TABLET | Freq: Every day | ORAL | Status: DC

## 2015-09-24 MED ORDER — BUDESONIDE 3 MG PO CPEP: 9.0000 mg | ORAL_CAPSULE | Freq: Every day | ORAL | Status: DC

## 2015-09-24 NOTE — Discharge Summary (Signed)
#   [id number] is d/c summary  Laurey Arrow "E  [place] 14:14

## 2015-09-25 ENCOUNTER — Other Ambulatory Visit: Payer: Self-pay | Admitting: Hematology & Oncology

## 2015-09-25 ENCOUNTER — Other Ambulatory Visit: Payer: Self-pay

## 2015-09-25 LAB — CBC WITH DIFFERENTIAL/PLATELET
BASOS ABS: 0 10*3/uL (ref 0.0–0.1)
Basophils Relative: 0 %
Eosinophils Absolute: 0 10*3/uL (ref 0.0–0.7)
Eosinophils Relative: 0 %
HEMATOCRIT: 35.7 % — AB (ref 36.0–46.0)
Hemoglobin: 10.7 g/dL — ABNORMAL LOW (ref 12.0–15.0)
LYMPHS ABS: 4.6 10*3/uL — AB (ref 0.7–4.0)
LYMPHS PCT: 19 %
MCH: 25.5 pg — AB (ref 26.0–34.0)
MCHC: 30 g/dL (ref 30.0–36.0)
MCV: 85 fL (ref 78.0–100.0)
MONO ABS: 1.2 10*3/uL — AB (ref 0.1–1.0)
Monocytes Relative: 5 %
NEUTROS ABS: 18.5 10*3/uL — AB (ref 1.7–7.7)
Neutrophils Relative %: 76 %
Platelets: 418 10*3/uL — ABNORMAL HIGH (ref 150–400)
RBC: 4.2 MIL/uL (ref 3.87–5.11)
RDW: 18.1 % — AB (ref 11.5–15.5)
WBC: 24.3 10*3/uL — ABNORMAL HIGH (ref 4.0–10.5)

## 2015-09-25 LAB — COMPREHENSIVE METABOLIC PANEL
ALT: 18 U/L (ref 14–54)
AST: 26 U/L (ref 15–41)
Albumin: 2.3 g/dL — ABNORMAL LOW (ref 3.5–5.0)
Alkaline Phosphatase: 64 U/L (ref 38–126)
Anion gap: 5 (ref 5–15)
BILIRUBIN TOTAL: 0.4 mg/dL (ref 0.3–1.2)
BUN: 15 mg/dL (ref 6–20)
CO2: 25 mmol/L (ref 22–32)
CREATININE: 0.57 mg/dL (ref 0.44–1.00)
Calcium: 7.7 mg/dL — ABNORMAL LOW (ref 8.9–10.3)
Chloride: 106 mmol/L (ref 101–111)
Glucose, Bld: 72 mg/dL (ref 65–99)
Potassium: 4.5 mmol/L (ref 3.5–5.1)
Sodium: 136 mmol/L (ref 135–145)
TOTAL PROTEIN: 5 g/dL — AB (ref 6.5–8.1)

## 2015-09-25 LAB — LACTATE DEHYDROGENASE: LDH: 234 U/L — AB (ref 98–192)

## 2015-09-25 MED ORDER — HEPARIN SOD (PORK) LOCK FLUSH 100 UNIT/ML IV SOLN
500.0000 [IU] | INTRAVENOUS | Status: AC | PRN
  Administered 2015-09-25: 500 [IU]
  Filled 2015-09-25: qty 5

## 2015-09-25 NOTE — Progress Notes (Signed)
Felicia Richmond is feeling better. She does not have any nausea or vomiting. She had 2 bowel movements yesterday but really no obvious diarrhea. Hopefully, the Entocort is helping.  She feels she is ready to go home today.   Her labs look good. Her white count is elevated because of the steroids. Her hemoglobin is 10.7. Her potassium was 4.5. Calcium 7.7 with an albumin of 2.3.  She ate better yesterday. She ambulated more.  She like to have some home care. We will see if case manager can arrange this for her.  She should be able to come back in the office a couple days. Hopefully, we will be able to restart her therapy.  Her vital signs are stable. Blood pressure is 136/58. Temperature 98.1. Pulse is 62. Head and neck exam shows no ocular or oral lesions. She has little bit of a steroid face. There is no thrush. No adenopathy in the neck. Lungs are clear. Cardiac exam regular rate and rhythm with no murmurs, rubs or bruits. Abdomen is soft. Bowel sounds present. There is no guarding or rebound tenderness. She has no palpable liver or spleen tip. Extremity shows no clubbing, cyanosis or edema. Skin exam shows no rashes.  We will go ahead and let her go home today.  She should be able to come back to the office in a couple days for evaluation and also to restart therapy.  I appreciate the standing care that  she received from all the staff on 3 [address]. the staff did a fantastic job.  Lum Keas  [place] 14:14

## 2015-09-25 NOTE — Progress Notes (Signed)
Patient d/c home,on stable condition, no diarrhea,denies pain. D/c instructions rendered to husband, verbalized understanding.

## 2015-09-25 NOTE — Discharge Summary (Signed)
Felicia Richmond, Felicia Richmond                 ACCOUNT NO.:  [id number]  MEDICAL RECORD NO.:  [id number]  LOCATION:  [id number]                         FACILITY:  [facility]  PHYSICIAN:  Volanda Napoleon, M.D.  DATE OF BIRTH:  02-26-1961  DATE OF ADMISSION:  09/18/2015 DATE OF DISCHARGE:  09/24/2015                              DISCHARGE SUMMARY   DIAGNOSES ON DISCHARGE: 1. 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 2. Dehydration. 3. Hypokalemia secondary to dehydration. 4. Hypertension. 5. Anemia secondary to therapy. 6. Status post colonoscopy. 7. Oral candidiasis. 8. Hyperthyroidism.  CONDITION ON DISCHARGE:  Stable.  ACTIVITIES:  As tolerated.  DIET:  The patient will likely have a bland diet for the immediate future.  FOLLOWUP:  The patient will follow up with Dr. Marin Olp at the [facility] in 3-4 days.  MEDICATIONS UPON DISCHARGE: 1. Entocort EC 9 mg p.o. daily. 2. Diflucan 100 mg p.o. daily. 3. Marinol 5 mg p.o. b.i.d. 4. Pepcid 20 mg p.o. daily. 5. Imodium 4 mg p.o. post diarrhea. 6. Ativan 0.5 mg p.o. q.6 hours p.r.n. nausea and vomiting. 7. Meclizine 25 mg p.o. daily. 8. Tapazole 5 mg p.o. daily. 9. MS Contin 30 mg p.o. b.i.d. 10.MSIR 15 mg p.o. q.6 hours p.r.n. pain.  HOSPITAL COURSE:  Felicia Richmond was admitted from the office because of severe diarrhea and dehydration.  She had marked hypokalemia.  We admitted her.  We did get GI to see her.  They did do a colonoscopy on her.  The colonoscopy was done on September 20, 2015.  Biopsies of the colon were taken.  There is no indication of infectious colitis.  Her C. diff was negative.  On biopsies, the pathology report ([id number]) showed lymphocytic colitis.  She was given IV Solu-Medrol.  She was then placed on Entocort EC by Gastroenterology.  Their help is very much appreciated.  We did replace her potassium.  Her potassium was 2.7 when she was admitted.  She got potassium in her IV fluids.  On  the 29th, her potassium was up to 4.8.  She had no obvious bleeding.  She had fairly good pain control.  She had no fever.  She did have a CT of the brain when she came in.  This was negative for any obvious metastatic disease.  She was able to keep down some full liquids.  She really had marked decrease in the diarrhea.  Thankfully, we do not have to use Remicade to help with the diarrhea.  She felt like she would be able to go home on the 30th.  Her labs are pending, but again on the 29th, her labs looked okay.  Her white cell count was a little bit elevated because of the steroids.  PHYSICAL EXAMINATION:  VITAL SIGNS:  Upon discharge, her vital signs showed temperature 97.9, pulse 64, blood pressure 144/70. HEAD AND NECK:  Showed no oral thrush.  She has no adenopathy in the neck. LUNGS:  Clear. CARDIAC:  Regular rate and rhythm with no murmurs, rubs, or bruits. ABDOMEN:  Soft.  She has good bowel sounds.  There is no fluid wave. There is no palpable liver or spleen tip. EXTREMITIES:  Show no clubbing, cyanosis, or edema. NEUROLOGICAL:  Shows no focal neurological deficits.     Volanda Napoleon, M.D.     PRE/MEDQ  D:  09/24/2015  T:  09/24/2015  Job:  [id number]

## 2015-09-25 NOTE — Care Management Note (Signed)
Case Management Note  Patient Details  Name: Felicia Richmond MRN: [id number] Date of Birth: 02/13/1961  Subjective/Objective:                 55 yo admitted with Diarrhea.   Action/Plan: From home with spouse.  Expected Discharge Date:   (unknown)               Expected Discharge Plan:  [place]  In-House Referral:     Discharge planning Services  CM Consult  Post Acute Care Choice:  Home Health Choice offered to:  Patient  DME Arranged:    DME Agency:     HH Arranged:  RN, Nurse's Aide [place] Agency:  [facility]  Status of Service:  In process, will continue to follow  Medicare Important Message Given:    Date Medicare IM Given:    Medicare IM give by:    Date Additional Medicare IM Given:    Additional Medicare Important Message give by:     If discussed at [place] of Stay Meetings, dates discussed:    Additional Comments: Pt requesting HHRN and Aide to assist her with her medications and bathing at home. Pt offered choice for [facility] services and chose [facility]. [facility] rep contacted for referral. Pt states no equipment needs. No other CM needs communicated. Lynnell Catalan, RN 09/25/2015, 10:22 AM

## 2015-09-27 ENCOUNTER — Ambulatory Visit (HOSPITAL_BASED_OUTPATIENT_CLINIC_OR_DEPARTMENT_OTHER): Payer: BLUE CROSS/BLUE SHIELD | Admitting: Family

## 2015-09-27 ENCOUNTER — Ambulatory Visit (HOSPITAL_BASED_OUTPATIENT_CLINIC_OR_DEPARTMENT_OTHER): Admission: RE | Admit: 2015-09-27 | Payer: BLUE CROSS/BLUE SHIELD | Source: Ambulatory Visit

## 2015-09-27 ENCOUNTER — Encounter: Payer: Self-pay | Admitting: Family

## 2015-09-27 ENCOUNTER — Ambulatory Visit (HOSPITAL_BASED_OUTPATIENT_CLINIC_OR_DEPARTMENT_OTHER): Payer: BLUE CROSS/BLUE SHIELD

## 2015-09-27 ENCOUNTER — Other Ambulatory Visit (HOSPITAL_BASED_OUTPATIENT_CLINIC_OR_DEPARTMENT_OTHER): Payer: BLUE CROSS/BLUE SHIELD

## 2015-09-27 VITALS — BP 119/58 | HR 77 | Temp 97.6°F | Resp 16 | Ht 61.0 in | Wt 142.0 lb

## 2015-09-27 DIAGNOSIS — C799 Secondary malignant neoplasm of unspecified site: Secondary | ICD-10-CM | POA: Diagnosis not present

## 2015-09-27 DIAGNOSIS — C439 Malignant melanoma of skin, unspecified: Secondary | ICD-10-CM | POA: Diagnosis not present

## 2015-09-27 DIAGNOSIS — Z5112 Encounter for antineoplastic immunotherapy: Secondary | ICD-10-CM | POA: Diagnosis not present

## 2015-09-27 DIAGNOSIS — R609 Edema, unspecified: Secondary | ICD-10-CM | POA: Diagnosis not present

## 2015-09-27 LAB — CBC WITH DIFFERENTIAL (CANCER CENTER ONLY)
BASO#: 0 10*3/uL (ref 0.0–0.2)
BASO%: 0.1 % (ref 0.0–2.0)
EOS ABS: 0.3 10*3/uL (ref 0.0–0.5)
EOS%: 1.2 % (ref 0.0–7.0)
HCT: 38.1 % (ref 34.8–46.6)
HGB: 12.1 g/dL (ref 11.6–15.9)
LYMPH#: 3.7 10*3/uL — ABNORMAL HIGH (ref 0.9–3.3)
LYMPH%: 15.9 % (ref 14.0–48.0)
MCH: 25.5 pg — AB (ref 26.0–34.0)
MCHC: 31.8 g/dL — ABNORMAL LOW (ref 32.0–36.0)
MCV: 80 fL — AB (ref 81–101)
MONO#: 1.1 10*3/uL — AB (ref 0.1–0.9)
MONO%: 4.8 % (ref 0.0–13.0)
NEUT#: 18 10*3/uL — ABNORMAL HIGH (ref 1.5–6.5)
NEUT%: 78 % (ref 39.6–80.0)
PLATELETS: 401 10*3/uL — AB (ref 145–400)
RBC: 4.74 10*6/uL (ref 3.70–5.32)
RDW: 19.2 % — AB (ref 11.1–15.7)
WBC: 23 10*3/uL — ABNORMAL HIGH (ref 3.9–10.0)

## 2015-09-27 LAB — CMP (CANCER CENTER ONLY)
ALK PHOS: 70 U/L (ref 26–84)
ALT(SGPT): 19 U/L (ref 10–47)
AST: 26 U/L (ref 11–38)
Albumin: 2.5 g/dL — ABNORMAL LOW (ref 3.3–5.5)
BILIRUBIN TOTAL: 0.7 mg/dL (ref 0.20–1.60)
BUN: 8 mg/dL (ref 7–22)
CALCIUM: 8.2 mg/dL (ref 8.0–10.3)
CO2: 31 meq/L (ref 18–33)
CREATININE: 0.6 mg/dL (ref 0.6–1.2)
Chloride: 92 mEq/L — ABNORMAL LOW (ref 98–108)
GLUCOSE: 95 mg/dL (ref 73–118)
Potassium: 3.5 mEq/L (ref 3.3–4.7)
SODIUM: 131 meq/L (ref 128–145)
Total Protein: 5.4 g/dL — ABNORMAL LOW (ref 6.4–8.1)

## 2015-09-27 MED ORDER — HEPARIN SOD (PORK) LOCK FLUSH 100 UNIT/ML IV SOLN
500.0000 [IU] | Freq: Once | INTRAVENOUS | Status: AC | PRN
  Administered 2015-09-27: 500 [IU]
  Filled 2015-09-27: qty 5

## 2015-09-27 MED ORDER — SODIUM CHLORIDE 0.9 % IV SOLN
1.0000 mg/kg | Freq: Once | INTRAVENOUS | Status: AC
  Administered 2015-09-27: 60 mg via INTRAVENOUS
  Filled 2015-09-27: qty 6

## 2015-09-27 MED ORDER — SODIUM CHLORIDE 0.9 % IJ SOLN
10.0000 mL | INTRAMUSCULAR | Status: DC | PRN
  Administered 2015-09-27: 10 mL
  Filled 2015-09-27: qty 10

## 2015-09-27 MED ORDER — SODIUM CHLORIDE 0.9 % IV SOLN
1.0000 mg/kg | Freq: Once | INTRAVENOUS | Status: DC
  Filled 2015-09-27: qty 6

## 2015-09-27 MED ORDER — PREDNISONE 10 MG PO TABS: ORAL_TABLET | ORAL | Status: DC

## 2015-09-27 MED ORDER — SODIUM CHLORIDE 0.9 % IV SOLN
Freq: Once | INTRAVENOUS | Status: AC
  Administered 2015-09-27: 14:00:00 via INTRAVENOUS

## 2015-09-27 MED ORDER — SODIUM CHLORIDE 0.9 % IV SOLN
2.2500 mg/kg | Freq: Once | INTRAVENOUS | Status: AC
  Administered 2015-09-27: 140 mg via INTRAVENOUS
  Filled 2015-09-27: qty 28

## 2015-09-27 NOTE — Patient Instructions (Signed)
[facility] Discharge Instructions for Patients Receiving Chemotherapy  Today you received the following chemotherapy agents Rae Halsted  To help prevent nausea and vomiting after your treatment, we encourage you to take your nausea medication    If you develop nausea and vomiting that is not controlled by your nausea medication, call the clinic.   BELOW ARE SYMPTOMS THAT SHOULD BE REPORTED IMMEDIATELY:  *FEVER GREATER THAN 100.5 F  *CHILLS WITH OR WITHOUT FEVER  NAUSEA AND VOMITING THAT IS NOT CONTROLLED WITH YOUR NAUSEA MEDICATION  *UNUSUAL SHORTNESS OF BREATH  *UNUSUAL BRUISING OR BLEEDING  TENDERNESS IN MOUTH AND THROAT WITH OR WITHOUT PRESENCE OF ULCERS  *URINARY PROBLEMS  *BOWEL PROBLEMS  UNUSUAL RASH Items with * indicate a potential emergency and should be followed up as soon as possible.  Feel free to call the clinic you have any questions or concerns. The clinic phone number is [phone].  Please show the [place] at check-in to the Emergency Department and triage nurse.

## 2015-09-27 NOTE — Progress Notes (Signed)
Hematology and Oncology Follow Up Visit  Felicia Richmond [id number] 06-17-1961 55 y.o. 09/27/2015   Principle Diagnosis:  Metastatic melanoma- BRAF (+) - progressive  Current Therapy:  Pembrolizumab (58m/Kg) IV q 3 week s/p cycle 2     Interim History:  Ms. Felicia Richmond here today with her husband for follow-up. She was hospitalized last week with lymphocytic colitis. She is now back home and feeling better. Her diarrhea has improved while on Entocort. Her stools are now formed and once a day. She has been able to eat broth and pudding. She has had a few episodes of dry heaves but has not vomited. Her weight is up at 142 pounds that she has significant swelling and pitting edema in both lower extremities. Her pedal pulses are +1. She states that her legs and feet feel cold. They are warm to the touch on examination. She denies numbness or tingling in her extremities Her right breast has significantly decreased and the redness is fading. She is still tender in that area. She has had a few episodes of dizziness that resolved with meclizine. She has had no falls or syncopal episodes No fever, chills, vomiting, cough, rash, SOB, chest pain, palpitations, or changes in bladder habits.   Medications:    Medication List       This list is accurate as of: 09/27/15  3:42 PM.  Always use your most recent med list.               acetaminophen 500 MG tablet  Commonly known as:  TYLENOL  Take 1,000 mg by mouth every 6 (six) hours as needed for moderate pain or headache.     budesonide 3 MG 24 hr capsule  Commonly known as:  ENTOCORT EC  Take 3 capsules (9 mg total) by mouth daily.     dronabinol 5 MG capsule  Commonly known as:  MARINOL  Take 1 capsule (5 mg total) by mouth 2 (two) times daily before a meal.     famotidine 20 MG tablet  Commonly known as:  PEPCID  Take 20 mg by mouth daily.     fluconazole 100 MG tablet  Commonly known as:  DIFLUCAN  Take 1 tablet (100 mg total) by mouth  daily.     lidocaine-prilocaine cream  Commonly known as:  EMLA  Apply to affected area once     loperamide 2 MG tablet  Commonly known as:  IMODIUM A-D  Take 4 mg by mouth as needed for diarrhea or loose stools.     LORazepam 0.5 MG tablet  Commonly known as:  ATIVAN  Take 1 tablet (0.5 mg total) by mouth every 6 (six) hours as needed (Nausea or vomiting).     Meclizine HCl 25 MG Chew  Chew 1 tablet (25 mg total) by mouth daily as needed (dizziness).     methimazole 5 MG tablet  Commonly known as:  TAPAZOLE  Take 5 mg by mouth daily.     morphine 15 MG tablet  Commonly known as:  MSIR  Take 1 tablet (15 mg total) by mouth every 6 (six) hours as needed for severe pain.     morphine 30 MG 12 hr tablet  Commonly known as:  MS CONTIN  Take 1 tablet (30 mg total) by mouth every 12 (twelve) hours.     predniSONE 10 MG tablet  Commonly known as:  DELTASONE  Take as directed in ipilimumab ([place] instructions.        Allergies:  Allergies  Allergen Reactions  . Cotellic [Cobimetinib] Diarrhea  . Oxycodone Nausea And Vomiting  . Pseudoephedrine Other (See Comments)    Makes patient feel weird  . Scopolamine Other (See Comments)  . Tramadol     Other reaction(s): GI Upset (intolerance)    Past Medical History, Surgical history, Social history, and Family History were reviewed and updated.  Review of Systems: All other 10 point review of systems is negative.   Physical Exam:  height is _0  (1.549 m) and weight is 142 lb (64.411 kg). Her oral temperature is 97.6 F (36.4 C). Her blood pressure is 119/58 and her pulse is 77. Her respiration is 16.   Wt Readings from Last 3 Encounters:  09/27/15 142 lb (64.411 kg)  09/23/15 147 lb 9.6 oz (66.951 kg)  09/18/15 128 lb (58.06 kg)    Ocular: Sclerae unicteric, pupils equal, round and reactive to light Ear-nose-throat: Oropharynx clear, dentition fair Lymphatic: No cervical supraclavicular or axillary  adenopathy Lungs no rales or rhonchi, good excursion bilaterally Heart regular rate and rhythm, no murmur appreciated Abd soft, nontender, positive bowel sounds, no liver or spleen tip palpated on exam MSK no focal spinal tenderness, no joint edema Neuro: non-focal, well-oriented, appropriate affect Breasts: Right breast inflammation and redness has almost completely resolved. No changes with left breast.   Lab Results  Component Value Date   WBC 23.0* 09/27/2015   HGB 12.1 09/27/2015   HCT 38.1 09/27/2015   MCV 80* 09/27/2015   PLT 401* 09/27/2015   No results found for: FERRITIN, IRON, TIBC, UIBC, IRONPCTSAT Lab Results  Component Value Date   RBC 4.74 09/27/2015   No results found for: KPAFRELGTCHN, LAMBDASER, KAPLAMBRATIO No results found for: IGGSERUM, IGA, IGMSERUM No results found for: Odetta Pink, SPEI   Chemistry      Component Value Date/Time   NA 131 09/27/2015 1050   NA 136 09/25/2015 0600   NA 130* 09/11/2015 1139   K 3.5 09/27/2015 1050   K 4.5 09/25/2015 0600   K 3.4* 09/11/2015 1139   CL 92* 09/27/2015 1050   CL 106 09/25/2015 0600   CO2 31 09/27/2015 1050   CO2 25 09/25/2015 0600   CO2 22 09/11/2015 1139   BUN 8 09/27/2015 1050   BUN 15 09/25/2015 0600   BUN 7.8 09/11/2015 1139   CREATININE 0.6 09/27/2015 1050   CREATININE 0.57 09/25/2015 0600   CREATININE 0.6 09/11/2015 1139      Component Value Date/Time   CALCIUM 8.2 09/27/2015 1050   CALCIUM 7.7* 09/25/2015 0600   CALCIUM 9.1 09/11/2015 1139   ALKPHOS 70 09/27/2015 1050   ALKPHOS 64 09/25/2015 0600   ALKPHOS 75 09/11/2015 1139   AST 26 09/27/2015 1050   AST 26 09/25/2015 0600   AST 13 09/11/2015 1139   ALT 19 09/27/2015 1050   ALT 18 09/25/2015 0600   ALT <9 09/11/2015 1139   BILITOT 0.70 09/27/2015 1050   BILITOT 0.4 09/25/2015 0600   BILITOT 0.41 09/11/2015 1139     Impression and Plan: Felicia Richmond is a 55 yo white female with  metastatic melanoma.  She is now home from the hospital and feeling much better. She is no longer having diarrhea. She is eating  and her energy is slightly improved.  She has swelling and pitting edema of both legs. We will get a Doppler study of both legs tomorrow morning to assess for DVTs. We will proceed with cycle  2 of [place] as planned. She has her current appointment/treatment schedule. We will see her again in 3 weeks for follow-up. She will contact us with any questions or concerns. We can certainly see her sooner if need be.   Eliezer Bottom, NP 2/2/20173:42 PM

## 2015-09-28 ENCOUNTER — Ambulatory Visit: Payer: BLUE CROSS/BLUE SHIELD | Admitting: Hematology & Oncology

## 2015-09-28 ENCOUNTER — Other Ambulatory Visit: Payer: Self-pay | Admitting: Family

## 2015-09-28 ENCOUNTER — Ambulatory Visit: Payer: BLUE CROSS/BLUE SHIELD

## 2015-09-28 ENCOUNTER — Ambulatory Visit (HOSPITAL_BASED_OUTPATIENT_CLINIC_OR_DEPARTMENT_OTHER): Payer: BLUE CROSS/BLUE SHIELD

## 2015-09-28 ENCOUNTER — Other Ambulatory Visit: Payer: BLUE CROSS/BLUE SHIELD

## 2015-09-28 DIAGNOSIS — C799 Secondary malignant neoplasm of unspecified site: Secondary | ICD-10-CM

## 2015-09-28 DIAGNOSIS — C439 Malignant melanoma of skin, unspecified: Secondary | ICD-10-CM

## 2015-10-02 ENCOUNTER — Ambulatory Visit: Payer: BLUE CROSS/BLUE SHIELD | Admitting: Family

## 2015-10-02 ENCOUNTER — Encounter (HOSPITAL_COMMUNITY): Payer: Self-pay

## 2015-10-02 ENCOUNTER — Telehealth: Payer: Self-pay | Admitting: *Deleted

## 2015-10-02 ENCOUNTER — Ambulatory Visit: Payer: BLUE CROSS/BLUE SHIELD

## 2015-10-02 ENCOUNTER — Other Ambulatory Visit: Payer: BLUE CROSS/BLUE SHIELD

## 2015-10-02 ENCOUNTER — Inpatient Hospital Stay (HOSPITAL_COMMUNITY)
Admission: EM | Admit: 2015-10-02 | Discharge: 2015-10-22 | DRG: 371 | Disposition: A | Discharge status: Skilled Nursing Facility | Payer: BLUE CROSS/BLUE SHIELD | Attending: Internal Medicine | Admitting: Internal Medicine

## 2015-10-02 DIAGNOSIS — I959 Hypotension, unspecified: Secondary | ICD-10-CM | POA: Diagnosis present

## 2015-10-02 DIAGNOSIS — C799 Secondary malignant neoplasm of unspecified site: Secondary | ICD-10-CM | POA: Diagnosis not present

## 2015-10-02 DIAGNOSIS — R64 Cachexia: Secondary | ICD-10-CM | POA: Diagnosis present

## 2015-10-02 DIAGNOSIS — Z8673 Personal history of transient ischemic attack (TIA), and cerebral infarction without residual deficits: Secondary | ICD-10-CM | POA: Diagnosis not present

## 2015-10-02 DIAGNOSIS — E860 Dehydration: Secondary | ICD-10-CM | POA: Diagnosis present

## 2015-10-02 DIAGNOSIS — E872 Acidosis, unspecified: Secondary | ICD-10-CM | POA: Diagnosis present

## 2015-10-02 DIAGNOSIS — R599 Enlarged lymph nodes, unspecified: Secondary | ICD-10-CM | POA: Diagnosis not present

## 2015-10-02 DIAGNOSIS — J09X2 Influenza due to identified novel influenza A virus with other respiratory manifestations: Secondary | ICD-10-CM | POA: Diagnosis not present

## 2015-10-02 DIAGNOSIS — R109 Unspecified abdominal pain: Secondary | ICD-10-CM | POA: Diagnosis not present

## 2015-10-02 DIAGNOSIS — E162 Hypoglycemia, unspecified: Secondary | ICD-10-CM | POA: Diagnosis present

## 2015-10-02 DIAGNOSIS — I10 Essential (primary) hypertension: Secondary | ICD-10-CM | POA: Diagnosis present

## 2015-10-02 DIAGNOSIS — R0602 Shortness of breath: Secondary | ICD-10-CM

## 2015-10-02 DIAGNOSIS — F17210 Nicotine dependence, cigarettes, uncomplicated: Secondary | ICD-10-CM | POA: Diagnosis present

## 2015-10-02 DIAGNOSIS — E878 Other disorders of electrolyte and fluid balance, not elsewhere classified: Secondary | ICD-10-CM | POA: Diagnosis not present

## 2015-10-02 DIAGNOSIS — C439 Malignant melanoma of skin, unspecified: Secondary | ICD-10-CM

## 2015-10-02 DIAGNOSIS — R197 Diarrhea, unspecified: Secondary | ICD-10-CM | POA: Diagnosis present

## 2015-10-02 DIAGNOSIS — J101 Influenza due to other identified influenza virus with other respiratory manifestations: Secondary | ICD-10-CM | POA: Insufficient documentation

## 2015-10-02 DIAGNOSIS — R6521 Severe sepsis with septic shock: Secondary | ICD-10-CM | POA: Diagnosis not present

## 2015-10-02 DIAGNOSIS — Z79899 Other long term (current) drug therapy: Secondary | ICD-10-CM

## 2015-10-02 DIAGNOSIS — E871 Hypo-osmolality and hyponatremia: Secondary | ICD-10-CM | POA: Diagnosis present

## 2015-10-02 DIAGNOSIS — Z79891 Long term (current) use of opiate analgesic: Secondary | ICD-10-CM

## 2015-10-02 DIAGNOSIS — A4189 Other specified sepsis: Secondary | ICD-10-CM | POA: Diagnosis not present

## 2015-10-02 DIAGNOSIS — Z9049 Acquired absence of other specified parts of digestive tract: Secondary | ICD-10-CM | POA: Diagnosis not present

## 2015-10-02 DIAGNOSIS — G92 Toxic encephalopathy: Secondary | ICD-10-CM | POA: Diagnosis present

## 2015-10-02 DIAGNOSIS — Z888 Allergy status to other drugs, medicaments and biological substances status: Secondary | ICD-10-CM

## 2015-10-02 DIAGNOSIS — E440 Moderate protein-calorie malnutrition: Secondary | ICD-10-CM | POA: Diagnosis present

## 2015-10-02 DIAGNOSIS — N179 Acute kidney failure, unspecified: Secondary | ICD-10-CM | POA: Diagnosis not present

## 2015-10-02 DIAGNOSIS — R159 Full incontinence of feces: Secondary | ICD-10-CM | POA: Diagnosis present

## 2015-10-02 DIAGNOSIS — Z885 Allergy status to narcotic agent status: Secondary | ICD-10-CM

## 2015-10-02 DIAGNOSIS — R000 Tachycardia, unspecified: Secondary | ICD-10-CM | POA: Diagnosis not present

## 2015-10-02 DIAGNOSIS — D630 Anemia in neoplastic disease: Secondary | ICD-10-CM | POA: Diagnosis present

## 2015-10-02 DIAGNOSIS — Z4659 Encounter for fitting and adjustment of other gastrointestinal appliance and device: Secondary | ICD-10-CM

## 2015-10-02 DIAGNOSIS — A419 Sepsis, unspecified organism: Secondary | ICD-10-CM | POA: Diagnosis present

## 2015-10-02 DIAGNOSIS — D6481 Anemia due to antineoplastic chemotherapy: Secondary | ICD-10-CM | POA: Diagnosis present

## 2015-10-02 DIAGNOSIS — C7971 Secondary malignant neoplasm of right adrenal gland: Secondary | ICD-10-CM | POA: Diagnosis present

## 2015-10-02 DIAGNOSIS — R627 Adult failure to thrive: Secondary | ICD-10-CM | POA: Diagnosis present

## 2015-10-02 DIAGNOSIS — E038 Other specified hypothyroidism: Secondary | ICD-10-CM | POA: Diagnosis not present

## 2015-10-02 DIAGNOSIS — Z9889 Other specified postprocedural states: Secondary | ICD-10-CM

## 2015-10-02 DIAGNOSIS — H409 Unspecified glaucoma: Secondary | ICD-10-CM | POA: Diagnosis present

## 2015-10-02 DIAGNOSIS — J439 Emphysema, unspecified: Secondary | ICD-10-CM | POA: Diagnosis present

## 2015-10-02 DIAGNOSIS — J90 Pleural effusion, not elsewhere classified: Secondary | ICD-10-CM | POA: Diagnosis present

## 2015-10-02 DIAGNOSIS — E8770 Fluid overload, unspecified: Secondary | ICD-10-CM | POA: Diagnosis not present

## 2015-10-02 DIAGNOSIS — Z8249 Family history of ischemic heart disease and other diseases of the circulatory system: Secondary | ICD-10-CM

## 2015-10-02 DIAGNOSIS — R1084 Generalized abdominal pain: Secondary | ICD-10-CM | POA: Diagnosis not present

## 2015-10-02 DIAGNOSIS — D899 Disorder involving the immune mechanism, unspecified: Secondary | ICD-10-CM | POA: Diagnosis present

## 2015-10-02 DIAGNOSIS — A047 Enterocolitis due to Clostridium difficile: Secondary | ICD-10-CM | POA: Diagnosis present

## 2015-10-02 DIAGNOSIS — E876 Hypokalemia: Secondary | ICD-10-CM | POA: Diagnosis present

## 2015-10-02 DIAGNOSIS — R600 Localized edema: Secondary | ICD-10-CM

## 2015-10-02 DIAGNOSIS — G9340 Encephalopathy, unspecified: Secondary | ICD-10-CM | POA: Diagnosis not present

## 2015-10-02 DIAGNOSIS — E0590 Thyrotoxicosis, unspecified without thyrotoxic crisis or storm: Secondary | ICD-10-CM | POA: Diagnosis present

## 2015-10-02 DIAGNOSIS — B370 Candidal stomatitis: Secondary | ICD-10-CM | POA: Diagnosis not present

## 2015-10-02 DIAGNOSIS — R339 Retention of urine, unspecified: Secondary | ICD-10-CM | POA: Diagnosis not present

## 2015-10-02 DIAGNOSIS — E785 Hyperlipidemia, unspecified: Secondary | ICD-10-CM | POA: Diagnosis present

## 2015-10-02 DIAGNOSIS — K52832 Lymphocytic colitis: Secondary | ICD-10-CM | POA: Diagnosis present

## 2015-10-02 DIAGNOSIS — J9811 Atelectasis: Secondary | ICD-10-CM | POA: Diagnosis present

## 2015-10-02 DIAGNOSIS — E46 Unspecified protein-calorie malnutrition: Secondary | ICD-10-CM | POA: Diagnosis not present

## 2015-10-02 DIAGNOSIS — K591 Functional diarrhea: Secondary | ICD-10-CM

## 2015-10-02 DIAGNOSIS — C7972 Secondary malignant neoplasm of left adrenal gland: Secondary | ICD-10-CM | POA: Diagnosis present

## 2015-10-02 DIAGNOSIS — G8929 Other chronic pain: Secondary | ICD-10-CM | POA: Diagnosis present

## 2015-10-02 DIAGNOSIS — N61 Mastitis without abscess: Secondary | ICD-10-CM

## 2015-10-02 DIAGNOSIS — A0472 Enterocolitis due to Clostridium difficile, not specified as recurrent: Secondary | ICD-10-CM

## 2015-10-02 DIAGNOSIS — Z809 Family history of malignant neoplasm, unspecified: Secondary | ICD-10-CM

## 2015-10-02 DIAGNOSIS — H8109 Meniere's disease, unspecified ear: Secondary | ICD-10-CM | POA: Diagnosis present

## 2015-10-02 DIAGNOSIS — J9601 Acute respiratory failure with hypoxia: Secondary | ICD-10-CM | POA: Diagnosis not present

## 2015-10-02 DIAGNOSIS — E039 Hypothyroidism, unspecified: Secondary | ICD-10-CM | POA: Diagnosis present

## 2015-10-02 DIAGNOSIS — I313 Pericardial effusion (noninflammatory): Secondary | ICD-10-CM | POA: Diagnosis present

## 2015-10-02 DIAGNOSIS — R112 Nausea with vomiting, unspecified: Secondary | ICD-10-CM | POA: Diagnosis not present

## 2015-10-02 DIAGNOSIS — R110 Nausea: Secondary | ICD-10-CM | POA: Diagnosis not present

## 2015-10-02 DIAGNOSIS — Z66 Do not resuscitate: Secondary | ICD-10-CM | POA: Diagnosis not present

## 2015-10-02 DIAGNOSIS — I9589 Other hypotension: Secondary | ICD-10-CM | POA: Diagnosis not present

## 2015-10-02 DIAGNOSIS — R229 Localized swelling, mass and lump, unspecified: Secondary | ICD-10-CM | POA: Diagnosis not present

## 2015-10-02 DIAGNOSIS — E2740 Unspecified adrenocortical insufficiency: Secondary | ICD-10-CM | POA: Diagnosis not present

## 2015-10-02 LAB — COMPREHENSIVE METABOLIC PANEL
ALBUMIN: 2.3 g/dL — AB (ref 3.5–5.0)
ALK PHOS: 60 U/L (ref 38–126)
ALT: 9 U/L — AB (ref 14–54)
AST: 20 U/L (ref 15–41)
Anion gap: 12 (ref 5–15)
BILIRUBIN TOTAL: 0.8 mg/dL (ref 0.3–1.2)
BUN: 10 mg/dL (ref 6–20)
CALCIUM: 7.4 mg/dL — AB (ref 8.9–10.3)
CO2: 23 mmol/L (ref 22–32)
CREATININE: 0.61 mg/dL (ref 0.44–1.00)
Chloride: 94 mmol/L — ABNORMAL LOW (ref 101–111)
GFR calc Af Amer: >60 mL/min (ref >60)
GFR calc non Af Amer: >60 mL/min (ref >60)
GLUCOSE: 76 mg/dL (ref 65–99)
Potassium: 2.9 mmol/L — ABNORMAL LOW (ref 3.5–5.1)
Sodium: 129 mmol/L — ABNORMAL LOW (ref 135–145)
TOTAL PROTEIN: 5 g/dL — AB (ref 6.5–8.1)

## 2015-10-02 LAB — CBC
HCT: 36.3 % (ref 36.0–46.0)
HEMOGLOBIN: 11.5 g/dL — AB (ref 12.0–15.0)
MCH: 25.2 pg — ABNORMAL LOW (ref 26.0–34.0)
MCHC: 31.7 g/dL (ref 30.0–36.0)
MCV: 79.6 fL (ref 78.0–100.0)
PLATELETS: 262 10*3/uL (ref 150–400)
RBC: 4.56 MIL/uL (ref 3.87–5.11)
RDW: 19.1 % — AB (ref 11.5–15.5)
WBC: 15.9 10*3/uL — ABNORMAL HIGH (ref 4.0–10.5)

## 2015-10-02 LAB — LIPASE, BLOOD: LIPASE: 19 U/L (ref 11–51)

## 2015-10-02 LAB — GLUCOSE, CAPILLARY: Glucose-Capillary: 61 mg/dL — ABNORMAL LOW (ref 65–99)

## 2015-10-02 LAB — I-STAT CG4 LACTIC ACID, ED: LACTIC ACID, VENOUS: 1.67 mmol/L (ref 0.5–2.0)

## 2015-10-02 MED ORDER — SODIUM CHLORIDE 0.9 % IV SOLN: INTRAVENOUS | Status: DC

## 2015-10-02 MED ORDER — SODIUM CHLORIDE 0.9 % IV BOLUS (SEPSIS)
2000.0000 mL | Freq: Once | INTRAVENOUS | Status: AC
  Administered 2015-10-02: 2000 mL via INTRAVENOUS

## 2015-10-02 MED ORDER — LORAZEPAM 2 MG/ML IJ SOLN
0.5000 mg | Freq: Once | INTRAMUSCULAR | Status: AC
  Administered 2015-10-02: 0.5 mg via INTRAVENOUS
  Filled 2015-10-02: qty 1

## 2015-10-02 MED ORDER — DRONABINOL 2.5 MG PO CAPS
2.5000 mg | ORAL_CAPSULE | Freq: Two times a day (BID) | ORAL | Status: DC
  Administered 2015-10-03 – 2015-10-07 (×9): 2.5 mg via ORAL
  Filled 2015-10-02 (×9): qty 1

## 2015-10-02 MED ORDER — FAMOTIDINE 20 MG PO TABS
20.0000 mg | ORAL_TABLET | Freq: Every day | ORAL | Status: DC
  Administered 2015-10-03 – 2015-10-04 (×2): 20 mg via ORAL
  Filled 2015-10-02 (×2): qty 1

## 2015-10-02 MED ORDER — METOCLOPRAMIDE HCL 5 MG/ML IJ SOLN
5.0000 mg | Freq: Once | INTRAMUSCULAR | Status: AC
  Administered 2015-10-02: 5 mg via INTRAVENOUS
  Filled 2015-10-02: qty 2

## 2015-10-02 MED ORDER — BUDESONIDE 3 MG PO CPEP
9.0000 mg | ORAL_CAPSULE | Freq: Every day | ORAL | Status: DC
  Filled 2015-10-02: qty 3

## 2015-10-02 MED ORDER — ACETAMINOPHEN 650 MG RE SUPP: 650.0000 mg | Freq: Four times a day (QID) | RECTAL | Status: DC | PRN

## 2015-10-02 MED ORDER — ONDANSETRON HCL 4 MG PO TABS: 4.0000 mg | ORAL_TABLET | Freq: Four times a day (QID) | ORAL | Status: DC | PRN

## 2015-10-02 MED ORDER — ONDANSETRON HCL 4 MG/2ML IJ SOLN
4.0000 mg | Freq: Four times a day (QID) | INTRAMUSCULAR | Status: DC | PRN
  Administered 2015-10-03 – 2015-10-05 (×4): 4 mg via INTRAVENOUS
  Filled 2015-10-02 (×5): qty 2

## 2015-10-02 MED ORDER — MORPHINE SULFATE 15 MG PO TABS: 15.0000 mg | ORAL_TABLET | Freq: Four times a day (QID) | ORAL | Status: DC | PRN

## 2015-10-02 MED ORDER — FLUCONAZOLE 100 MG PO TABS
100.0000 mg | ORAL_TABLET | Freq: Every day | ORAL | Status: DC
  Administered 2015-10-03 – 2015-10-09 (×7): 100 mg via ORAL
  Filled 2015-10-02 (×7): qty 1

## 2015-10-02 MED ORDER — ACETAMINOPHEN 325 MG PO TABS: 650.0000 mg | ORAL_TABLET | Freq: Four times a day (QID) | ORAL | Status: DC | PRN

## 2015-10-02 MED ORDER — LORAZEPAM 0.5 MG PO TABS: 0.5000 mg | ORAL_TABLET | Freq: Four times a day (QID) | ORAL | Status: DC | PRN

## 2015-10-02 MED ORDER — MECLIZINE HCL 25 MG PO TABS
25.0000 mg | ORAL_TABLET | Freq: Every day | ORAL | Status: DC | PRN
  Filled 2015-10-02: qty 1

## 2015-10-02 MED ORDER — POTASSIUM CHLORIDE 10 MEQ/100ML IV SOLN
10.0000 meq | INTRAVENOUS | Status: AC
  Administered 2015-10-03 (×5): 10 meq via INTRAVENOUS
  Filled 2015-10-02 (×5): qty 100

## 2015-10-02 MED ORDER — MORPHINE SULFATE ER 30 MG PO TBCR
30.0000 mg | EXTENDED_RELEASE_TABLET | Freq: Two times a day (BID) | ORAL | Status: DC
  Administered 2015-10-03 – 2015-10-10 (×17): 30 mg via ORAL
  Filled 2015-10-02 (×17): qty 1

## 2015-10-02 MED ORDER — DIPHENHYDRAMINE HCL 50 MG/ML IJ SOLN
12.5000 mg | Freq: Once | INTRAMUSCULAR | Status: AC
  Administered 2015-10-02: 12.5 mg via INTRAVENOUS
  Filled 2015-10-02: qty 1

## 2015-10-02 MED ORDER — METHIMAZOLE 5 MG PO TABS
5.0000 mg | ORAL_TABLET | Freq: Every day | ORAL | Status: DC
  Administered 2015-10-03 – 2015-10-11 (×9): 5 mg via ORAL
  Filled 2015-10-02 (×11): qty 1

## 2015-10-02 MED ORDER — SODIUM CHLORIDE 0.9 % IV BOLUS (SEPSIS): 1000.0000 mL | Freq: Once | INTRAVENOUS | Status: DC

## 2015-10-02 MED ORDER — POTASSIUM CHLORIDE 10 MEQ/100ML IV SOLN
10.0000 meq | Freq: Once | INTRAVENOUS | Status: AC
  Administered 2015-10-02: 10 meq via INTRAVENOUS
  Filled 2015-10-02: qty 100

## 2015-10-02 MED ORDER — SODIUM CHLORIDE 0.9 % IV SOLN
INTRAVENOUS | Status: DC
  Administered 2015-10-02: 20:00:00 via INTRAVENOUS

## 2015-10-02 NOTE — ED Notes (Signed)
Attempted to place IV-patient states we may activate port after she applies lidocaine cream

## 2015-10-02 NOTE — ED Notes (Signed)
PATIENT HAS A PORT THEY ARE GOING TO ACCESS

## 2015-10-02 NOTE — Telephone Encounter (Signed)
Patient c/o diarrhea which is non-stop and just "won't stop running out of me". She is taking all her anti-diarrheals  as prescribed. Spoke to Dr Marin Olp who wants patient to come into the office and get IV fluids.  Informed patient of this plan and she doesn't think she can come in. The diarrhea is too bad. She wants to go to the hospital. She is going to go to the ED at [facility] hospital. Dr Marin Olp notified.

## 2015-10-02 NOTE — ED Notes (Signed)
Per EMS, pt from home.  Pt c/o n/v/d since Saturday.  Pt here on 2/2 and d/c with colitis.  Pt getting better but worsened on Saturday.  Home health aide told her to come to ED.  Zofran given in route.  No emesis in route.  Vitals: 133/77, hr 126, 95% ra, cbg 88

## 2015-10-02 NOTE — Telephone Encounter (Signed)
Received a call from [place] who stated she was out to the patient house today.  Patient fell in the bathroom, no injuries reported.  Patient still with N,V, Diarrhea, temp 100.8.  Patient advised to go to the ED.  Patient and husband still deciding whether to go.

## 2015-10-02 NOTE — ED Provider Notes (Signed)
CSN: [id number]     Arrival date & time 10/02/15  1542 History   First MD Initiated Contact with Patient 10/02/15 1701     Chief Complaint  Patient presents with  . Emesis  . Diarrhea     (Consider location/radiation/quality/duration/timing/severity/associated sxs/prior Treatment) HPI Comments: Patient here complaining of several days of watery diarrhea as well as nonbilious vomiting with some associated abdominal cramping. Has had a temperature at home up to 100.8. Denies any cough or congestion. She has not been dyspneic. Has noted some abdominal cramping but denies any localizing abdominal pain. Denies any urinary symptoms. No rashes or neck pain. She is currently receiving therapy for treatment of skin cancer. Seen in the department 5 days ago and diagnosed with colitis. Was initially getting better but now worse. Was sent here at the urging of her home health aide. EMS found the patient to have emesis as well as tachycardia and gave the patient Zofran and IV fluids  Patient is a 55 y.o. female presenting with vomiting and diarrhea. The history is provided by the patient.  Emesis Associated symptoms: diarrhea   Diarrhea Associated symptoms: vomiting     Past Medical History  Diagnosis Date  . Thyroid disease   . Cancer ([place]) 08/25/14    metastatic melanoma; stage IV  . Emphysema of lung ([place])   . Glaucoma   . Stroke ([place])   . Meniere's disease   . Splenic infarct   . Hyperlipidemia 04/26/2015  . Hypertension   . PONV (postoperative nausea and vomiting)    Past Surgical History  Procedure Laterality Date  . Cholecystectomy  1992  . Colonoscopy N/A 09/20/2015    Procedure: COLONOSCOPY;  Surgeon: Ronald Lobo, MD;  Location: [facility];  Service: Endoscopy;  Laterality: N/A;   Family History  Problem Relation Age of Onset  . Thyroid disease Mother   . Thyroid disease Sister   . Cancer Sister   . Heart attack Brother    Social History  Substance Use Topics  . Smoking  status: Current Every Day Smoker -- 1.00 packs/day for 36 years    Types: Cigarettes  . Smokeless tobacco: Never Used  . Alcohol Use: No   OB History    No data available     Review of Systems  Gastrointestinal: Positive for vomiting and diarrhea.  All other systems reviewed and are negative.     Allergies  Cotellic; Oxycodone; Pseudoephedrine; Scopolamine; and Tramadol  Home Medications   Prior to Admission medications   Medication Sig Start Date End Date Taking? Authorizing Provider  acetaminophen (TYLENOL) 500 MG tablet Take 1,000 mg by mouth every 6 (six) hours as needed for moderate pain or headache.   Yes Historical Provider, MD  budesonide (ENTOCORT EC) 3 MG 24 hr capsule Take 3 capsules (9 mg total) by mouth daily. 09/24/15  Yes Volanda Napoleon, MD  dronabinol (MARINOL) 5 MG capsule Take 1 capsule (5 mg total) by mouth 2 (two) times daily before a meal. 06/15/15  Yes Volanda Napoleon, MD  famotidine (PEPCID) 20 MG tablet Take 20 mg by mouth daily.   Yes Historical Provider, MD  fluconazole (DIFLUCAN) 100 MG tablet Take 1 tablet (100 mg total) by mouth daily. 09/24/15  Yes Volanda Napoleon, MD  lidocaine-prilocaine (EMLA) cream Apply to affected area once 08/21/15  Yes Volanda Napoleon, MD  loperamide (IMODIUM A-D) 2 MG tablet Take 4 mg by mouth as needed for diarrhea or loose stools.   Yes Historical  Provider, MD  LORazepam (ATIVAN) 0.5 MG tablet Take 1 tablet (0.5 mg total) by mouth every 6 (six) hours as needed (Nausea or vomiting). 09/03/15  Yes Volanda Napoleon, MD  Meclizine HCl 25 MG CHEW Chew 1 tablet (25 mg total) by mouth daily as needed (dizziness). 08/14/15  Yes Eliezer Bottom, NP  methimazole (TAPAZOLE) 5 MG tablet Take 5 mg by mouth daily.  09/01/14  Yes Historical Provider, MD  morphine (MS CONTIN) 30 MG 12 hr tablet Take 1 tablet (30 mg total) by mouth every 12 (twelve) hours. 09/12/15  Yes Volanda Napoleon, MD  morphine (MSIR) 15 MG tablet Take 1 tablet (15 mg total)  by mouth every 6 (six) hours as needed for severe pain. 09/12/15  Yes Volanda Napoleon, MD  predniSONE (DELTASONE) 10 MG tablet Take as directed in ipilimumab Curt Bears) instructions. 09/27/15   Volanda Napoleon, MD   BP 136/82 mmHg  Pulse 119  Temp(Src) 98.2 F (36.8 C) (Oral)  Resp 20  SpO2 95%  LMP 08/26/2007 Physical Exam  Constitutional: She is oriented to person, place, and time. She appears well-developed and well-nourished.  Non-toxic appearance. No distress.  HENT:  Head: Normocephalic and atraumatic.  Eyes: Conjunctivae, EOM and lids are normal. Pupils are equal, round, and reactive to light.  Neck: Normal range of motion. Neck supple. No tracheal deviation present. No thyroid mass present.  Cardiovascular: Regular rhythm and normal heart sounds.  Tachycardia present.  Exam reveals no gallop.   No murmur heard. Pulmonary/Chest: Effort normal and breath sounds normal. No stridor. No respiratory distress. She has no decreased breath sounds. She has no wheezes. She has no rhonchi. She has no rales.  Abdominal: Soft. Normal appearance and bowel sounds are normal. She exhibits no distension. There is no tenderness. There is no rebound and no CVA tenderness.  Musculoskeletal: Normal range of motion. She exhibits no edema or tenderness.  Neurological: She is alert and oriented to person, place, and time. She has normal strength. No cranial nerve deficit or sensory deficit. GCS eye subscore is 4. GCS verbal subscore is 5. GCS motor subscore is 6.  Skin: Skin is warm and dry. No abrasion and no rash noted.  Psychiatric: She has a normal mood and affect. Her speech is normal and behavior is normal.  Nursing note and vitals reviewed.   ED Course  Procedures (including critical care time) Labs Review Labs Reviewed  GASTROINTESTINAL PANEL BY PCR, STOOL (REPLACES STOOL CULTURE)  LIPASE, BLOOD  COMPREHENSIVE METABOLIC PANEL  CBC  URINALYSIS, ROUTINE W REFLEX MICROSCOPIC (NOT AT [facility])  I-STAT  CG4 LACTIC ACID, ED    Imaging Review No results found. I have personally reviewed and evaluated these images and lab results as part of my medical decision-making.   EKG Interpretation None      MDM   Final diagnoses:  None    Patient with persistent diarrhea here. Recently treated for lymphocytic colitis. Stool culture as well as C. difficile PCR ordered. Complains of feeling diffusely weak and will be admitted to the hospitalist    Lacretia Leigh, MD 10/02/15 [phone]

## 2015-10-02 NOTE — Progress Notes (Signed)
Hypoglycemic Event  CBG: 61  Treatment: D50 IV 25 mL  Symptoms: None  Follow-up CBG: Time:0222 CBG Result:90  Possible Reasons for Event: diarrhea, poor appetite and PO intake  Comments/MD notified: MD notfied, new orders given     Felicia Richmond

## 2015-10-02 NOTE — H&P (Signed)
[facility] History and Physical  Felicia Richmond [id number] DOB: 1961/06/07 DOA: 10/02/2015  Referring physician: Dr.ALLEN. PCP: Nance Pear., NP  Specialists: Dr.Enever. Oncologist.  Chief Complaint: Diarrhea.  HPI: Felicia Richmond is a 55 y.o. female with history of metastatic melanoma who was recently admitted 2 weeks ago for diarrhea and colonoscopy showed lymphocytic colitis and is on Entocort present to the ER because of recurrence of diarrhea. Patient states over the last 3 days patient is having multiple episodes of diarrhea denies any nausea vomiting but at this time patient is also having abdominal pain diffusely. Denies any antibiotics intake except for Diflucan for oral candidiasis. Denies any fever chills chest pain shortness of breath. Patient has been admitted for further management of dehydration diarrhea and abdominal pain. Patient is also found to be hypokalemic.   Review of Systems: As presented in the history of presenting illness, rest negative.  Past Medical History  Diagnosis Date  . Thyroid disease   . Cancer ([place]) 08/25/14    metastatic melanoma; stage IV  . Emphysema of lung ([place])   . Glaucoma   . Stroke ([place])   . Meniere's disease   . Splenic infarct   . Hyperlipidemia 04/26/2015  . Hypertension   . PONV (postoperative nausea and vomiting)    Past Surgical History  Procedure Laterality Date  . Cholecystectomy  1992  . Colonoscopy N/A 09/20/2015    Procedure: COLONOSCOPY;  Surgeon: Ronald Lobo, MD;  [facility];  Service: Endoscopy;  Laterality: N/A;   Social History:  reports that she has been smoking Cigarettes.  She has a 36 pack-year smoking history. She has never used smokeless tobacco. She reports that she does not drink alcohol or use illicit drugs. Where does patient live home. Can patient participate in ADLs? Yes.  Allergies  Allergen Reactions  . Cotellic [Cobimetinib] Diarrhea  . Oxycodone Nausea And Vomiting  .  Pseudoephedrine Other (See Comments)    Makes patient feel weird  . Scopolamine Other (See Comments)  . Tramadol     Other reaction(s): GI Upset (intolerance)    Family History:  Family History  Problem Relation Age of Onset  . Thyroid disease Mother   . Thyroid disease Sister   . Cancer Sister   . Heart attack Brother       Prior to Admission medications   Medication Sig Start Date End Date Taking? Authorizing Provider  acetaminophen (TYLENOL) 500 MG tablet Take 1,000 mg by mouth every 6 (six) hours as needed for moderate pain or headache.   Yes Historical Provider, MD  budesonide (ENTOCORT EC) 3 MG 24 hr capsule Take 3 capsules (9 mg total) by mouth daily. 09/24/15  Yes Volanda Napoleon, MD  dronabinol (MARINOL) 5 MG capsule Take 1 capsule (5 mg total) by mouth 2 (two) times daily before a meal. 06/15/15  Yes Volanda Napoleon, MD  famotidine (PEPCID) 20 MG tablet Take 20 mg by mouth daily.   Yes Historical Provider, MD  fluconazole (DIFLUCAN) 100 MG tablet Take 1 tablet (100 mg total) by mouth daily. 09/24/15  Yes Volanda Napoleon, MD  Ipilimumab (YERVOY IV) Inject into the vein once.   Yes Historical Provider, MD  lidocaine-prilocaine (EMLA) cream Apply to affected area once 08/21/15  Yes Volanda Napoleon, MD  loperamide (IMODIUM A-D) 2 MG tablet Take 4 mg by mouth as needed for diarrhea or loose stools.   Yes Historical Provider, MD  LORazepam (ATIVAN) 0.5 MG tablet Take 1 tablet (0.5 mg  total) by mouth every 6 (six) hours as needed (Nausea or vomiting). 09/03/15  Yes Volanda Napoleon, MD  Meclizine HCl 25 MG CHEW Chew 1 tablet (25 mg total) by mouth daily as needed (dizziness). 08/14/15  Yes Eliezer Bottom, NP  methimazole (TAPAZOLE) 5 MG tablet Take 5 mg by mouth daily.  09/01/14  Yes Historical Provider, MD  morphine (MS CONTIN) 30 MG 12 hr tablet Take 1 tablet (30 mg total) by mouth every 12 (twelve) hours. 09/12/15  Yes Volanda Napoleon, MD  morphine (MSIR) 15 MG tablet Take 1 tablet  (15 mg total) by mouth every 6 (six) hours as needed for severe pain. 09/12/15  Yes Volanda Napoleon, MD  predniSONE (DELTASONE) 10 MG tablet Take as directed in ipilimumab Curt Bears) instructions. Patient taking differently: Take as directed in ipilimumab Curt Bears) instructions. Begin if needed for Yervoy reaction. 09/27/15   Volanda Napoleon, MD    Physical Exam: Filed Vitals:   10/02/15 1548 10/02/15 2019 10/02/15 2104  BP: 136/82 127/64 141/55  Pulse: 119 110 115  Temp: 98.2 F (36.8 C)  97.5 F (36.4 C)  TempSrc: Oral  Oral  Resp: 20 26 22   Height:   5\' 1"  (1.549 m)  Weight:   64.3 kg (141 lb 12.1 oz)  SpO2: 95% 90% 99%     General:  Moderately built and nourished.  Eyes: Anicteric no pallor.  ENT: No discharge from the ears eyes nose or mouth.  Neck: No mass felt.  Cardiovascular: S1-S2 heard.  Respiratory: No rhonchi or crepitations.  Abdomen: Soft diffuse tenderness present no guarding or rigidity.  Skin: No rash.  Musculoskeletal: No edema.  Psychiatric: Appears normal.  Neurologic: Alert awake oriented to time place and person. Moves all extremities.  Labs on Admission:  Basic Metabolic Panel:  Recent Labs Lab 09/27/15 1050 10/02/15 1707  NA 131 129*  K 3.5 2.9*  CL 92* 94*  CO2 31 23  GLUCOSE 95 76  BUN 8 10  CREATININE 0.6 0.61  CALCIUM 8.2 7.4*   Liver Function Tests:  Recent Labs Lab 09/27/15 1050 10/02/15 1707  AST 26 20  ALT 19 9*  ALKPHOS 70 60  BILITOT 0.70 0.8  PROT 5.4* 5.0*  ALBUMIN 2.5* 2.3*    Recent Labs Lab 10/02/15 1707  LIPASE 19   No results for input(s): AMMONIA in the last 168 hours. CBC:  Recent Labs Lab 09/27/15 1049 10/02/15 1707  WBC 23.0* 15.9*  NEUTROABS 18.0*  --   HGB 12.1 11.5*  HCT 38.1 36.3  MCV 80* 79.6  PLT 401* 262   Cardiac Enzymes: No results for input(s): CKTOTAL, CKMB, CKMBINDEX, TROPONINI in the last 168 hours.  BNP (last 3 results) No results for input(s): BNP in the last 8760  hours.  ProBNP (last 3 results) No results for input(s): PROBNP in the last 8760 hours.  CBG: No results for input(s): GLUCAP in the last 168 hours.  Radiological Exams on Admission: No results found.  Assessment/Plan Active Problems:   Hyperthyroidism   Metastatic melanoma ([place])   Diarrhea   Hypokalemia   Abdominal pain   1. Diarrhea - most likely from lymphocytic colitis. Patient is on Entocort which she will continue for now. Check stool studies. GI pathogen panel and C. difficile PCR. Consult patient's gastroenterologist Dr. Cristina Gong in a.m. 2. Abdominal pain - this patient is having diffuse abdominal pain and mildly tender on exam I have ordered CT abdomen. For now patient will be nothing  by mouth. 3. Hypokalemia and hyponatremia probably from dehydration - replace potassium and recheck. Check magnesium levels. Continue with hydration and follow metabolic panel. 4. Hypothyroidism on methimazole. 5. Metastatic melanoma - per oncology.   DVT Prophylaxis SCDs. Code Status: Full code.  Family Communication: Discussed with patient.  Disposition Plan: Admit to inpatient.    Kerrianne Jeng N. [facility] Pager [phone].  If 7PM-7AM, please contact night-coverage [email or web address] Password [email or web address] 10/02/2015, 11:13 PM

## 2015-10-03 ENCOUNTER — Ambulatory Visit: Payer: BLUE CROSS/BLUE SHIELD | Admitting: Family

## 2015-10-03 ENCOUNTER — Other Ambulatory Visit: Payer: BLUE CROSS/BLUE SHIELD

## 2015-10-03 ENCOUNTER — Ambulatory Visit: Payer: BLUE CROSS/BLUE SHIELD

## 2015-10-03 ENCOUNTER — Inpatient Hospital Stay (HOSPITAL_COMMUNITY): Payer: BLUE CROSS/BLUE SHIELD

## 2015-10-03 DIAGNOSIS — E440 Moderate protein-calorie malnutrition: Secondary | ICD-10-CM | POA: Diagnosis present

## 2015-10-03 DIAGNOSIS — C799 Secondary malignant neoplasm of unspecified site: Secondary | ICD-10-CM

## 2015-10-03 DIAGNOSIS — R109 Unspecified abdominal pain: Secondary | ICD-10-CM

## 2015-10-03 DIAGNOSIS — E876 Hypokalemia: Secondary | ICD-10-CM

## 2015-10-03 DIAGNOSIS — R197 Diarrhea, unspecified: Secondary | ICD-10-CM

## 2015-10-03 LAB — GASTROINTESTINAL PANEL BY PCR, STOOL (REPLACES STOOL CULTURE)
ASTROVIRUS: NOT DETECTED
Adenovirus F40/41: NOT DETECTED
CRYPTOSPORIDIUM: NOT DETECTED
CYCLOSPORA CAYETANENSIS: NOT DETECTED
Campylobacter species: NOT DETECTED
E. COLI O157: NOT DETECTED
ENTAMOEBA HISTOLYTICA: NOT DETECTED
ENTEROAGGREGATIVE E COLI (EAEC): NOT DETECTED
ENTEROTOXIGENIC E COLI (ETEC): NOT DETECTED
Enteropathogenic E coli (EPEC): NOT DETECTED
GIARDIA LAMBLIA: NOT DETECTED
NOROVIRUS GI/GII: NOT DETECTED
PLESIMONAS SHIGELLOIDES: NOT DETECTED
Rotavirus A: NOT DETECTED
SALMONELLA SPECIES: NOT DETECTED
SAPOVIRUS (I, II, IV, AND V): NOT DETECTED
SHIGELLA/ENTEROINVASIVE E COLI (EIEC): NOT DETECTED
Shiga like toxin producing E coli (STEC): NOT DETECTED
VIBRIO CHOLERAE: NOT DETECTED
Vibrio species: NOT DETECTED
Yersinia enterocolitica: NOT DETECTED

## 2015-10-03 LAB — URINALYSIS, ROUTINE W REFLEX MICROSCOPIC
Bilirubin Urine: NEGATIVE
GLUCOSE, UA: NEGATIVE mg/dL
Hgb urine dipstick: NEGATIVE
Ketones, ur: NEGATIVE mg/dL
Leukocytes, UA: NEGATIVE
Nitrite: NEGATIVE
PH: 6.5 (ref 5.0–8.0)
PROTEIN: NEGATIVE mg/dL

## 2015-10-03 LAB — COMPREHENSIVE METABOLIC PANEL
ALT: 7 U/L — AB (ref 14–54)
AST: 18 U/L (ref 15–41)
Albumin: 2 g/dL — ABNORMAL LOW (ref 3.5–5.0)
Alkaline Phosphatase: 56 U/L (ref 38–126)
Anion gap: 8 (ref 5–15)
BILIRUBIN TOTAL: 0.6 mg/dL (ref 0.3–1.2)
BUN: 7 mg/dL (ref 6–20)
CHLORIDE: 100 mmol/L — AB (ref 101–111)
CO2: 22 mmol/L (ref 22–32)
CREATININE: 0.59 mg/dL (ref 0.44–1.00)
Calcium: 6.6 mg/dL — ABNORMAL LOW (ref 8.9–10.3)
Glucose, Bld: 91 mg/dL (ref 65–99)
Potassium: 3.4 mmol/L — ABNORMAL LOW (ref 3.5–5.1)
Sodium: 130 mmol/L — ABNORMAL LOW (ref 135–145)
TOTAL PROTEIN: 4.3 g/dL — AB (ref 6.5–8.1)

## 2015-10-03 LAB — CBC WITH DIFFERENTIAL/PLATELET
BASOS ABS: 0 10*3/uL (ref 0.0–0.1)
BASOS PCT: 0 %
EOS ABS: 0.2 10*3/uL (ref 0.0–0.7)
EOS PCT: 1 %
HCT: 34.2 % — ABNORMAL LOW (ref 36.0–46.0)
HEMOGLOBIN: 10.7 g/dL — AB (ref 12.0–15.0)
Lymphocytes Relative: 17 %
Lymphs Abs: 2.3 10*3/uL (ref 0.7–4.0)
MCH: 25.7 pg — ABNORMAL LOW (ref 26.0–34.0)
MCHC: 31.3 g/dL (ref 30.0–36.0)
MCV: 82.2 fL (ref 78.0–100.0)
Monocytes Absolute: 1 10*3/uL (ref 0.1–1.0)
Monocytes Relative: 8 %
NEUTROS PCT: 74 %
Neutro Abs: 9.7 10*3/uL — ABNORMAL HIGH (ref 1.7–7.7)
PLATELETS: 247 10*3/uL (ref 150–400)
RBC: 4.16 MIL/uL (ref 3.87–5.11)
RDW: 19.4 % — ABNORMAL HIGH (ref 11.5–15.5)
WBC: 13.2 10*3/uL — AB (ref 4.0–10.5)

## 2015-10-03 LAB — C DIFFICILE QUICK SCREEN W PCR REFLEX
C DIFFICILE (CDIFF) INTERP: POSITIVE
C DIFFICILE (CDIFF) TOXIN: POSITIVE — AB
C Diff antigen: POSITIVE — AB

## 2015-10-03 LAB — GLUCOSE, CAPILLARY
GLUCOSE-CAPILLARY: 90 mg/dL (ref 65–99)
GLUCOSE-CAPILLARY: 90 mg/dL (ref 65–99)
Glucose-Capillary: 86 mg/dL (ref 65–99)

## 2015-10-03 LAB — MAGNESIUM: MAGNESIUM: 1.7 mg/dL (ref 1.7–2.4)

## 2015-10-03 LAB — MRSA PCR SCREENING: MRSA by PCR: NEGATIVE

## 2015-10-03 MED ORDER — IOHEXOL 300 MG/ML  SOLN
25.0000 mL | INTRAMUSCULAR | Status: DC
Start: 2015-10-03 — End: 2015-10-03
  Administered 2015-10-03: 25 mL via ORAL

## 2015-10-03 MED ORDER — IOHEXOL 300 MG/ML  SOLN
100.0000 mL | Freq: Once | INTRAMUSCULAR | Status: AC | PRN
  Administered 2015-10-03: 100 mL via INTRAVENOUS

## 2015-10-03 MED ORDER — PROMETHAZINE HCL 25 MG/ML IJ SOLN
12.5000 mg | Freq: Four times a day (QID) | INTRAMUSCULAR | Status: DC | PRN
  Administered 2015-10-03 – 2015-10-05 (×3): 12.5 mg via INTRAVENOUS
  Filled 2015-10-03 (×3): qty 1

## 2015-10-03 MED ORDER — BUDESONIDE 3 MG PO CPEP
6.0000 mg | ORAL_CAPSULE | Freq: Every day | ORAL | Status: DC
  Administered 2015-10-03 – 2015-10-04 (×2): 6 mg via ORAL
  Filled 2015-10-03 (×3): qty 2

## 2015-10-03 MED ORDER — METHYLPREDNISOLONE SODIUM SUCC 125 MG IJ SOLR
125.0000 mg | Freq: Every day | INTRAMUSCULAR | Status: DC
  Filled 2015-10-03: qty 2

## 2015-10-03 MED ORDER — MAGNESIUM SULFATE 2 GM/50ML IV SOLN
2.0000 g | Freq: Once | INTRAVENOUS | Status: AC
  Administered 2015-10-03: 2 g via INTRAVENOUS
  Filled 2015-10-03: qty 50

## 2015-10-03 MED ORDER — DEXTROSE 50 % IV SOLN
INTRAVENOUS | Status: AC
Start: 2015-10-03 — End: 2015-10-03
  Administered 2015-10-03: 50 mL via INTRAVENOUS
  Filled 2015-10-03: qty 50

## 2015-10-03 MED ORDER — VANCOMYCIN 50 MG/ML ORAL SOLUTION
125.0000 mg | Freq: Four times a day (QID) | ORAL | Status: DC
  Administered 2015-10-03 – 2015-10-05 (×10): 125 mg via ORAL
  Filled 2015-10-03 (×14): qty 2.5

## 2015-10-03 MED ORDER — SODIUM CHLORIDE 0.9 % IV SOLN
5.0000 mg/kg | Freq: Once | INTRAVENOUS | Status: DC
  Filled 2015-10-03: qty 30

## 2015-10-03 MED ORDER — DEXTROSE 50 % IV SOLN
25.0000 mL | Freq: Once | INTRAVENOUS | Status: AC
  Administered 2015-10-03: 50 mL via INTRAVENOUS

## 2015-10-03 MED ORDER — ZINC OXIDE 40 % EX OINT
TOPICAL_OINTMENT | CUTANEOUS | Status: DC | PRN
  Filled 2015-10-03: qty 114
  Filled 2015-10-03: qty 56

## 2015-10-03 MED ORDER — KCL IN DEXTROSE-NACL 20-5-0.45 MEQ/L-%-% IV SOLN
INTRAVENOUS | Status: DC
  Administered 2015-10-03 – 2015-10-04 (×4): via INTRAVENOUS
  Filled 2015-10-03 (×6): qty 1000

## 2015-10-03 MED ORDER — SODIUM CHLORIDE 0.9% FLUSH
10.0000 mL | INTRAVENOUS | Status: DC | PRN
Start: 2015-10-03 — End: 2015-10-22
  Administered 2015-10-03 – 2015-10-22 (×9): 10 mL
  Filled 2015-10-03 (×9): qty 40

## 2015-10-03 MED ORDER — DEXTROSE-NACL 5-0.9 % IV SOLN
INTRAVENOUS | Status: DC
  Administered 2015-10-03: via INTRAVENOUS

## 2015-10-03 MED ORDER — SODIUM CHLORIDE 0.9 % IV SOLN
2.0000 g | Freq: Once | INTRAVENOUS | Status: AC
  Administered 2015-10-03: 2 g via INTRAVENOUS
  Filled 2015-10-03: qty 20

## 2015-10-03 NOTE — Consult Note (Signed)
Referral MD  Reason for Referral: Metastatic melanoma-severe diarrhea secondary to immunotherapy   Chief Complaint  Patient presents with  . Emesis  . Diarrhea  : I have diarrhea again.  HPI: Felicia Richmond is a very nice 55 year old white female. She has metastatic melanoma. We had tried her on immunotherapy. She has had diarrhea with this.  I decreased the dose of Yervoy with her last cycle. She had last cycle on February 2. Diarrhea started on the fifth. It is not improved. She is on some steroids.  She was hospitalized about 2 weeks ago with diarrhea. She had a colonoscopy with biopsies which showed lymphocytic colitis consistent with immunotherapy in fact.  She came in with hypokalemia. Her potassium was 2.9. Her sodium is 129. Her creatinine was okay. Her CBC was all right.  It is apparent that she is not going to be able to tolerate any immunotherapy. Even dose reduced, she has had this diarrhea.  I will try her on some Remicade which is a treatment for lymphocytic colitis.  She's had some chronic abdominal pain.  She has the subcutaneous nodules from her melanoma. They look about the same.  She's had no obvious fever. She's had some sweats. She's had no rashes. She's had no mouth sores. Patient does have a history of hyper thyroidism. She is on medication for this.  She's had no problems with leg swelling. She's had no joint problems.   Past Medical History  Diagnosis Date  . Thyroid disease   . Cancer ([place]) 08/25/14    metastatic melanoma; stage IV  . Emphysema of lung ([place])   . Glaucoma   . Stroke ([place])   . Meniere's disease   . Splenic infarct   . Hyperlipidemia 04/26/2015  . Hypertension   . PONV (postoperative nausea and vomiting)   :  Past Surgical History  Procedure Laterality Date  . Cholecystectomy  1992  . Colonoscopy N/A 09/20/2015    Procedure: COLONOSCOPY;  Surgeon: Ronald Lobo, MD;  Location: [facility];  Service: Endoscopy;  Laterality: N/A;   :   Current facility-administered medications:  .  acetaminophen (TYLENOL) tablet 650 mg, 650 mg, Oral, Q6H PRN **OR** acetaminophen (TYLENOL) suppository 650 mg, 650 mg, Rectal, Q6H PRN, Rise Patience, MD .  budesonide (ENTOCORT EC) 24 hr capsule 9 mg, 9 mg, Oral, Daily, Rise Patience, MD .  dextrose 5 %-0.9 % sodium chloride infusion, , Intravenous, Continuous, Rise Patience, MD, Last Rate: 125 mL/hr at 10/03/15 0018 .  dronabinol (MARINOL) capsule 2.5 mg, 2.5 mg, Oral, BID AC, Rise Patience, MD .  famotidine (PEPCID) tablet 20 mg, 20 mg, Oral, Daily, Rise Patience, MD .  fluconazole (DIFLUCAN) tablet 100 mg, 100 mg, Oral, Daily, Rise Patience, MD .  inFLIXimab (REMICADE) 5 mg/kg = 300 mg in sodium chloride 0.9 % 250 mL infusion, 5 mg/kg, Intravenous, Once, Volanda Napoleon, MD .  iohexol (OMNIPAQUE) 300 MG/ML solution 25 mL, 25 mL, Oral, Q1 Hr x 2, Rise Patience, MD .  LORazepam (ATIVAN) tablet 0.5 mg, 0.5 mg, Oral, Q6H PRN, Rise Patience, MD .  meclizine (ANTIVERT) tablet 25 mg, 25 mg, Oral, Daily PRN, Rise Patience, MD .  methimazole (TAPAZOLE) tablet 5 mg, 5 mg, Oral, Daily, Rise Patience, MD .  morphine (MS CONTIN) 12 hr tablet 30 mg, 30 mg, Oral, Q12H, Rise Patience, MD, 30 mg at 10/03/15 0010 .  morphine (MSIR) tablet 15 mg, 15 mg, Oral,  Q6H PRN, Rise Patience, MD .  ondansetron [facility]) tablet 4 mg, 4 mg, Oral, Q6H PRN **OR** ondansetron (ZOFRAN) injection 4 mg, 4 mg, Intravenous, Q6H PRN, Rise Patience, MD .  sodium chloride flush (NS) 0.9 % injection 10-40 mL, 10-40 mL, Intracatheter, PRN, Rise Patience, MD, 10 mL at 10/03/15 0629:  . budesonide  9 mg Oral Daily  . dronabinol  2.5 mg Oral BID AC  . famotidine  20 mg Oral Daily  . fluconazole  100 mg Oral Daily  . inFLIXimab (REMICADE) infusion  5 mg/kg Intravenous Once  . iohexol  25 mL Oral Q1 Hr x 2  . methimazole  5 mg Oral Daily  . morphine   30 mg Oral Q12H  :  Allergies  Allergen Reactions  . Cotellic [Cobimetinib] Diarrhea  . Oxycodone Nausea And Vomiting  . Pseudoephedrine Other (See Comments)    Makes patient feel weird  . Scopolamine Other (See Comments)  . Tramadol     Other reaction(s): GI Upset (intolerance)  :  Family History  Problem Relation Age of Onset  . Thyroid disease Mother   . Thyroid disease Sister   . Cancer Sister   . Heart attack Brother   :  Social History   Social History  . Marital Status: Married    Spouse Name: N/A  . Number of Children: N/A  . Years of Education: N/A   Occupational History  . Not on file.   Social History Main Topics  . Smoking status: Current Every Day Smoker -- 1.00 packs/day for 36 years    Types: Cigarettes  . Smokeless tobacco: Never Used  . Alcohol Use: No  . Drug Use: No  . Sexual Activity: Yes    Birth Control/ Protection: None   Other Topics Concern  . 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  Exam: Patient Vitals for the past 24 hrs:  BP Temp Temp src Pulse Resp SpO2 Height Weight  10/03/15 0500 (!) 124/55 mmHg 97.6 F (36.4 C) Oral (!) 104 (!) 22 95 % - -  10/02/15 2104 (!) 141/55 mmHg 97.5 F (36.4 C) Oral (!) 115 (!) 22 99 % 5\' 1"  (1.549 m) 141 lb 12.1 oz (64.3 kg)  10/02/15 2019 127/64 mmHg - - 110 26 90 % - -  10/02/15 1548 136/82 mmHg 98.2 F (36.8 C) Oral 119 20 95 % - -    as above    Recent Labs  10/02/15 1707 10/03/15 0630  WBC 15.9* 13.2*  HGB 11.5* 10.7*  HCT 36.3 34.2*  PLT 262 247    Recent Labs  10/02/15 1707 10/03/15 0630  NA 129* 130*  K 2.9* 3.4*  CL 94* 100*  CO2 23 22  GLUCOSE 76 91  BUN 10 7  CREATININE 0.61 0.59  CALCIUM 7.4* 6.6*    Blood smear review:   None  Pathology: None     Assessment and Plan: Felicia Richmond is a 55 year old white female. Showed metastatic melanoma. We are trying her on immunotherapy with very little effect because of toxicity. This is her second cycle of Opdivo/Yervoy. Diarrhea has been a big problem.  Hopefully, the Remicade will help slow down.  She will continue  on IV fluids. Her potassium is better.  We will certainly have to reevaluate her melanoma. It is hard to say if we are seeing a response. Typically, with immunotherapy, it can take several weeks before a response is seen.  If we ever have to treat her in the future, then the only option would be systemic chemotherapy.  I do appreciate the standing care that she is getting from everybody up on [address].  Pete E.  Romans 13:1

## 2015-10-03 NOTE — Progress Notes (Signed)
[facility] PROGRESS NOTE  Felicia Richmond [id number] DOB: 1960-12-24 DOA: 10/02/2015 PCP: Nance Pear., NP  Brief Summary  The patient is a 55 year old female with history of metastatic melanoma who is admitted 2 weeks prior to admission for diarrhea. At that time colonoscopy demonstrated lymphocytic colitis and she was placed on Entocort.  She presented with a three day history of worsening watery diarrhea and abdominal pain, particularly in the left upper and lower quadrant.  She denied recent antibiotics, fevers, chills.  She was hypokalemic.    Assessment/Plan  C. difficile diarrhea, risk factor was recent hospitalization -  Start vancomycin, day 1 on 2/8  Recent biopsy-proven lymphocytic colitis attributed to her immunotherapy for melanoma.  - continue Entocort - adjustments made to melanoma tx per Dr. Marin Olp - Remicade held due to C. Diff colitis  Metastatic melanoma, progression of disease on CT scan -  Management per Oncology > start on remicaide and solumedrol   Hypokalemia due to diarrhea -  Magnesium 1.7 > give 2 gm IV magnesium -  Add potassium to IVF -  Check phosphorus with AML  Hypocalcemia (even with correction for albumin) -  Calcium gluconate 2mg  IV once  Hyperthyroidism, stable, continue methimazole  Leukocytosis, due to infection -  Trend WBC  Normocytic anemia, likely anemia of chronic disease due to malignancy and chemo, occult stool recently neg 09/19/2015 -  Repeat hgb in AM    Diet:  FLD Access:  PIV IVF:  yes Proph:  SCDs  > Dr. Marin Olp to address safety of lovenox for DVT prophylaxis in the patient  Code Status:  full Family Communication: patient alone Disposition Plan:  Pending improvement in diarrhea, tolerating adequate PO to stay hydrated, likely home in several more days   Consultants:  Oncology, Dr. Marin Olp  Procedures:  CT abd/pelvis  Antibiotics:  Oral vancomycin 2/8    HPI/Subjective:  Continues to have  severe left upper quadrant pain and left lower quadrant pain that is severe.  Shifting does not improve her pain.  Having frequent incontinent loose/watery stools that are causing skin breakdown around her anus    Objective: Filed Vitals:   10/02/15 2104 10/03/15 0500 10/03/15 1241 10/03/15 1404  BP: 141/55 124/55 128/85 130/52  Pulse: 115 104 106 100  Temp: 97.5 F (36.4 C) 97.6 F (36.4 C) 98.3 F (36.8 C) 98.3 F (36.8 C)  TempSrc: Oral Oral Oral Oral  Resp: 22 22 20 20   Height: 5\' 1"  (1.549 m)     Weight: 64.3 kg (141 lb 12.1 oz)     SpO2: 99% 95% 97% 95%    Intake/Output Summary (Last 24 hours) at 10/03/15 1600 Last data filed at 10/03/15 1241  Gross per 24 hour  Intake 1807.5 ml  Output      0 ml  Net 1807.5 ml   Filed Weights   10/02/15 2104  Weight: 64.3 kg (141 lb 12.1 oz)   Body mass index is 26.8 kg/(m^2).  Exam:   General:  Adult female, No acute distress but ill-appearing  HEENT:  NCAT, MMM  Cardiovascular:  RRR, nl S1, S2 no mrg, 2+ pulses, warm extremities  Respiratory:  CTAB, no increased WOB  Abdomen:   Hyperactive BS, soft, TTP in the LUQ and LLQ and epigastrium to light palpation, no rebound or guarding  MSK:   Normal tone and bulk, no LEE  Data Reviewed: Basic Metabolic Panel:  Recent Labs Lab 09/27/15 1050 10/02/15 1707 10/03/15 0630  NA 131 129* 130*  K  3.5 2.9* 3.4*  CL 92* 94* 100*  CO2 31 23 22   GLUCOSE 95 76 91  BUN 8 10 7   CREATININE 0.6 0.61 0.59  CALCIUM 8.2 7.4* 6.6*  MG  --  1.7  --    Liver Function Tests:  Recent Labs Lab 09/27/15 1050 10/02/15 1707 10/03/15 0630  AST 26 20 18   ALT 19 9* 7*  ALKPHOS 70 60 56  BILITOT 0.70 0.8 0.6  PROT 5.4* 5.0* 4.3*  ALBUMIN 2.5* 2.3* 2.0*    Recent Labs Lab 10/02/15 1707  LIPASE 19   No results for input(s): AMMONIA in the last 168 hours. CBC:  Recent Labs Lab 09/27/15 1049 10/02/15 1707 10/03/15 0630  WBC 23.0* 15.9* 13.2*  NEUTROABS 18.0*  --  9.7*  HGB  12.1 11.5* 10.7*  HCT 38.1 36.3 34.2*  MCV 80* 79.6 82.2  PLT 401* 262 247    Recent Results (from the past 240 hour(s))  Gastrointestinal Panel by PCR , Stool     Status: None   Collection Time: 10/02/15  7:57 PM  Result Value Ref Range Status   Campylobacter species NOT DETECTED NOT DETECTED Final   Plesimonas shigelloides NOT DETECTED NOT DETECTED Final   Salmonella species NOT DETECTED NOT DETECTED Final   Yersinia enterocolitica NOT DETECTED NOT DETECTED Final   Vibrio species NOT DETECTED NOT DETECTED Final   Vibrio cholerae NOT DETECTED NOT DETECTED Final   Enteroaggregative E coli (EAEC) NOT DETECTED NOT DETECTED Final   Enteropathogenic E coli (EPEC) NOT DETECTED NOT DETECTED Final   Enterotoxigenic E coli (ETEC) NOT DETECTED NOT DETECTED Final   Shiga like toxin producing E coli (STEC) NOT DETECTED NOT DETECTED Final   E. coli O157 NOT DETECTED NOT DETECTED Final   Shigella/Enteroinvasive E coli (EIEC) NOT DETECTED NOT DETECTED Final   Cryptosporidium NOT DETECTED NOT DETECTED Final   Cyclospora cayetanensis NOT DETECTED NOT DETECTED Final   Entamoeba histolytica NOT DETECTED NOT DETECTED Final   Giardia lamblia NOT DETECTED NOT DETECTED Final   Adenovirus F40/41 NOT DETECTED NOT DETECTED Final   Astrovirus NOT DETECTED NOT DETECTED Final   Norovirus GI/GII NOT DETECTED NOT DETECTED Final   Rotavirus A NOT DETECTED NOT DETECTED Final   Sapovirus (I, II, IV, and V) NOT DETECTED NOT DETECTED Final  C difficile quick scan w PCR reflex     Status: Abnormal   Collection Time: 10/03/15 12:45 PM  Result Value Ref Range Status   C Diff antigen POSITIVE (A) NEGATIVE Final   C Diff toxin POSITIVE (A) NEGATIVE Final   C Diff interpretation Positive for toxigenic C. difficile  Final    Comment: CRITICAL RESULT CALLED TO, READ BACK BY AND VERIFIED WITH: KIM CHEEK RN 2.8.17 @ 1535 BY RICEJ      Studies: Ct Abdomen Pelvis W Contrast  10/03/2015  CLINICAL DATA:  Generalized  abdominal pain for 5 days, known history of metastatic melanoma EXAM: CT ABDOMEN AND PELVIS WITH CONTRAST TECHNIQUE: Multidetector CT imaging of the abdomen and pelvis was performed using the standard protocol following bolus administration of intravenous contrast. CONTRAST:  141mL OMNIPAQUE IOHEXOL 300 MG/ML  SOLN COMPARISON:  07/27/2015 FINDINGS: The lung bases demonstrate large right-sided pleural effusion and small left-sided pleural effusion. Right lower lobe consolidation is noted. In the right breast there is again noted a lobulated peripherally enhancing mass lesion which has grown in the interval from the prior exam. It now measures 7.3 x 5.5 cm. It previously measured 5.3 cm  in greatest dimension. The liver is within normal limits. An area of focal fatty sparing is seen. The gallbladder is been surgically removed. Large right-sided adrenal mass lesion is again identified with peripheral enhancement. It now measures 12 cm in greatest dimension which is in increased from 8.1 cm on the prior exam. The left adrenal lesion now measures 11.1 cm also increased from previous measuring 9.2 cm. The spleen and pancreas are within normal limits. The metastatic lesion in the mesenteric root now measures 6 cm best seen on image number 34 series 2. This is stable from the prior exam. A pericolonic implant is noted adjacent to the proximal transverse colon measuring 3.4 cm. This is also roughly stable from the prior exam. A few scattered small peritoneal implants are noted which are slightly larger than that seen on the prior exam. No obstructive changes are noted within the bowel. The uterus and bladder appear within normal limits. Mild free fluid is noted within the abdomen and pelvis. Diffuse subcutaneous edema is noted in the abdominal wall consistent with anasarca. Mild compression upon the proximal abdominal aorta is noted secondary to the adrenal masses. A metastatic deposit is again noted in the left rectus muscle  stable from the previous exam. No bony metastatic lesions are seen. IMPRESSION: Progression in the degree of diffuse metastatic disease consistent with the patient's given clinical history. Enlargement of bilateral adrenal masses as well as a right breast mass and scattered peritoneal implants is noted. New third spacing of fluid is noted within the abdominal wall as well as new mild free fluid within the abdomen and pelvis. No other acute abnormality is noted Electronically Signed   By: Inez Catalina M.D.   On: 10/03/2015 13:47    Scheduled Meds: . budesonide  6 mg Oral Daily  . dronabinol  2.5 mg Oral BID AC  . famotidine  20 mg Oral Daily  . fluconazole  100 mg Oral Daily  . inFLIXimab (REMICADE) infusion  5 mg/kg Intravenous Once  . methimazole  5 mg Oral Daily  . methylPREDNISolone (SOLU-MEDROL) injection  125 mg Intravenous Daily  . morphine  30 mg Oral Q12H  . vancomycin  125 mg Oral QID   Continuous Infusions: . dextrose 5 % and 0.45 % NaCl with KCl 20 mEq/L 100 mL/hr at 10/03/15 [id number]    Active Problems:   Hyperthyroidism   Metastatic melanoma ([place])   Diarrhea   Hypokalemia   Abdominal pain   Malnutrition of moderate degree    Time spent: 30 min    Koya Hunger, [place]  [facility] Pager [phone]. If 7PM-7AM, please contact night-coverage at [email or web address], password [facility] 10/03/2015, 4:00 PM  LOS: 1 day

## 2015-10-03 NOTE — Care Management Note (Signed)
Case Management Note  Patient Details  Name: Felicia Richmond MRN: [id number] Date of Birth: 11/30/1960  Subjective/Objective:   55 y/o f admitted w/n/v/abd pain. [id number] Melanoma. From home.                 Action/Plan:d/c plan home.   Expected Discharge Date:                  Expected Discharge Plan:  Home/Self Care  In-House Referral:     Discharge planning Services  CM Consult  Post Acute Care Choice:    Choice offered to:     DME Arranged:    DME Agency:     HH Arranged:    HH Agency:     Status of Service:  In process, will continue to follow  Medicare Important Message Given:    Date Medicare IM Given:    Medicare IM give by:    Date Additional Medicare IM Given:    Additional Medicare Important Message give by:     If discussed at [place] of Stay Meetings, dates discussed:    Additional Comments:  Dessa Phi, RN 10/03/2015, 11:34 AM

## 2015-10-03 NOTE — Progress Notes (Signed)
Initial Nutrition Assessment  DOCUMENTATION CODES:   Non-severe (moderate) malnutrition in context of acute illness/injury  INTERVENTION:  - Diet advancement as medically feasible - RD will continue to monitor for needs  NUTRITION DIAGNOSIS:   Inadequate oral intake related to acute illness, nausea, vomiting, inability to eat as evidenced by per patient/family report, NPO status.  GOAL:   Patient will meet greater than or equal to 90% of their needs  MONITOR:   Diet advancement, Weight trends, Labs, I & O's  REASON FOR ASSESSMENT:   Malnutrition Screening Tool  ASSESSMENT:   55 y.o. female with history of metastatic melanoma who was recently admitted 2 weeks ago for diarrhea and colonoscopy showed lymphocytic colitis and is on Entocort present to the ER because of recurrence of diarrhea. Patient states over the last 3 days patient is having multiple episodes of diarrhea denies any nausea vomiting but at this time patient is also having abdominal pain diffusely. Denies any antibiotics intake except for Diflucan for oral candidiasis. Denies any fever chills chest pain shortness of breath. Patient has been admitted for further management of dehydration diarrhea and abdominal pain. Patient is also found to be hypokalemic.   Pt seen for MST. BMI indicates overweight status. Pt has been NPO since admission and unable to meet needs. Pt states she has had ongoing N/V/D since Saturday (2/4). She is currently nauseated and began having active vomiting during RD visit so not all information was able to be obtained during this assessment.  Pt states that appetite had previously been poor but was beginning to get better until N/V/D began on 2/4. She denies any chewing or swallowing difficulties. She states that she has been experiencing taste alteration (metallic taste) but that this only occurs with some items; unable to obtain further information as this is the point at which vomiting  began.  Per chart review, pt has lost 5 lbs (3.4% body weight) in the past 9 days which is significant for time frame. No muscle or fat wasting noted to upper body and lower body was not assessed at this time.   RD will obtain further information at follow-up and monitor for associated needs. Medications reviewed. Labs reviewed; CBGs: 61-152 mg/dL, Na: 130 mmol/L, K: 3.4 mmol/L, Cl: 100 mmol/L, Ca: 6.6 mg/dL.   Diet Order:  Diet NPO time specified Except for: Sips with Meds  Skin:  Reviewed, no issues  Last BM:  2/8  Height:   Ht Readings from Last 1 Encounters:  10/02/15 5\' 1"  (1.549 m)    Weight:   Wt Readings from Last 1 Encounters:  10/02/15 141 lb 12.1 oz (64.3 kg)    Ideal Body Weight:  47.73 kg (kg)  BMI:  Body mass index is 26.8 kg/(m^2).  Estimated Nutritional Needs:   Kcal:  1920-2115 (30-33 kcal/kg)  Protein:  75-90 grams (1.2-1.4 grams/kg)  Fluid:  >/= 2.1 L/day  EDUCATION NEEDS:   No education needs identified at this time     Jarome Matin, RD, LDN Inpatient Clinical Dietitian Pager # [phone] After hours/weekend pager # [phone]

## 2015-10-04 DIAGNOSIS — A0472 Enterocolitis due to Clostridium difficile, not specified as recurrent: Secondary | ICD-10-CM | POA: Diagnosis present

## 2015-10-04 LAB — CBC WITH DIFFERENTIAL/PLATELET
BASOS PCT: 0 %
Basophils Absolute: 0 10*3/uL (ref 0.0–0.1)
EOS ABS: 0.2 10*3/uL (ref 0.0–0.7)
Eosinophils Relative: 2 %
HCT: 34.1 % — ABNORMAL LOW (ref 36.0–46.0)
Hemoglobin: 10.7 g/dL — ABNORMAL LOW (ref 12.0–15.0)
LYMPHS ABS: 1.6 10*3/uL (ref 0.7–4.0)
Lymphocytes Relative: 20 %
MCH: 25.8 pg — ABNORMAL LOW (ref 26.0–34.0)
MCHC: 31.4 g/dL (ref 30.0–36.0)
MCV: 82.4 fL (ref 78.0–100.0)
MONO ABS: 0.7 10*3/uL (ref 0.1–1.0)
Monocytes Relative: 9 %
NEUTROS ABS: 5.6 10*3/uL (ref 1.7–7.7)
NEUTROS PCT: 69 %
Platelets: 235 10*3/uL (ref 150–400)
RBC: 4.14 MIL/uL (ref 3.87–5.11)
RDW: 19.6 % — AB (ref 11.5–15.5)
WBC: 8.1 10*3/uL (ref 4.0–10.5)

## 2015-10-04 LAB — HEPATITIS B CORE ANTIBODY, IGM: Hep B C IgM: NEGATIVE

## 2015-10-04 LAB — COMPREHENSIVE METABOLIC PANEL
ALBUMIN: 1.9 g/dL — AB (ref 3.5–5.0)
ALT: 9 U/L — ABNORMAL LOW (ref 14–54)
ANION GAP: 7 (ref 5–15)
AST: 15 U/L (ref 15–41)
Alkaline Phosphatase: 64 U/L (ref 38–126)
BILIRUBIN TOTAL: 0.5 mg/dL (ref 0.3–1.2)
BUN: 7 mg/dL (ref 6–20)
CO2: 22 mmol/L (ref 22–32)
Calcium: 7 mg/dL — ABNORMAL LOW (ref 8.9–10.3)
Chloride: 100 mmol/L — ABNORMAL LOW (ref 101–111)
Creatinine, Ser: 0.64 mg/dL (ref 0.44–1.00)
GFR calc non Af Amer: >60 mL/min (ref >60)
GLUCOSE: 105 mg/dL — AB (ref 65–99)
POTASSIUM: 3.5 mmol/L (ref 3.5–5.1)
SODIUM: 129 mmol/L — AB (ref 135–145)
TOTAL PROTEIN: 4.3 g/dL — AB (ref 6.5–8.1)

## 2015-10-04 LAB — GLUCOSE, CAPILLARY
GLUCOSE-CAPILLARY: 102 mg/dL — AB (ref 65–99)
GLUCOSE-CAPILLARY: 87 mg/dL (ref 65–99)
GLUCOSE-CAPILLARY: 97 mg/dL (ref 65–99)

## 2015-10-04 LAB — PHOSPHORUS: PHOSPHORUS: 2.3 mg/dL — AB (ref 2.5–4.6)

## 2015-10-04 LAB — HEPATITIS B SURFACE ANTIGEN: HEP B S AG: NEGATIVE

## 2015-10-04 LAB — MAGNESIUM: Magnesium: 2 mg/dL (ref 1.7–2.4)

## 2015-10-04 MED ORDER — SODIUM CHLORIDE 0.9 % IV SOLN
2.0000 g | Freq: Once | INTRAVENOUS | Status: AC
  Administered 2015-10-04: 2 g via INTRAVENOUS
  Filled 2015-10-04: qty 20

## 2015-10-04 MED ORDER — SACCHAROMYCES BOULARDII 250 MG PO CAPS
250.0000 mg | ORAL_CAPSULE | Freq: Two times a day (BID) | ORAL | Status: DC
  Administered 2015-10-04 – 2015-10-10 (×13): 250 mg via ORAL
  Filled 2015-10-04 (×14): qty 1

## 2015-10-04 MED ORDER — POTASSIUM PHOSPHATE MONOBASIC 500 MG PO TABS
1000.0000 mg | ORAL_TABLET | Freq: Two times a day (BID) | ORAL | Status: AC
  Administered 2015-10-04 (×2): 1000 mg via ORAL
  Filled 2015-10-04 (×2): qty 2

## 2015-10-04 NOTE — Care Management Note (Signed)
Case Management Note  Patient Details  Name: Felicia Richmond MRN: [id number] Date of Birth: Mar 24, 1961  Subjective/Objective:          55 yo admitted with Diarrhea          Action/Plan: From home with spouse. Active with [facility] prior to admission.  Expected Discharge Date:                  Expected Discharge Plan:  [place]  In-House Referral:     Discharge planning Services  CM Consult  Post Acute Care Choice:  Resumption of Svcs/PTA Provider Choice offered to:  Patient  DME Arranged:    DME Agency:     HH Arranged:  RN [place] Agency:  [place]  Status of Service:  In process, will continue to follow  Medicare Important Message Given:    Date Medicare IM Given:    Medicare IM give by:    Date Additional Medicare IM Given:    Additional Medicare Important Message give by:     If discussed at [place] of Stay Meetings, dates discussed:    Additional Comments: Will need MD orders to resume home health at discharge. CM will continue to follow. Lynnell Catalan, RN 10/04/2015, 10:50 AM

## 2015-10-04 NOTE — Progress Notes (Signed)
Shockingly enough, she has Clostridium. I find it hard to believe that this popped up in matter of 2 weeks. She was admitted, was go with diarrhea. She underwent C. difficile testing. She underwent colonoscopy. She underwent biopsies. Everything came back okay outside of some lymphocytic lives with felt was from her immunotherapy.  I'm not sure if another colonoscopy is indicated. She's having problems with vomiting. I am not sure as to why this is the case.  I just worry about her nutrition. I think that she may have to go back onto TNA.  I wonder if she needs to have an upper endoscopy to try to help sort out all the vomiting issues.  Given that she has C. difficile, I'm not sure we should give her Remicade. I'm sure some of the diarrhea is from the immunotherapy.  She is on oral vancomycin. This should be effective in helping eradicate the C. difficile.  Her labs show that her potassium is up to 3.5. Albumin is 1.9. Her hemoglobin is 10.7. Platelet count 235,000. Her white cell count is 8.1.  I can't find anything on her visual exam right now that is different than yesterday. She does have some tenderness in the abdomen. She has good bowel sounds. She has no mass area  Her pain control seems to be doing okay.  I'm still somewhat perplexed as to how she can have C. difficile now and just 2 weeks ago, with the same symptoms, everything was negative area did  Her enteric pathogen analysis is normal.  I very much appreciate all the os any care that she is getting.  Pete E.  Psalm 35:1

## 2015-10-04 NOTE — Progress Notes (Signed)
[facility] PROGRESS NOTE  Felicia Richmond [id number] DOB: 03-10-1961 DOA: 10/02/2015 PCP: Nance Pear., NP  Brief Summary  The patient is a 55 year old female with history of metastatic melanoma who is admitted 2 weeks prior to admission for diarrhea. At that time colonoscopy demonstrated lymphocytic colitis and she was placed on Entocort.  She presented with a three day history of worsening watery diarrhea and abdominal pain, particularly in the left upper and lower quadrant.  She denied recent antibiotics, fevers, chills.  She was hypokalemic.    Assessment/Plan  C. 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  - continue Entocort - adjustments made to melanoma tx per Dr. Marin Olp - Remicade and steroids held due to C. Diff colitis  Metastatic melanoma, progression of disease on CT scan -  Management per Oncology  Hypokalemia due to diarrhea -  Magnesium 1.7 > give 2 gm IV magnesium -  Continue potassium to IVF -  Supplement phosphorus   Hypocalcemia (even with correction for albumin) -  Continue Calcium gluconate 2mg  IV once  Hyperthyroidism, stable, continue methimazole  Leukocytosis, due to infection and trending down with oral vancomycin -  Trend WBC  Normocytic anemia, likely anemia of chronic disease due to malignancy and chemo, occult stool recently neg 09/19/2015 -  Repeat hgb in AM  Diet:  FLD Access:  PIV IVF:  yes Proph:  SCDs  > Dr. Marin Olp to address safety of lovenox for DVT  prophylaxis in the patient  Code Status:  full Family Communication: patient alone Disposition Plan:  Pending improvement in diarrhea, tolerating adequate PO to stay hydrated, likely home in several more days   Consultants:  Oncology, Dr. Marin Olp  Procedures:  CT abd/pelvis  Antibiotics:  Oral vancomycin 2/8    HPI/Subjective:  Continues to have copious watery diarrhea unchanged in frequency, consistency. She is still incontinent of stool. Her abdominal pain is improved since yesterday.   Objective: Filed Vitals:   10/03/15 1420 10/03/15 1855 10/03/15 2059 10/04/15 0649  BP:  109/56 104/50 123/61  Pulse:   96 86  Temp:  98.7 F (37.1 C) 98.5 F (36.9 C) 98.6 F (37 C)  TempSrc:   Oral Oral  Resp:  22    Height: 5\' 1"  (1.549 m)     Weight: 66.1 kg (145 lb 11.6 oz)     SpO2:  95% 95% 94%    Intake/Output Summary (Last 24 hours) at 10/04/15 1308 Last data filed at 10/04/15 0600  Gross per 24 hour  Intake 2323.33 ml  Output    452 ml  Net 1871.33 ml   Filed Weights   10/02/15 2104 10/03/15 1420  Weight: 64.3 kg (141 lb 12.1 oz) 66.1 kg (145 lb 11.6 oz)   Body mass index is 27.55 kg/(m^2).  Exam:   General:  Adult female, No acute distress but ill-appearing  HEENT:  NCAT, MMM  Cardiovascular:  RRR, nl S1, S2 no mrg, 2+ pulses, warm extremities  Respiratory:  CTAB, no increased WOB  Abdomen:   Hyperactive BS, soft, TTP in the  LUQ and LLQ and epigastrium to moderate palpation, no rebound or guarding  MSK:   Normal tone and bulk, no LEE  Data Reviewed: Basic Metabolic Panel:  Recent Labs Lab 10/02/15 1707 10/03/15 0630 10/04/15 0610  NA 129* 130* 129*  K 2.9* 3.4* 3.5  CL 94* 100* 100*  CO2 23 22 22   GLUCOSE 76 91 105*  BUN 10 7 7   CREATININE 0.61 0.59 0.64  CALCIUM 7.4* 6.6* 7.0*  MG 1.7  --  2.0  PHOS  --   --  2.3*   Liver Function Tests:  Recent Labs Lab 10/02/15 1707 10/03/15 0630 10/04/15 0610  AST 20 18 15   ALT 9* 7* 9*   ALKPHOS 60 56 64  BILITOT 0.8 0.6 0.5  PROT 5.0* 4.3* 4.3*  ALBUMIN 2.3* 2.0* 1.9*    Recent Labs Lab 10/02/15 1707  LIPASE 19   No results for input(s): AMMONIA in the last 168 hours. CBC:  Recent Labs Lab 10/02/15 1707 10/03/15 0630 10/04/15 0610  WBC 15.9* 13.2* 8.1  NEUTROABS  --  9.7* 5.6  HGB 11.5* 10.7* 10.7*  HCT 36.3 34.2* 34.1*  MCV 79.6 82.2 82.4  PLT 262 247 235    Recent Results (from the past 240 hour(s))  Gastrointestinal Panel by PCR , Stool     Status: None   Collection Time: 10/02/15  7:57 PM  Result Value Ref Range Status   Campylobacter species NOT DETECTED NOT DETECTED Final   Plesimonas shigelloides NOT DETECTED NOT DETECTED Final   Salmonella species NOT DETECTED NOT DETECTED Final   Yersinia enterocolitica NOT DETECTED NOT DETECTED Final   Vibrio species NOT DETECTED NOT DETECTED Final   Vibrio cholerae NOT DETECTED NOT DETECTED Final   Enteroaggregative E coli (EAEC) NOT DETECTED NOT DETECTED Final   Enteropathogenic E coli (EPEC) NOT DETECTED NOT DETECTED Final   Enterotoxigenic E coli (ETEC) NOT DETECTED NOT DETECTED Final   Shiga like toxin producing E coli (STEC) NOT DETECTED NOT DETECTED Final   E. coli O157 NOT DETECTED NOT DETECTED Final   Shigella/Enteroinvasive E coli (EIEC) NOT DETECTED NOT DETECTED Final   Cryptosporidium NOT DETECTED NOT DETECTED Final   Cyclospora cayetanensis NOT DETECTED NOT DETECTED Final   Entamoeba histolytica NOT DETECTED NOT DETECTED Final   Giardia lamblia NOT DETECTED NOT DETECTED Final   Adenovirus F40/41 NOT DETECTED NOT DETECTED Final   Astrovirus NOT DETECTED NOT DETECTED Final   Norovirus GI/GII NOT DETECTED NOT DETECTED Final   Rotavirus A NOT DETECTED NOT DETECTED Final   Sapovirus (I, II, IV, and V) NOT DETECTED NOT DETECTED Final  C difficile quick scan w PCR reflex     Status: Abnormal   Collection Time: 10/03/15 12:45 PM  Result Value Ref Range Status   C Diff antigen POSITIVE (A)  NEGATIVE Final   C Diff toxin POSITIVE (A) NEGATIVE Final   C Diff interpretation Positive for toxigenic C. difficile  Final    Comment: CRITICAL RESULT CALLED TO, READ BACK BY AND VERIFIED WITH: KIM CHEEK RN 2.8.17 @ 1535 BY RICEJ   MRSA PCR Screening     Status: None   Collection Time: 10/03/15  2:30 PM  Result Value Ref Range Status   MRSA by PCR NEGATIVE NEGATIVE Final    Comment:        The GeneXpert MRSA Assay (FDA approved for NASAL specimens only), is one component of a comprehensive MRSA colonization surveillance program. It is not intended to diagnose MRSA infection  nor to guide or monitor treatment for MRSA infections.      Studies: Ct Abdomen Pelvis W Contrast  10/03/2015  CLINICAL DATA:  Generalized abdominal pain for 5 days, known history of metastatic melanoma EXAM: CT ABDOMEN AND PELVIS WITH CONTRAST TECHNIQUE: Multidetector CT imaging of the abdomen and pelvis was performed using the standard protocol following bolus administration of intravenous contrast. CONTRAST:  139mL OMNIPAQUE IOHEXOL 300 MG/ML  SOLN COMPARISON:  07/27/2015 FINDINGS: The lung bases demonstrate large right-sided pleural effusion and small left-sided pleural effusion. Right lower lobe consolidation is noted. In the right breast there is again noted a lobulated peripherally enhancing mass lesion which has grown in the interval from the prior exam. It now measures 7.3 x 5.5 cm. It previously measured 5.3 cm in greatest dimension. The liver is within normal limits. An area of focal fatty sparing is seen. The gallbladder is been surgically removed. Large right-sided adrenal mass lesion is again identified with peripheral enhancement. It now measures 12 cm in greatest dimension which is in increased from 8.1 cm on the prior exam. The left adrenal lesion now measures 11.1 cm also increased from previous measuring 9.2 cm. The spleen and pancreas are within normal limits. The metastatic lesion in the mesenteric  root now measures 6 cm best seen on image number 34 series 2. This is stable from the prior exam. A pericolonic implant is noted adjacent to the proximal transverse colon measuring 3.4 cm. This is also roughly stable from the prior exam. A few scattered small peritoneal implants are noted which are slightly larger than that seen on the prior exam. No obstructive changes are noted within the bowel. The uterus and bladder appear within normal limits. Mild free fluid is noted within the abdomen and pelvis. Diffuse subcutaneous edema is noted in the abdominal wall consistent with anasarca. Mild compression upon the proximal abdominal aorta is noted secondary to the adrenal masses. A metastatic deposit is again noted in the left rectus muscle stable from the previous exam. No bony metastatic lesions are seen. IMPRESSION: Progression in the degree of diffuse metastatic disease consistent with the patient's given clinical history. Enlargement of bilateral adrenal masses as well as a right breast mass and scattered peritoneal implants is noted. New third spacing of fluid is noted within the abdominal wall as well as new mild free fluid within the abdomen and pelvis. No other acute abnormality is noted Electronically Signed   By: Inez Catalina M.D.   On: 10/03/2015 13:47    Scheduled Meds: . budesonide  6 mg Oral Daily  . dronabinol  2.5 mg Oral BID AC  . famotidine  20 mg Oral Daily  . fluconazole  100 mg Oral Daily  . methimazole  5 mg Oral Daily  . morphine  30 mg Oral Q12H  . potassium phosphate (monobasic)  1,000 mg Oral BID WC  . vancomycin  125 mg Oral QID   Continuous Infusions: . dextrose 5 % and 0.45 % NaCl with KCl 20 mEq/L 100 mL/hr at 10/04/15 [id number]    Active Problems:   Hyperthyroidism   Metastatic melanoma ([place])   Diarrhea   Hypokalemia   Abdominal pain   Malnutrition of moderate degree    Time spent: 30 min    Conal Shetley, [place]  [facility] Pager [phone]. If 7PM-7AM,  please contact night-coverage at [email or web address], password [facility] 10/04/2015, 1:08 PM  LOS: 2 days

## 2015-10-04 NOTE — Progress Notes (Signed)
[facility]  Patient Status: Active (receiving services up to time of hospitalization)  [facility] is providing the following services: RN  If patient discharges after hours, please call [phone].   Felicia Richmond 10/04/2015, 9:14 AM

## 2015-10-05 ENCOUNTER — Inpatient Hospital Stay (HOSPITAL_COMMUNITY): Payer: BLUE CROSS/BLUE SHIELD

## 2015-10-05 ENCOUNTER — Other Ambulatory Visit: Payer: Self-pay | Admitting: Radiology

## 2015-10-05 DIAGNOSIS — R112 Nausea with vomiting, unspecified: Secondary | ICD-10-CM

## 2015-10-05 DIAGNOSIS — A047 Enterocolitis due to Clostridium difficile: Principal | ICD-10-CM

## 2015-10-05 DIAGNOSIS — J90 Pleural effusion, not elsewhere classified: Secondary | ICD-10-CM

## 2015-10-05 LAB — BASIC METABOLIC PANEL
Anion gap: 6 (ref 5–15)
BUN: 5 mg/dL — AB (ref 6–20)
CHLORIDE: 103 mmol/L (ref 101–111)
CO2: 22 mmol/L (ref 22–32)
Calcium: 7.3 mg/dL — ABNORMAL LOW (ref 8.9–10.3)
Creatinine, Ser: 0.46 mg/dL (ref 0.44–1.00)
GFR calc Af Amer: >60 mL/min (ref >60)
GFR calc non Af Amer: >60 mL/min (ref >60)
GLUCOSE: 78 mg/dL (ref 65–99)
POTASSIUM: 3.7 mmol/L (ref 3.5–5.1)
SODIUM: 131 mmol/L — AB (ref 135–145)

## 2015-10-05 LAB — CBC
HEMATOCRIT: 32.2 % — AB (ref 36.0–46.0)
Hemoglobin: 10.1 g/dL — ABNORMAL LOW (ref 12.0–15.0)
MCH: 25.8 pg — ABNORMAL LOW (ref 26.0–34.0)
MCHC: 31.4 g/dL (ref 30.0–36.0)
MCV: 82.4 fL (ref 78.0–100.0)
Platelets: 221 10*3/uL (ref 150–400)
RBC: 3.91 MIL/uL (ref 3.87–5.11)
RDW: 19.1 % — AB (ref 11.5–15.5)
WBC: 7.2 10*3/uL (ref 4.0–10.5)

## 2015-10-05 LAB — PHOSPHORUS: Phosphorus: 2.9 mg/dL (ref 2.5–4.6)

## 2015-10-05 LAB — MAGNESIUM: Magnesium: 1.9 mg/dL (ref 1.7–2.4)

## 2015-10-05 LAB — GLUCOSE, CAPILLARY
GLUCOSE-CAPILLARY: 79 mg/dL (ref 65–99)
GLUCOSE-CAPILLARY: 74 mg/dL (ref 65–99)
Glucose-Capillary: 70 mg/dL (ref 65–99)
Glucose-Capillary: 70 mg/dL (ref 65–99)
Glucose-Capillary: 81 mg/dL (ref 65–99)

## 2015-10-05 MED ORDER — MAGNESIUM SULFATE IN D5W 10-5 MG/ML-% IV SOLN
1.0000 g | Freq: Once | INTRAVENOUS | Status: AC
  Administered 2015-10-05: 1 g via INTRAVENOUS
  Filled 2015-10-05: qty 100

## 2015-10-05 MED ORDER — ENOXAPARIN SODIUM 40 MG/0.4ML ~~LOC~~ SOLN
40.0000 mg | SUBCUTANEOUS | Status: DC
  Administered 2015-10-05 – 2015-10-12 (×8): 40 mg via SUBCUTANEOUS
  Filled 2015-10-05 (×8): qty 0.4

## 2015-10-05 MED ORDER — FAMOTIDINE IN NACL 20-0.9 MG/50ML-% IV SOLN
20.0000 mg | Freq: Two times a day (BID) | INTRAVENOUS | Status: DC
  Administered 2015-10-05 – 2015-10-14 (×19): 20 mg via INTRAVENOUS
  Filled 2015-10-05 (×20): qty 50

## 2015-10-05 MED ORDER — BUDESONIDE 3 MG PO CPEP
3.0000 mg | ORAL_CAPSULE | Freq: Every day | ORAL | Status: DC
  Administered 2015-10-05 – 2015-10-07 (×3): 3 mg via ORAL
  Filled 2015-10-05 (×5): qty 1

## 2015-10-05 MED ORDER — CALCIUM CARBONATE ANTACID 500 MG PO CHEW: 1.0000 | CHEWABLE_TABLET | ORAL | Status: DC | PRN

## 2015-10-05 MED ORDER — ONDANSETRON HCL 4 MG/2ML IJ SOLN
4.0000 mg | Freq: Four times a day (QID) | INTRAMUSCULAR | Status: DC
  Administered 2015-10-05 – 2015-10-06 (×4): 4 mg via INTRAVENOUS
  Filled 2015-10-05 (×4): qty 2

## 2015-10-05 MED ORDER — SODIUM CHLORIDE 0.9 % IV SOLN: 5.0000 mg/kg | Freq: Once | INTRAVENOUS | Status: DC

## 2015-10-05 MED ORDER — POTASSIUM CHLORIDE 2 MEQ/ML IV SOLN
INTRAVENOUS | Status: DC
  Administered 2015-10-05 – 2015-10-10 (×9): via INTRAVENOUS
  Filled 2015-10-05 (×23): qty 1000

## 2015-10-05 MED ORDER — BOOST / RESOURCE BREEZE PO LIQD
1.0000 | Freq: Three times a day (TID) | ORAL | Status: DC
  Administered 2015-10-06 – 2015-10-17 (×13): 1 via ORAL

## 2015-10-05 MED ORDER — SODIUM CHLORIDE 0.9 % IV SOLN
1.0000 g | Freq: Once | INTRAVENOUS | Status: AC
  Administered 2015-10-05: 1 g via INTRAVENOUS
  Filled 2015-10-05: qty 10

## 2015-10-05 NOTE — Significant Event (Signed)
Hypoglycemic Event  CBG: 70  Treatment: snack Symptoms: none  Follow-up CBG: [id number] CBG Result:79  Possible Reasons for Event: Unknown  Comments/MD notified: Not applicable followed protocol   Felicia Richmond, [name]

## 2015-10-05 NOTE — Progress Notes (Signed)
ANTICOAGULATION CONSULT NOTE (brief note)  Pharmacy Consult for enoxaparin Indication: VTE prophylaxis  Allergies  Allergen Reactions  . Cotellic [Cobimetinib] Diarrhea  . Oxycodone Nausea And Vomiting  . Pseudoephedrine Other (See Comments)    Makes patient feel weird  . Scopolamine Other (See Comments)  . Tramadol     Other reaction(s): GI Upset (intolerance)   1 YOF with metastatic melanoma, presents with persistent diarrhea (CDAD and immune-mediated colitis).  Pharmacy asked to dose enoxaparin for VTE prophylaxis.  CBC:  Hgb decreased but stable, pltc WNL Renal: CrCl > 85ml/min Weight: 66kg  Plan:  Enoxaparin 40mg  SQ q24h  Weekly SCr  Do not anticipate any dose adjustments being needed.  Will sign-off  Patient Measurements: Height: 5\' 1"  (154.9 cm) Weight: 145 lb 11.6 oz (66.1 kg) IBW/kg (Calculated) : 47.8 Heparin Dosing Weight:   Vital Signs: Temp: 97.7 F (36.5 C) (02/10 0615) Temp Source: Oral (02/10 0615) BP: 117/60 mmHg (02/10 0615) Pulse Rate: 72 (02/10 0615)  Labs:  Recent Labs  10/03/15 0630 10/04/15 0610 10/05/15 0530  HGB 10.7* 10.7* 10.1*  HCT 34.2* 34.1* 32.2*  PLT 247 235 221  CREATININE 0.59 0.64 0.46    Estimated Creatinine Clearance: 69.1 mL/min (by C-G formula based on Cr of 0.46).   Medical History: Past Medical History  Diagnosis Date  . Thyroid disease   . Cancer ([place]) 08/25/14    metastatic melanoma; stage IV  . Emphysema of lung ([place])   . Glaucoma   . Stroke ([place])   . Meniere's disease   . Splenic infarct   . Hyperlipidemia 04/26/2015  . Hypertension   . PONV (postoperative nausea and vomiting)    Doreene Eland, PharmD, BCPS.   Pager: [phone] 10/05/2015 8:48 AM

## 2015-10-05 NOTE — Progress Notes (Signed)
Mrs. Plott is about the same. She has C. difficile. She is on oral vancomycin. She is still having some diarrhea. She still having nausea and vomiting. I really cannot figure out why she has all this nausea and vomiting area and on her CT scan, shows have the pleural effusion. There is some consolidation. I just wonder this might be somehow contributing to vagus nerve stimulation.  It might be reasonable to see about getting this pleural effusion drained. I would certainly test it for melanoma. Again this might help with some of the vomiting.  The other suggestion might be an upper endoscopy which be more invasive.  She is on oral vancomycin. Again she still having diarrhea although it is not as much.   I think a dose of Remicade would not be about idea. I talked her about this. I went over the side effects. I think one dose of Remicade is not going to activate any hepatitis or cause a second cancer.  Her subcutaneous nodule on the anterior chest wall looks a little bit better. I will like to think that this is a indicated that the immunotherapy is helping.  Her labs looked pretty good. Her potassium is improving. She has good renal function. Her hemoglobin is 10.1. White cell count 7.2.  Her vital signs are pretty stable. On exam, she has no oral lesions. There is no scleral icterus. She has no adenopathy on the neck. Her lungs are clear on the left side. There is some decrease on the right side. Cardiac exam regular rate and rhythm with no murmurs, rubs or bruits. Abdomen is soft. Bowel sounds are hyperactive. She is slightly distended. There is some slight tenderness to palpation. Extremities shows no clubbing, cyanosis or edema.  We know that she has a C. difficile colitis. Again, 2 weeks ago everything was normal. As such, I do have to believe that some of the diarrhea is immune-based. I do think that a dose of Remicade is reasonable.  I would seriously consider having a right thoracentesis  done. I think would be informative to get the fluid out. It would be interesting to see if this helps with her vomiting.  Her progress is being made although it is slow. That is all right. The nursing staff is doing a fantastic job with her.  Lum Keas  Exodus 14:14

## 2015-10-05 NOTE — Progress Notes (Signed)
[facility] PROGRESS NOTE  Felicia Richmond [id number] DOB: March 07, 1961 DOA: 10/02/2015 PCP: Nance Pear., NP  Brief Summary  The patient is a 55 year old female with history of metastatic melanoma who is admitted 2 weeks prior to admission for diarrhea. At that time colonoscopy demonstrated lymphocytic colitis and she was placed on Entocort.  She presented with a three day history of worsening watery diarrhea and abdominal pain, particularly in the left upper and lower quadrant.  She denied recent antibiotics, fevers, chills.  She was hypokalemic.    Assessment/Plan  C. difficile diarrhea, risk factor was recent hospitalization -  Oral vancomycin, started on 2/8 -  Day 3  Hypoglycemia due to inability to tolerate PO -  Change IVF to D10 -  Hypoglycemia protocol prn -  Agree with CBG q6h  Nausea and vomiting, likely related to colitis and progressive malignancy -  Schedule zofran -  Continue prn phenergan -  Consider scopolamine patch once she has completed further tx for C. Diff colitis  Possible RLL consolidation and large right pleural effusion, doubt pneumonia since stable on RA, afebrile, and leukocytosis resolved with oral vancomycin for C. 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  - continue Entocort - Adjustments made to melanoma tx per Dr. Marin Olp - Remicade and steroids held due to C. Diff colitis  Metastatic melanoma, progression of disease on CT scan -  Management per Oncology  Hypokalemia due to diarrhea -  Magnesium 1.7 > give 2 gm IV magnesium -  Will give additional maintenance doses of calcium and magnesium today given severity of ongoing diarrhea and presumptive losses -  Continue potassium in IVF  Hypocalcemia (even with correction for albumin) -  Continue Calcium gluconate 1mg  IV once  Hyperthyroidism, stable, continue  methimazole  Leukocytosis, due to infection and trending down with oral vancomycin -  Trend WBC  Normocytic anemia, likely anemia of chronic disease due to malignancy and chemo, occult stool recently neg 09/19/2015 -  Repeat hgb in AM  Diet:  FLD Access:  PIV IVF:  yes Proph:  lovenox (pt high risk for DVT)  Code Status:  full Family Communication: patient alone Disposition Plan:  Pending improvement in diarrhea, tolerating adequate PO to stay hydrated, likely home in several more days   Consultants:  Oncology, Dr. Marin Olp  Procedures:  CT abd/pelvis  Antibiotics:  Oral vancomycin 2/8    HPI/Subjective:  Continues to have copious watery diarrhea unchanged in frequency, consistency with severe abdominal pain and nausea. She is still incontinent of stool. Her abdominal pain is improved since yesterday.   Objective: Filed Vitals:   10/04/15 0649 10/04/15 1335 10/04/15 2022 10/05/15 0615  BP: 123/61 128/67 99/57 117/60  Pulse: 86 91 85 72  Temp: 98.6 F (37 C) 98.5 F (36.9 C) 97.7 F (36.5 C) 97.7 F (36.5 C)  TempSrc: Oral Oral Oral Oral  Resp:   18 18  Height:      Weight:      SpO2: 94% 90% 94% 96%    Intake/Output Summary (Last 24 hours) at 10/05/15 0833 Last data filed at 10/05/15 [id number]  Gross per 24 hour  Intake 1187.5 ml  Output    450 ml  Net  737.5 ml   Filed Weights   10/02/15 2104 10/03/15 1420  Weight: 64.3 kg (141 lb 12.1 oz) 66.1 kg (145 lb 11.6 oz)   Body mass index is 27.55 kg/(m^2).  Exam:   General:  Adult female, No acute distress but ill-appearing  HEENT:  NCAT, MMM  Cardiovascular:  RRR, nl S1, S2 no mrg, 2+ pulses, warm extremities  Respiratory:  CTAB, no increased WOB  Abdomen:   Hyperactive BS, soft, TTP in the LUQ and LLQ and epigastrium to moderate palpation, no rebound or guarding  MSK:   Normal tone and bulk, no LEE  Data Reviewed: Basic Metabolic Panel:  Recent Labs Lab 10/02/15 1707 10/03/15 0630  10/04/15 0610 10/05/15 0530  NA 129* 130* 129* 131*  K 2.9* 3.4* 3.5 3.7  CL 94* 100* 100* 103  CO2 23 22 22 22   GLUCOSE 76 91 105* 78  BUN 10 7 7  5*  CREATININE 0.61 0.59 0.64 0.46  CALCIUM 7.4* 6.6* 7.0* 7.3*  MG 1.7  --  2.0 1.9  PHOS  --   --  2.3* 2.9   Liver Function Tests:  Recent Labs Lab 10/02/15 1707 10/03/15 0630 10/04/15 0610  AST 20 18 15   ALT 9* 7* 9*  ALKPHOS 60 56 64  BILITOT 0.8 0.6 0.5  PROT 5.0* 4.3* 4.3*  ALBUMIN 2.3* 2.0* 1.9*    Recent Labs Lab 10/02/15 1707  LIPASE 19   No results for input(s): AMMONIA in the last 168 hours. CBC:  Recent Labs Lab 10/02/15 1707 10/03/15 0630 10/04/15 0610 10/05/15 0530  WBC 15.9* 13.2* 8.1 7.2  NEUTROABS  --  9.7* 5.6  --   HGB 11.5* 10.7* 10.7* 10.1*  HCT 36.3 34.2* 34.1* 32.2*  MCV 79.6 82.2 82.4 82.4  PLT 262 247 235 221    Recent Results (from the past 240 hour(s))  Gastrointestinal Panel by PCR , Stool     Status: None   Collection Time: 10/02/15  7:57 PM  Result Value Ref Range Status   Campylobacter species NOT DETECTED NOT DETECTED Final   Plesimonas shigelloides NOT DETECTED NOT DETECTED Final   Salmonella species NOT DETECTED NOT DETECTED Final   Yersinia enterocolitica NOT DETECTED NOT DETECTED Final   Vibrio species NOT DETECTED NOT DETECTED Final   Vibrio cholerae NOT DETECTED NOT DETECTED Final   Enteroaggregative E coli (EAEC) NOT DETECTED NOT DETECTED Final   Enteropathogenic E coli (EPEC) NOT DETECTED NOT DETECTED Final   Enterotoxigenic E coli (ETEC) NOT DETECTED NOT DETECTED Final   Shiga like toxin producing E coli (STEC) NOT DETECTED NOT DETECTED Final   E. coli O157 NOT DETECTED NOT DETECTED Final   Shigella/Enteroinvasive E coli (EIEC) NOT DETECTED NOT DETECTED Final   Cryptosporidium NOT DETECTED NOT DETECTED Final   Cyclospora cayetanensis NOT DETECTED NOT DETECTED Final   Entamoeba histolytica NOT DETECTED NOT DETECTED Final   Giardia lamblia NOT DETECTED NOT  DETECTED Final   Adenovirus F40/41 NOT DETECTED NOT DETECTED Final   Astrovirus NOT DETECTED NOT DETECTED Final   Norovirus GI/GII NOT DETECTED NOT DETECTED Final   Rotavirus A NOT DETECTED NOT DETECTED Final   Sapovirus (I, II, IV, and V) NOT DETECTED NOT DETECTED Final  C difficile quick scan w PCR reflex     Status: Abnormal   Collection Time: 10/03/15 12:45 PM  Result Value Ref Range Status   C Diff antigen POSITIVE (A) NEGATIVE Final   C Diff toxin POSITIVE (A) NEGATIVE Final   C Diff interpretation Positive for toxigenic C. difficile  Final    Comment: CRITICAL RESULT CALLED TO, READ BACK BY AND VERIFIED WITH: KIM CHEEK RN 2.8.17 @ 1535 BY RICEJ   MRSA  PCR Screening     Status: None   Collection Time: 10/03/15  2:30 PM  Result Value Ref Range Status   MRSA by PCR NEGATIVE NEGATIVE Final    Comment:        The GeneXpert MRSA Assay (FDA approved for NASAL specimens only), is one component of a comprehensive MRSA colonization surveillance program. It is not intended to diagnose MRSA infection nor to guide or monitor treatment for MRSA infections.      Studies: Ct Abdomen Pelvis W Contrast  10/03/2015  CLINICAL DATA:  Generalized abdominal pain for 5 days, known history of metastatic melanoma EXAM: CT ABDOMEN AND PELVIS WITH CONTRAST TECHNIQUE: Multidetector CT imaging of the abdomen and pelvis was performed using the standard protocol following bolus administration of intravenous contrast. CONTRAST:  158mL OMNIPAQUE IOHEXOL 300 MG/ML  SOLN COMPARISON:  07/27/2015 FINDINGS: The lung bases demonstrate large right-sided pleural effusion and small left-sided pleural effusion. Right lower lobe consolidation is noted. In the right breast there is again noted a lobulated peripherally enhancing mass lesion which has grown in the interval from the prior exam. It now measures 7.3 x 5.5 cm. It previously measured 5.3 cm in greatest dimension. The liver is within normal limits. An area of  focal fatty sparing is seen. The gallbladder is been surgically removed. Large right-sided adrenal mass lesion is again identified with peripheral enhancement. It now measures 12 cm in greatest dimension which is in increased from 8.1 cm on the prior exam. The left adrenal lesion now measures 11.1 cm also increased from previous measuring 9.2 cm. The spleen and pancreas are within normal limits. The metastatic lesion in the mesenteric root now measures 6 cm best seen on image number 34 series 2. This is stable from the prior exam. A pericolonic implant is noted adjacent to the proximal transverse colon measuring 3.4 cm. This is also roughly stable from the prior exam. A few scattered small peritoneal implants are noted which are slightly larger than that seen on the prior exam. No obstructive changes are noted within the bowel. The uterus and bladder appear within normal limits. Mild free fluid is noted within the abdomen and pelvis. Diffuse subcutaneous edema is noted in the abdominal wall consistent with anasarca. Mild compression upon the proximal abdominal aorta is noted secondary to the adrenal masses. A metastatic deposit is again noted in the left rectus muscle stable from the previous exam. No bony metastatic lesions are seen. IMPRESSION: Progression in the degree of diffuse metastatic disease consistent with the patient's given clinical history. Enlargement of bilateral adrenal masses as well as a right breast mass and scattered peritoneal implants is noted. New third spacing of fluid is noted within the abdominal wall as well as new mild free fluid within the abdomen and pelvis. No other acute abnormality is noted Electronically Signed   By: Inez Catalina M.D.   On: 10/03/2015 13:47    Scheduled Meds: . budesonide  3 mg Oral Daily  . dronabinol  2.5 mg Oral BID AC  . famotidine  20 mg Oral Daily  . fluconazole  100 mg Oral Daily  . inFLIXimab (REMICADE) infusion  5 mg/kg Intravenous Once  .  methimazole  5 mg Oral Daily  . morphine  30 mg Oral Q12H  . ondansetron (ZOFRAN) IV  4 mg Intravenous 4 times per day  . saccharomyces boulardii  250 mg Oral BID  . vancomycin  125 mg Oral QID   Continuous Infusions: . D-10-0.45% Sodium Chloride  with KCL 40 meq/L 1000 ml      Active Problems:   Hyperthyroidism   Metastatic melanoma (HCC)   Diarrhea   Hypokalemia   Abdominal pain   Malnutrition of moderate degree   C. difficile diarrhea    Time spent: 30 min    Salia Cangemi, [facility] Pager [phone]. If 7PM-7AM, please contact night-coverage at [email or web address], password [facility] 10/05/2015, 8:33 AM  LOS: 3 days

## 2015-10-05 NOTE — Progress Notes (Signed)
Nursing Note: Pt gagging and wretching w/ emesis that was clear w/ small amount of blood noted.A: Pt medicated w/ prn [email or web address].[email or web address]

## 2015-10-06 LAB — BASIC METABOLIC PANEL
ANION GAP: 7 (ref 5–15)
BUN: 6 mg/dL (ref 6–20)
CHLORIDE: 102 mmol/L (ref 101–111)
CO2: 20 mmol/L — AB (ref 22–32)
Calcium: 6.9 mg/dL — ABNORMAL LOW (ref 8.9–10.3)
Creatinine, Ser: 0.65 mg/dL (ref 0.44–1.00)
GFR calc Af Amer: >60 mL/min (ref >60)
GLUCOSE: 76 mg/dL (ref 65–99)
POTASSIUM: 3.7 mmol/L (ref 3.5–5.1)
Sodium: 129 mmol/L — ABNORMAL LOW (ref 135–145)

## 2015-10-06 LAB — GLUCOSE, CAPILLARY
GLUCOSE-CAPILLARY: 78 mg/dL (ref 65–99)
Glucose-Capillary: 91 mg/dL (ref 65–99)
Glucose-Capillary: 85 mg/dL (ref 65–99)
Glucose-Capillary: 84 mg/dL (ref 65–99)

## 2015-10-06 LAB — MAGNESIUM: Magnesium: 2 mg/dL (ref 1.7–2.4)

## 2015-10-06 LAB — CBC
HEMATOCRIT: 34.6 % — AB (ref 36.0–46.0)
HEMOGLOBIN: 10.9 g/dL — AB (ref 12.0–15.0)
MCH: 25.8 pg — AB (ref 26.0–34.0)
MCHC: 31.5 g/dL (ref 30.0–36.0)
MCV: 82 fL (ref 78.0–100.0)
Platelets: 237 10*3/uL (ref 150–400)
RBC: 4.22 MIL/uL (ref 3.87–5.11)
RDW: 19.2 % — ABNORMAL HIGH (ref 11.5–15.5)
WBC: 6.3 10*3/uL (ref 4.0–10.5)

## 2015-10-06 LAB — PHOSPHORUS: Phosphorus: 2.6 mg/dL (ref 2.5–4.6)

## 2015-10-06 MED ORDER — CALCIUM CARBONATE ANTACID 500 MG PO CHEW
2.0000 | CHEWABLE_TABLET | Freq: Three times a day (TID) | ORAL | Status: DC
  Administered 2015-10-06 – 2015-10-07 (×3): 400 mg via ORAL
  Filled 2015-10-06 (×3): qty 2

## 2015-10-06 MED ORDER — MORPHINE SULFATE 15 MG PO TABS
15.0000 mg | ORAL_TABLET | ORAL | Status: DC | PRN
  Administered 2015-10-09 – 2015-10-22 (×7): 15 mg via ORAL
  Filled 2015-10-06 (×8): qty 1

## 2015-10-06 MED ORDER — POTASSIUM PHOSPHATE MONOBASIC 500 MG PO TABS
500.0000 mg | ORAL_TABLET | Freq: Three times a day (TID) | ORAL | Status: DC
  Administered 2015-10-06 – 2015-10-07 (×5): 500 mg via ORAL
  Filled 2015-10-06 (×8): qty 1

## 2015-10-06 MED ORDER — SODIUM CHLORIDE 0.9 % IV SOLN
2.0000 g | Freq: Once | INTRAVENOUS | Status: AC
  Administered 2015-10-06: 2 g via INTRAVENOUS
  Filled 2015-10-06: qty 20

## 2015-10-06 MED ORDER — METRONIDAZOLE IN NACL 5-0.79 MG/ML-% IV SOLN
500.0000 mg | Freq: Three times a day (TID) | INTRAVENOUS | Status: DC
  Administered 2015-10-06 – 2015-10-15 (×28): 500 mg via INTRAVENOUS
  Filled 2015-10-06 (×29): qty 100

## 2015-10-06 MED ORDER — HALOPERIDOL LACTATE 5 MG/ML IJ SOLN
0.5000 mg | Freq: Three times a day (TID) | INTRAMUSCULAR | Status: AC
  Administered 2015-10-06 (×3): 0.5 mg via INTRAVENOUS
  Filled 2015-10-06 (×3): qty 1

## 2015-10-06 MED ORDER — LORAZEPAM 2 MG/ML IJ SOLN: 1.0000 mg | Freq: Every evening | INTRAMUSCULAR | Status: DC | PRN

## 2015-10-06 MED ORDER — VANCOMYCIN 50 MG/ML ORAL SOLUTION
500.0000 mg | Freq: Four times a day (QID) | ORAL | Status: DC
  Administered 2015-10-06 – 2015-10-14 (×32): 500 mg via ORAL
  Filled 2015-10-06 (×41): qty 10

## 2015-10-06 MED ORDER — SODIUM CHLORIDE 0.9 % IV SOLN
8.0000 mg | Freq: Three times a day (TID) | INTRAVENOUS | Status: DC
  Administered 2015-10-06 – 2015-10-14 (×24): 8 mg via INTRAVENOUS
  Filled 2015-10-06 (×27): qty 4

## 2015-10-06 NOTE — Progress Notes (Signed)
[facility] PROGRESS NOTE  Felicia Richmond [id number] DOB: 1961/01/20 DOA: 10/02/2015 PCP: Nance Pear., NP  Brief Summary  The patient is a 55 year old female with history of metastatic melanoma who is admitted 2 weeks prior to admission for diarrhea. At that time colonoscopy demonstrated lymphocytic colitis and she was placed on Entocort.  She presented with a three day history of worsening watery diarrhea and abdominal pain, particularly in the left upper and lower quadrant.  She denied recent antibiotics, fevers, chills.  She was hypokalemic.    Assessment/Plan  C. difficile diarrhea, risk factor was recent hospitalization, persistent severe diarrhea, not improved -  Increase vancomycin to 500mg  dose and add flagyl -  Rectal tube due to skin breakdown around bottom and to quantify diarrhea -  Oral vancomycin started on 2/8 -  Continue IVF  Hypoglycemia due to inability to tolerate PO - increase rate of D10 - Hypoglycemia protocol prn - Agree with CBG q6h  Nausea and vomiting and abdominal pain, likely related to colitis and progressive malignancy - increase zofran to 8mg  IV q8h -  D/c phenergan -  Add haldol x 3 doses -  Increase morphine frequency  Possible RLL consolidation and large right pleural effusion, doubt pneumonia since stable on RA, afebrile, and leukocytosis resolved with oral vancomycin for C. 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  - continue Entocort - Adjustments made to melanoma tx per Dr. Marin Olp - Remicade and steroids held due to C. Diff colitis  Metastatic melanoma, progression of disease on CT scan - Management per Oncology  Hypokalemia due to diarrhea, improving  -  Magnesium 2 today - Continue potassium in IVF  Hypocalcemia  (even with correction for albumin) - Calcium 2gm IV once -  Schedule tums  Hyperthyroidism, stable, continue methimazole  Leukocytosis, due to infection and trending down with oral vancomycin - Trend WBC  Normocytic anemia, likely anemia of chronic disease due to malignancy and chemo, occult stool recently neg 09/19/2015 - Repeat hgb in AM  Diet:  FLD Access:  PIV IVF:  yes Proph:  lovenox  Code Status:  full Family Communication: patient alone Disposition Plan:  Pending improvement in diarrhea, tolerating adequate PO to stay hydrated, likely home in several more days   Consultants:  Oncology, Dr. Marin Olp  Procedures:  CT abd/pelvis  Antibiotics:  Oral vancomycin 2/8    HPI/Subjective:  Continues to have copious watery diarrhea unchanged in frequency, consistency. She is still incontinent of stool. Her abdominal pain remains.  She is unable to tolerate much PO due to nausea and vomiting.    Objective: Filed Vitals:   10/05/15 1210 10/05/15 1346 10/05/15 2122 10/06/15 0648  BP: 120/69 125/61 127/68 105/43  Pulse:  81 86 81  Temp:  97.8 F (36.6 C) 97.4 F (36.3 C) 97.7 F (36.5 C)  TempSrc:  Oral Oral Oral  Resp:  18 18 18   Height:      Weight:      SpO2:  97% 92% 100%    Intake/Output Summary (Last 24 hours) at 10/06/15 0843 Last data filed at 10/06/15 0557  Gross per 24 hour  Intake    950 ml  Output      0 ml  Net    950 ml   Filed Weights   10/02/15 2104 10/03/15 1420  Weight:  64.3 kg (141 lb 12.1 oz) 66.1 kg (145 lb 11.6 oz)   Body mass index is 27.55 kg/(m^2).  Exam:   General:  Adult female, No acute distress but ill-appearing  HEENT:  NCAT, MMM  Cardiovascular:  RRR, nl S1, S2 no mrg, 2+ pulses, warm extremities  Respiratory:  CTAB, no increased WOB  Abdomen:   Hyperactive BS, soft, TTP in the LUQ and LLQ and epigastrium to moderate palpation, no rebound or guarding  MSK:   Normal tone and bulk, no LEE  Data Reviewed: Basic  Metabolic Panel:  Recent Labs Lab 10/02/15 1707 10/03/15 0630 10/04/15 0610 10/05/15 0530 10/06/15 0511  NA 129* 130* 129* 131* 129*  K 2.9* 3.4* 3.5 3.7 3.7  CL 94* 100* 100* 103 102  CO2 23 22 22 22  20*  GLUCOSE 76 91 105* 78 76  BUN 10 7 7  5* 6  CREATININE 0.61 0.59 0.64 0.46 0.65  CALCIUM 7.4* 6.6* 7.0* 7.3* 6.9*  MG 1.7  --  2.0 1.9 2.0  PHOS  --   --  2.3* 2.9 2.6   Liver Function Tests:  Recent Labs Lab 10/02/15 1707 10/03/15 0630 10/04/15 0610  AST 20 18 15   ALT 9* 7* 9*  ALKPHOS 60 56 64  BILITOT 0.8 0.6 0.5  PROT 5.0* 4.3* 4.3*  ALBUMIN 2.3* 2.0* 1.9*    Recent Labs Lab 10/02/15 1707  LIPASE 19   No results for input(s): AMMONIA in the last 168 hours. CBC:  Recent Labs Lab 10/02/15 1707 10/03/15 0630 10/04/15 0610 10/05/15 0530 10/06/15 0511  WBC 15.9* 13.2* 8.1 7.2 6.3  NEUTROABS  --  9.7* 5.6  --   --   HGB 11.5* 10.7* 10.7* 10.1* 10.9*  HCT 36.3 34.2* 34.1* 32.2* 34.6*  MCV 79.6 82.2 82.4 82.4 82.0  PLT 262 247 235 221 237    Recent Results (from the past 240 hour(s))  Gastrointestinal Panel by PCR , Stool     Status: None   Collection Time: 10/02/15  7:57 PM  Result Value Ref Range Status   Campylobacter species NOT DETECTED NOT DETECTED Final   Plesimonas shigelloides NOT DETECTED NOT DETECTED Final   Salmonella species NOT DETECTED NOT DETECTED Final   Yersinia enterocolitica NOT DETECTED NOT DETECTED Final   Vibrio species NOT DETECTED NOT DETECTED Final   Vibrio cholerae NOT DETECTED NOT DETECTED Final   Enteroaggregative E coli (EAEC) NOT DETECTED NOT DETECTED Final   Enteropathogenic E coli (EPEC) NOT DETECTED NOT DETECTED Final   Enterotoxigenic E coli (ETEC) NOT DETECTED NOT DETECTED Final   Shiga like toxin producing E coli (STEC) NOT DETECTED NOT DETECTED Final   E. coli O157 NOT DETECTED NOT DETECTED Final   Shigella/Enteroinvasive E coli (EIEC) NOT DETECTED NOT DETECTED Final   Cryptosporidium NOT DETECTED NOT  DETECTED Final   Cyclospora cayetanensis NOT DETECTED NOT DETECTED Final   Entamoeba histolytica NOT DETECTED NOT DETECTED Final   Giardia lamblia NOT DETECTED NOT DETECTED Final   Adenovirus F40/41 NOT DETECTED NOT DETECTED Final   Astrovirus NOT DETECTED NOT DETECTED Final   Norovirus GI/GII NOT DETECTED NOT DETECTED Final   Rotavirus A NOT DETECTED NOT DETECTED Final   Sapovirus (I, II, IV, and V) NOT DETECTED NOT DETECTED Final  C difficile quick scan w PCR reflex     Status: Abnormal   Collection Time: 10/03/15 12:45 PM  Result Value Ref Range Status   C Diff antigen POSITIVE (A) NEGATIVE Final   C  Diff toxin POSITIVE (A) NEGATIVE Final   C Diff interpretation Positive for toxigenic C. difficile  Final    Comment: CRITICAL RESULT CALLED TO, READ BACK BY AND VERIFIED WITH: KIM CHEEK RN 2.8.17 @ 1535 BY RICEJ   MRSA PCR Screening     Status: None   Collection Time: 10/03/15  2:30 PM  Result Value Ref Range Status   MRSA by PCR NEGATIVE NEGATIVE Final    Comment:        The GeneXpert MRSA Assay (FDA approved for NASAL specimens only), is one component of a comprehensive MRSA colonization surveillance program. It is not intended to diagnose MRSA infection nor to guide or monitor treatment for MRSA infections.      Studies: Dg Chest 1 View  10/05/2015  CLINICAL DATA:  Status post thoracentesis on the right EXAM: CHEST 1 VIEW COMPARISON:  September 18, 2015 ; CT abdomen and pelvis including lung bases October 03, 2015 ; chest CT July 27, 2015 FINDINGS: There is no appreciable pneumothorax. Right effusion is smaller post thoracentesis. There is a small residual right pleural effusion with right base atelectatic change. Lungs elsewhere clear. Heart size and pulmonary vascularity are normal. There is soft tissue fullness overlying the right region, felt to represent adenopathy. Recent CT demonstrated anterior mediastinal adenopathy toward the right. No bone lesions. Port-A-Cath tip  is near the cavoatrial junction. IMPRESSION: No demonstrable pneumothorax. Small right effusion with right base atelectasis/ consolidation. Stable adenopathy overlying the right hilum, demonstrated anteriorly on CT scan 2 months prior. Left lung clear. Electronically Signed   By: Lowella Grip III M.D.   On: 10/05/2015 12:37   US Thoracentesis Asp Pleural Space W/img Guide  10/05/2015  INDICATION: Right pleural effusion. History of metastatic melanoma. Request diagnostic and therapeutic thoracentesis EXAM: ULTRASOUND GUIDED RIGHT THORACENTESIS MEDICATIONS: None. COMPLICATIONS: None immediate. PROCEDURE: An ultrasound guided thoracentesis was thoroughly discussed with the patient and questions answered. The benefits, risks, alternatives and complications were also discussed. The patient understands and wishes to proceed with the procedure. Written consent was obtained. Ultrasound was performed to localize and mark an adequate pocket of fluid in the right chest. The area was then prepped and draped in the normal sterile fashion. 1% Lidocaine was used for local anesthesia. Under ultrasound guidance a Safe-T-Centesis catheter was introduced. Thoracentesis was performed. The catheter was removed and a dressing applied. FINDINGS: A total of approximately 500 mL of clear yellow fluid was removed. Samples were sent to the laboratory as requested by the clinical team. IMPRESSION: Successful ultrasound guided right thoracentesis yielding 500 mL of pleural fluid. Read by: Ascencion Dike PA-C Electronically Signed   By: Aletta Edouard M.D.   On: 10/05/2015 12:52    Scheduled Meds: . budesonide  3 mg Oral Daily  . dronabinol  2.5 mg Oral BID AC  . enoxaparin (LOVENOX) injection  40 mg Subcutaneous Q24H  . famotidine (PEPCID) IV  20 mg Intravenous BID  . feeding supplement  1 Container Oral TID BM  . fluconazole  100 mg Oral Daily  . methimazole  5 mg Oral Daily  . metronidazole  500 mg Intravenous Q8H  .  morphine  30 mg Oral Q12H  . ondansetron (ZOFRAN) IV  4 mg Intravenous 4 times per day  . saccharomyces boulardii  250 mg Oral BID  . vancomycin  500 mg Oral 4 times per day   Continuous Infusions: . D-10-0.45% Sodium Chloride with KCL 40 meq/L 1000 ml 75 mL/hr at 10/06/15 0533  Active Problems:   Hyperthyroidism   Metastatic melanoma ([place])   Diarrhea   Hypokalemia   Abdominal pain   Malnutrition of moderate degree   C. difficile diarrhea    Time spent: 30 min    Pebble Botkin, [facility]  [facility] Pager [phone]. If 7PM-7AM, please contact night-coverage at [email or web address], password [facility] 10/06/2015, 8:43 AM  LOS: 4 days

## 2015-10-07 LAB — BASIC METABOLIC PANEL
Anion gap: 7 (ref 5–15)
CALCIUM: 6.9 mg/dL — AB (ref 8.9–10.3)
CO2: 17 mmol/L — AB (ref 22–32)
CREATININE: 0.82 mg/dL (ref 0.44–1.00)
Chloride: 104 mmol/L (ref 101–111)
GFR calc non Af Amer: >60 mL/min (ref >60)
Glucose, Bld: 157 mg/dL — ABNORMAL HIGH (ref 65–99)
Potassium: 4.3 mmol/L (ref 3.5–5.1)
SODIUM: 128 mmol/L — AB (ref 135–145)

## 2015-10-07 LAB — GLUCOSE, CAPILLARY
GLUCOSE-CAPILLARY: 125 mg/dL — AB (ref 65–99)
GLUCOSE-CAPILLARY: 87 mg/dL (ref 65–99)
Glucose-Capillary: 114 mg/dL — ABNORMAL HIGH (ref 65–99)
Glucose-Capillary: 110 mg/dL — ABNORMAL HIGH (ref 65–99)
Glucose-Capillary: 100 mg/dL — ABNORMAL HIGH (ref 65–99)

## 2015-10-07 LAB — MAGNESIUM: MAGNESIUM: 1.7 mg/dL (ref 1.7–2.4)

## 2015-10-07 LAB — CBC
HCT: 35.3 % — ABNORMAL LOW (ref 36.0–46.0)
Hemoglobin: 11.2 g/dL — ABNORMAL LOW (ref 12.0–15.0)
MCH: 25.4 pg — AB (ref 26.0–34.0)
MCHC: 31.7 g/dL (ref 30.0–36.0)
MCV: 80 fL (ref 78.0–100.0)
PLATELETS: 210 10*3/uL (ref 150–400)
RBC: 4.41 MIL/uL (ref 3.87–5.11)
RDW: 18.8 % — AB (ref 11.5–15.5)
WBC: 6.3 10*3/uL (ref 4.0–10.5)

## 2015-10-07 LAB — PHOSPHORUS: Phosphorus: 2.3 mg/dL — ABNORMAL LOW (ref 2.5–4.6)

## 2015-10-07 MED ORDER — POTASSIUM PHOSPHATE MONOBASIC 500 MG PO TABS
1000.0000 mg | ORAL_TABLET | Freq: Three times a day (TID) | ORAL | Status: DC
  Administered 2015-10-07 (×2): 1000 mg via ORAL
  Filled 2015-10-07 (×7): qty 2

## 2015-10-07 MED ORDER — SODIUM CHLORIDE 0.9 % IV SOLN
4.0000 g | Freq: Once | INTRAVENOUS | Status: AC
  Administered 2015-10-07: 4 g via INTRAVENOUS
  Filled 2015-10-07: qty 40

## 2015-10-07 MED ORDER — MAGNESIUM SULFATE IN D5W 10-5 MG/ML-% IV SOLN
1.0000 g | Freq: Once | INTRAVENOUS | Status: AC
  Administered 2015-10-07: 1 g via INTRAVENOUS
  Filled 2015-10-07: qty 100

## 2015-10-07 MED ORDER — CALCIUM CARBONATE ANTACID 500 MG PO CHEW
3.0000 | CHEWABLE_TABLET | Freq: Three times a day (TID) | ORAL | Status: DC
  Administered 2015-10-07 (×2): 600 mg via ORAL
  Filled 2015-10-07 (×2): qty 3

## 2015-10-07 NOTE — Progress Notes (Signed)
Pt voided 25 ml this AM,has no feeling of needing to urinate. Bladder scanned pt for 628 ml. Dr. Sheran Fava called and orders given for foley insert, pt agreed.

## 2015-10-07 NOTE — Progress Notes (Signed)
[facility] PROGRESS NOTE  Felicia Richmond [id number] DOB: Nov 17, 1960 DOA: 10/02/2015 PCP: Nance Pear., NP  Brief Summary  The patient is a 55 year old female with history of metastatic melanoma who is admitted 2 weeks prior to admission for diarrhea. At that time colonoscopy demonstrated lymphocytic colitis and she was placed on Entocort.  She presented with a three day history of worsening watery diarrhea and abdominal pain, particularly in the left upper and lower quadrant.  She denied recent antibiotics, fevers, chills.  She was hypokalemic.    Assessment/Plan  C. difficile diarrhea, risk factor was recent hospitalization, persistent severe diarrhea, not improved -  Continue vancomycin to 500mg  dose and add flagyl -  Continue florastor -  Continue Rectal tube due to skin breakdown around bottom and to quantify diarrhea -  Oral vancomycin started on 2/8 -  Increase IVF  Hypoglycemia due to inability to tolerate PO - Increase rate of D10 - Hypoglycemia protocol prn - Agree with CBG q6h  Nausea and vomiting and abdominal pain, likely related to colitis and progressive malignancy - Continue zofran 8mg  IV q8h -  Resume prn phenergan -  Increase morphine IR frequency -  Continue MS contin  Possible RLL consolidation and large right pleural effusion, doubt pneumonia since stable on RA, afebrile, and leukocytosis resolved with oral vancomycin for C. diff - Thoracentesis on 2/10:  534mL yellow fluid removed - Continue incentive spirometry  -  F/u cytology from thoracentesis  Recent biopsy-proven lymphocytic colitis attributed to her immunotherapy for melanoma.  - continue Entocort - Adjustments made to melanoma tx per Dr. Marin Olp - Remicade and steroids held due to C. Diff colitis  Metastatic melanoma, progression of disease on CT scan - Management per Oncology  Hyponatremia, iatrogenic and asymptomatic -  Monitor  Hypokalemia due to diarrhea, stable -   Magnesium 2 today - Continue potassium in IVF  Hypocalcemia (even with correction for albumin) - Calcium 4gm IV once -  Increase tums  Hyperthyroidism, stable, continue methimazole  Leukocytosis, due to infection and trending down with oral vancomycin - Trend WBC  Normocytic anemia, likely anemia of chronic disease due to malignancy and chemo, occult stool recently neg 09/19/2015 - Repeat hgb in AM  Diet:  FLD Access:  PIV IVF:  yes Proph:  lovenox  Code Status:  full Family Communication: patient alone Disposition Plan:  Pending improvement in diarrhea, tolerating adequate PO to stay hydrated, likely home in several more days   Consultants:  Oncology, Dr. Marin Olp  Procedures:  CT abd/pelvis  Antibiotics:  Oral vancomycin 2/8    HPI/Subjective:  Continues to have copious watery diarrhea.  Already put out 1.4L of green watery diarrhea since this morning.  Vomiting improving but still having significant nausea.  Tolerating her pills and oral medications.    Objective: Filed Vitals:   10/06/15 0648 10/06/15 1500 10/06/15 2020 10/07/15 0605  BP: 105/43 118/60 123/58 105/59  Pulse: 81 88 110 102  Temp: 97.7 F (36.5 C) 98 F (36.7 C) 98.4 F (36.9 C) 97.8 F (36.6 C)  TempSrc: Oral Oral Oral Oral  Resp: 18 16 18 18   Height:      Weight:      SpO2: 100% 99% 96% 98%    Intake/Output Summary (Last 24 hours) at 10/07/15 1323 Last data filed at 10/07/15 0900  Gross per 24 hour  Intake   2588 ml  Output   2625 ml  Net    -37 ml   Filed Weights   10/02/15  2104 10/03/15 1420  Weight: 64.3 kg (141 lb 12.1 oz) 66.1 kg (145 lb 11.6 oz)   Body mass index is 27.55 kg/(m^2).  Exam:   General:  Adult female, No acute distress but ill-appearing  HEENT:  NCAT, MMM  Cardiovascular:  RRR, nl S1, S2 no mrg, 2+ pulses, warm extremities  Respiratory:  CTAB, no increased WOB  Abdomen:   Hyperactive BS, soft, TTP in the LUQ and LLQ and epigastrium to moderate  palpation, no rebound or guarding  MSK:   Normal tone and bulk, no LEE  Data Reviewed: Basic Metabolic Panel:  Recent Labs Lab 10/02/15 1707 10/03/15 0630 10/04/15 0610 10/05/15 0530 10/06/15 0511  NA 129* 130* 129* 131* 129*  K 2.9* 3.4* 3.5 3.7 3.7  CL 94* 100* 100* 103 102  CO2 23 22 22 22  20*  GLUCOSE 76 91 105* 78 76  BUN 10 7 7  5* 6  CREATININE 0.61 0.59 0.64 0.46 0.65  CALCIUM 7.4* 6.6* 7.0* 7.3* 6.9*  MG 1.7  --  2.0 1.9 2.0  PHOS  --   --  2.3* 2.9 2.6   Liver Function Tests:  Recent Labs Lab 10/02/15 1707 10/03/15 0630 10/04/15 0610  AST 20 18 15   ALT 9* 7* 9*  ALKPHOS 60 56 64  BILITOT 0.8 0.6 0.5  PROT 5.0* 4.3* 4.3*  ALBUMIN 2.3* 2.0* 1.9*    Recent Labs Lab 10/02/15 1707  LIPASE 19   No results for input(s): AMMONIA in the last 168 hours. CBC:  Recent Labs Lab 10/02/15 1707 10/03/15 0630 10/04/15 0610 10/05/15 0530 10/06/15 0511  WBC 15.9* 13.2* 8.1 7.2 6.3  NEUTROABS  --  9.7* 5.6  --   --   HGB 11.5* 10.7* 10.7* 10.1* 10.9*  HCT 36.3 34.2* 34.1* 32.2* 34.6*  MCV 79.6 82.2 82.4 82.4 82.0  PLT 262 247 235 221 237    Recent Results (from the past 240 hour(s))  Gastrointestinal Panel by PCR , Stool     Status: None   Collection Time: 10/02/15  7:57 PM  Result Value Ref Range Status   Campylobacter species NOT DETECTED NOT DETECTED Final   Plesimonas shigelloides NOT DETECTED NOT DETECTED Final   Salmonella species NOT DETECTED NOT DETECTED Final   Yersinia enterocolitica NOT DETECTED NOT DETECTED Final   Vibrio species NOT DETECTED NOT DETECTED Final   Vibrio cholerae NOT DETECTED NOT DETECTED Final   Enteroaggregative E coli (EAEC) NOT DETECTED NOT DETECTED Final   Enteropathogenic E coli (EPEC) NOT DETECTED NOT DETECTED Final   Enterotoxigenic E coli (ETEC) NOT DETECTED NOT DETECTED Final   Shiga like toxin producing E coli (STEC) NOT DETECTED NOT DETECTED Final   E. coli O157 NOT DETECTED NOT DETECTED Final    Shigella/Enteroinvasive E coli (EIEC) NOT DETECTED NOT DETECTED Final   Cryptosporidium NOT DETECTED NOT DETECTED Final   Cyclospora cayetanensis NOT DETECTED NOT DETECTED Final   Entamoeba histolytica NOT DETECTED NOT DETECTED Final   Giardia lamblia NOT DETECTED NOT DETECTED Final   Adenovirus F40/41 NOT DETECTED NOT DETECTED Final   Astrovirus NOT DETECTED NOT DETECTED Final   Norovirus GI/GII NOT DETECTED NOT DETECTED Final   Rotavirus A NOT DETECTED NOT DETECTED Final   Sapovirus (I, II, IV, and V) NOT DETECTED NOT DETECTED Final  C difficile quick scan w PCR reflex     Status: Abnormal   Collection Time: 10/03/15 12:45 PM  Result Value Ref Range Status   C Diff antigen POSITIVE (A)  NEGATIVE Final   C Diff toxin POSITIVE (A) NEGATIVE Final   C Diff interpretation Positive for toxigenic C. difficile  Final    Comment: CRITICAL RESULT CALLED TO, READ BACK BY AND VERIFIED WITH: KIM CHEEK RN 2.8.17 @ 1535 BY RICEJ   MRSA PCR Screening     Status: None   Collection Time: 10/03/15  2:30 PM  Result Value Ref Range Status   MRSA by PCR NEGATIVE NEGATIVE Final    Comment:        The GeneXpert MRSA Assay (FDA approved for NASAL specimens only), is one component of a comprehensive MRSA colonization surveillance program. It is not intended to diagnose MRSA infection nor to guide or monitor treatment for MRSA infections.      Studies: No results found.  Scheduled Meds: . budesonide  3 mg Oral Daily  . calcium carbonate  2 tablet Oral TID  . dronabinol  2.5 mg Oral BID AC  . enoxaparin (LOVENOX) injection  40 mg Subcutaneous Q24H  . famotidine (PEPCID) IV  20 mg Intravenous BID  . feeding supplement  1 Container Oral TID BM  . fluconazole  100 mg Oral Daily  . methimazole  5 mg Oral Daily  . metronidazole  500 mg Intravenous Q8H  . morphine  30 mg Oral Q12H  . ondansetron (ZOFRAN) IV  8 mg Intravenous 3 times per day  . potassium phosphate (monobasic)  500 mg Oral TID WC &  HS  . saccharomyces boulardii  250 mg Oral BID  . vancomycin  500 mg Oral 4 times per day   Continuous Infusions: . D-10-0.45% Sodium Chloride with KCL 40 meq/L 1000 ml 100 mL/hr at 10/07/15 0535    Active Problems:   Hyperthyroidism   Metastatic melanoma ([place])   Diarrhea   Hypokalemia   Abdominal pain   Malnutrition of moderate degree   C. difficile diarrhea    Time spent: 30 min    Alixandra Alfieri, [facility]  [facility] Pager [phone]. If 7PM-7AM, please contact night-coverage at [email or web address], password [facility] 10/07/2015, 1:23 PM  LOS: 5 days

## 2015-10-08 DIAGNOSIS — R399 Unspecified symptoms and signs involving the genitourinary system: Secondary | ICD-10-CM

## 2015-10-08 LAB — BASIC METABOLIC PANEL
ANION GAP: 6 (ref 5–15)
BUN: <5 mg/dL — ABNORMAL LOW (ref 6–20)
CHLORIDE: 104 mmol/L (ref 101–111)
CO2: 18 mmol/L — AB (ref 22–32)
Calcium: 7.1 mg/dL — ABNORMAL LOW (ref 8.9–10.3)
Creatinine, Ser: 0.8 mg/dL (ref 0.44–1.00)
GFR calc non Af Amer: >60 mL/min (ref >60)
Glucose, Bld: 123 mg/dL — ABNORMAL HIGH (ref 65–99)
POTASSIUM: 4.6 mmol/L (ref 3.5–5.1)
Sodium: 128 mmol/L — ABNORMAL LOW (ref 135–145)

## 2015-10-08 LAB — CBC
HEMATOCRIT: 36.9 % (ref 36.0–46.0)
HEMOGLOBIN: 11.5 g/dL — AB (ref 12.0–15.0)
MCH: 25.4 pg — AB (ref 26.0–34.0)
MCHC: 31.2 g/dL (ref 30.0–36.0)
MCV: 81.6 fL (ref 78.0–100.0)
Platelets: 210 10*3/uL (ref 150–400)
RBC: 4.52 MIL/uL (ref 3.87–5.11)
RDW: 19.1 % — ABNORMAL HIGH (ref 11.5–15.5)
WBC: 5.7 10*3/uL (ref 4.0–10.5)

## 2015-10-08 LAB — GLUCOSE, CAPILLARY
GLUCOSE-CAPILLARY: 88 mg/dL (ref 65–99)
GLUCOSE-CAPILLARY: 89 mg/dL (ref 65–99)
GLUCOSE-CAPILLARY: 97 mg/dL (ref 65–99)
Glucose-Capillary: 117 mg/dL — ABNORMAL HIGH (ref 65–99)

## 2015-10-08 LAB — MAGNESIUM: Magnesium: 1.8 mg/dL (ref 1.7–2.4)

## 2015-10-08 LAB — PHOSPHORUS: Phosphorus: 2.1 mg/dL — ABNORMAL LOW (ref 2.5–4.6)

## 2015-10-08 MED ORDER — SODIUM CHLORIDE 0.9 % IV BOLUS (SEPSIS)
1000.0000 mL | Freq: Once | INTRAVENOUS | Status: AC
  Administered 2015-10-08: 1000 mL via INTRAVENOUS

## 2015-10-08 MED ORDER — SODIUM CHLORIDE 0.9 % IV SOLN: 5.0000 mg/kg | Freq: Once | INTRAVENOUS | Status: DC

## 2015-10-08 MED ORDER — LORAZEPAM 2 MG/ML IJ SOLN
1.0000 mg | Freq: Every day | INTRAMUSCULAR | Status: DC
  Administered 2015-10-08 – 2015-10-10 (×3): 1 mg via INTRAVENOUS
  Filled 2015-10-08 (×4): qty 1

## 2015-10-08 MED ORDER — PROMETHAZINE HCL 25 MG/ML IJ SOLN
12.5000 mg | Freq: Four times a day (QID) | INTRAMUSCULAR | Status: DC | PRN
  Administered 2015-10-09 – 2015-10-11 (×2): 12.5 mg via INTRAVENOUS
  Filled 2015-10-08 (×2): qty 1

## 2015-10-08 NOTE — Consult Note (Signed)
[facility] Consult Note  Referring Provider: No ref. provider found Primary Care Physician:  Nance Pear., NP Primary Gastroenterologist:  Dr.  Laurel Dimmer Complaint:  diarrhea HPI: Felicia Richmond is an 55 y.o. white female  who has developed refractory diarrhea thought secondary to immunotherapy for multiple myeloma with ipilimumab. This was initially treated with corticosteroids that has been refractory and during her recent admission she underwent colonoscopy which was endoscopically normal but biopsies showed lymphocytic colitis. She was started on budesonide 9 mg a day but her diarrhea did not improve and she was readmitted on February 11 due to persistent watery diarrhea without any blood associated with dehydration and hypokalemia. On her current admission she was found to have a positive C. difficile toxin which was negative on the last admission. She has been started on oral vancomycin and IV metronidazole but without any significant improvement 5 days. Dr. Marin Olp saw her this morning and recommended infliximab which has been described as a safe and effective treatment for ipilimumab-induced colitis. We are consulted for further advice regarding the management of the diarrhea.  Past Medical History  Diagnosis Date  . Thyroid disease   . Cancer ([place]) 08/25/14    metastatic melanoma; stage IV  . Emphysema of lung ([place])   . Glaucoma   . Stroke ([place])   . Meniere's disease   . Splenic infarct   . Hyperlipidemia 04/26/2015  . Hypertension   . PONV (postoperative nausea and vomiting)     Past Surgical History  Procedure Laterality Date  . Cholecystectomy  1992  . Colonoscopy N/A 09/20/2015    Procedure: COLONOSCOPY;  Surgeon: Ronald Lobo, MD;  Location: [facility];  Service: Endoscopy;  Laterality: N/A;    Medications Prior to Admission  Medication Sig Dispense Refill  . acetaminophen (TYLENOL) 500 MG tablet Take 1,000 mg by mouth every 6 (six) hours as needed for  moderate pain or headache.    . budesonide (ENTOCORT EC) 3 MG 24 hr capsule Take 3 capsules (9 mg total) by mouth daily. 30 capsule 4  . dronabinol (MARINOL) 5 MG capsule Take 1 capsule (5 mg total) by mouth 2 (two) times daily before a meal. 60 capsule 0  . famotidine (PEPCID) 20 MG tablet Take 20 mg by mouth daily.    . fluconazole (DIFLUCAN) 100 MG tablet Take 1 tablet (100 mg total) by mouth daily. 30 tablet 4  . Ipilimumab (YERVOY IV) Inject into the vein once.    . lidocaine-prilocaine (EMLA) cream Apply to affected area once 30 g 3  . loperamide (IMODIUM A-D) 2 MG tablet Take 4 mg by mouth as needed for diarrhea or loose stools.    Marland Kitchen LORazepam (ATIVAN) 0.5 MG tablet Take 1 tablet (0.5 mg total) by mouth every 6 (six) hours as needed (Nausea or vomiting). 90 tablet 0  . Meclizine HCl 25 MG CHEW Chew 1 tablet (25 mg total) by mouth daily as needed (dizziness). 90 each 0  . methimazole (TAPAZOLE) 5 MG tablet Take 5 mg by mouth daily.     Marland Kitchen morphine (MS CONTIN) 30 MG 12 hr tablet Take 1 tablet (30 mg total) by mouth every 12 (twelve) hours. 60 tablet 0  . morphine (MSIR) 15 MG tablet Take 1 tablet (15 mg total) by mouth every 6 (six) hours as needed for severe pain. 90 tablet 0  . predniSONE (DELTASONE) 10 MG tablet Take as directed in ipilimumab Curt Bears) instructions. (Patient taking differently: Take as directed in ipilimumab Curt Bears) instructions. Begin if  needed for Yervoy reaction.) 120 tablet 0    Allergies:  Allergies  Allergen Reactions  . Cotellic [Cobimetinib] Diarrhea  . Oxycodone Nausea And Vomiting  . Pseudoephedrine Other (See Comments)    Makes patient feel weird  . Scopolamine Other (See Comments)  . Tramadol     Other reaction(s): GI Upset (intolerance)    Family History  Problem Relation Age of Onset  . Thyroid disease Mother   . Thyroid disease Sister   . Cancer Sister   . Heart attack Brother     Social History:  reports that she has been smoking Cigarettes.   She has a 36 pack-year smoking history. She has never used smokeless tobacco. She reports that she does not drink alcohol or use illicit drugs.  Review of Systems: negative except as above   Blood pressure 98/52, pulse 103, temperature 97.7 F (36.5 C), temperature source Oral, resp. rate 16, height 5' 1" (1.549 m), weight 66.1 kg (145 lb 11.6 oz), last menstrual period 08/26/2007, SpO2 95 %. Head: Normocephalic, without obvious abnormality, atraumatic Neck: no adenopathy, no carotid bruit, no JVD, supple, symmetrical, trachea midline and thyroid not enlarged, symmetric, no tenderness/mass/nodules Resp: clear to auscultation bilaterally Cardio: regular rate and rhythm, S1, S2 normal, no murmur, click, rub or gallop GI: Abdomen soft moderately tender with normoactive bowel sounds. No hepatosplenomegaly mass or guarding. Extremities: extremities normal, atraumatic, no cyanosis or edema  Results for orders placed or performed during the hospital encounter of 10/02/15 (from the past 48 hour(s))  Glucose, capillary     Status: None   Collection Time: 10/06/15 11:47 AM  Result Value Ref Range   Glucose-Capillary 85 65 - 99 mg/dL  Glucose, capillary     Status: None   Collection Time: 10/06/15  5:49 PM  Result Value Ref Range   Glucose-Capillary 84 65 - 99 mg/dL   Comment 1 Notify RN    Comment 2 Document in Chart   Glucose, capillary     Status: None   Collection Time: 10/06/15 11:42 PM  Result Value Ref Range   Glucose-Capillary 87 65 - 99 mg/dL  Glucose, capillary     Status: Abnormal   Collection Time: 10/07/15  6:25 AM  Result Value Ref Range   Glucose-Capillary 114 (H) 65 - 99 mg/dL  Glucose, capillary     Status: Abnormal   Collection Time: 10/07/15 12:02 PM  Result Value Ref Range   Glucose-Capillary 110 (H) 65 - 99 mg/dL   Comment 1 Notify RN    Comment 2 Document in Chart   Magnesium     Status: None   Collection Time: 10/07/15  1:40 PM  Result Value Ref Range    Magnesium 1.7 1.7 - 2.4 mg/dL  Phosphorus     Status: Abnormal   Collection Time: 10/07/15  1:40 PM  Result Value Ref Range   Phosphorus 2.3 (L) 2.5 - 4.6 mg/dL  Basic metabolic panel     Status: Abnormal   Collection Time: 10/07/15  1:40 PM  Result Value Ref Range   Sodium 128 (L) 135 - 145 mmol/L   Potassium 4.3 3.5 - 5.1 mmol/L   Chloride 104 101 - 111 mmol/L   CO2 17 (L) 22 - 32 mmol/L   Glucose, Bld 157 (H) 65 - 99 mg/dL   BUN <5 (L) 6 - 20 mg/dL   Creatinine, Ser 0.82 0.44 - 1.00 mg/dL   Calcium 6.9 (L) 8.9 - 10.3 mg/dL   GFR calc non Af   Amer >60 >60 mL/min   GFR calc Af Amer >60 >60 mL/min    Comment: (NOTE) The eGFR has been calculated using the CKD EPI equation. This calculation has not been validated in all clinical situations. eGFR's persistently <60 mL/min signify possible Chronic Kidney Disease.    Anion gap 7 5 - 15  CBC     Status: Abnormal   Collection Time: 10/07/15  1:40 PM  Result Value Ref Range   WBC 6.3 4.0 - 10.5 K/uL   RBC 4.41 3.87 - 5.11 MIL/uL   Hemoglobin 11.2 (L) 12.0 - 15.0 g/dL   HCT 35.3 (L) 36.0 - 46.0 %   MCV 80.0 78.0 - 100.0 fL   MCH 25.4 (L) 26.0 - 34.0 pg   MCHC 31.7 30.0 - 36.0 g/dL   RDW 18.8 (H) 11.5 - 15.5 %   Platelets 210 150 - 400 K/uL  Glucose, capillary     Status: Abnormal   Collection Time: 10/07/15  6:08 PM  Result Value Ref Range   Glucose-Capillary 100 (H) 65 - 99 mg/dL  Glucose, capillary     Status: Abnormal   Collection Time: 10/07/15 11:47 PM  Result Value Ref Range   Glucose-Capillary 125 (H) 65 - 99 mg/dL  Glucose, capillary     Status: Abnormal   Collection Time: 10/08/15  6:14 AM  Result Value Ref Range   Glucose-Capillary 117 (H) 65 - 99 mg/dL   Comment 1 Notify RN    No results found.  Assessment: 1. Persistent diarrhea presumed primary etiology immunotherapy with ipilimumab, steroid refractory at this point and further complicated by C. difficile induced diarrhea. Plan:  1. I have no personal  experience with infliximab for ipilimumab-induced diarrhea but it is described as safe and effective in the literature. Obviously the concern is whether it is advisable to use it in the face of concomitant C. difficile infection. 2.  I agree with Dr. Marin Olp that we would appear to have little to lose by attempting it at this point, especially given that she has been treated with both vancomycin and metronidazole, although it is unclear to me whether she has been on antibiotics for 5 days, or only for 2 days since admission. In the event of the latter, I would probably wait 2 or 3 more days to give more time for treatment of C. difficile to demonstrate effectiveness. Will follow with you. Layci Stenglein C 10/08/2015, 8:30 AM  Pager [phone] If no answer or after 5 PM call [phone]

## 2015-10-08 NOTE — Progress Notes (Signed)
[facility] PROGRESS NOTE  Felicia Richmond [id number] DOB: 02/10/1961 DOA: 10/02/2015 PCP: Nance Pear., NP  Brief Summary  The patient is a 55 year old female with history of metastatic melanoma who is admitted 2 weeks prior to admission for diarrhea. At that time colonoscopy demonstrated lymphocytic colitis and she was placed on Entocort.  She presented with a three day history of worsening watery diarrhea and abdominal pain, particularly in the left upper and lower quadrant.  She denied recent antibiotics, fevers, chills.  She was hypokalemic.    Assessment/Plan  C. difficile diarrhea, risk factor was recent hospitalization, superimposed on chronic diarrhea that was recently worsened by a chemotherapeutic agent.   -  Continue vancomycin to 500mg  dose and add flagyl, treatment started on 2/8 -  Continue florastor -  Continue Rectal tube due to skin breakdown around bottom and to quantify diarrhea -  Increase IVF -  ID consult  Recent biopsy-proven lymphocytic colitis attributed to her immunotherapy for melanoma.  - continue Entocort - Adjustments made to melanoma tx per Dr. Marin Olp - Remicade and steroids reordered today >> This is an experimental treatment in a patient with active infection.  I have asked the RN to hold this for now until I can discuss with GI and infectious disease  Nausea and vomiting and abdominal pain, likely related to colitis and progressive malignancy, improving  - Continue zofran 8mg  IV q8h -  Continue prn phenergan -  Increase morphine IR frequency -  Continue MS contin  Hypoglycemia due to inability to tolerate PO - Continue rate of D10 - Hypoglycemia protocol prn - Agree with CBG q6h  Acute urinary retention likely secondary to rectal tube and pain medication -  Foley catheter placed 2/12  Possible RLL consolidation and large right pleural effusion, doubt pneumonia since stable on RA, afebrile, and leukocytosis resolved  -  Thoracentesis on 2/10:  530mL yellow fluid removed - Continue incentive spirometry  -  F/u cytology from thoracentesis  Metastatic melanoma, progression of disease on CT scan - Management per Oncology  Hyponatremia, iatrogenic and asymptomatic -  Monitor  Hypokalemia due to diarrhea, stable -  Magnesium 2 today - Continue potassium in IVF  Hypocalcemia (even with correction for albumin) - Calcium 4gm IV once -  Increase tums  Hyperthyroidism, stable, continue methimazole  Leukocytosis, due to infection and trending down with oral vancomycin - Trend WBC  Normocytic anemia, likely anemia of chronic disease due to malignancy and chemo, occult stool recently neg 09/19/2015 - Repeat hgb in AM  Diet:  Regular Access:  PIV IVF:  yes Proph:  lovenox  Code Status:  full Family Communication: patient alone Disposition Plan:  Pending improvement in diarrhea, tolerating adequate PO to stay hydrated, likely home in several more days   Consultants:  Oncology, Dr. Marin Olp  GI  ID  Procedures:  CT abd/pelvis  Antibiotics:  Oral vancomycin 2/8    HPI/Subjective:  Continues to have copious watery diarrhea.  Already put out 3.7L of green watery diarrhea yesterday.  No vomiting and nausea improved - wants to try to eat something more solid today.  Tolerating her pills and oral medications.    Objective: Filed Vitals:   10/07/15 0605 10/07/15 1320 10/07/15 2049 10/08/15 0526  BP: 105/59 110/63 124/60 98/52  Pulse: 102 89 114 103  Temp: 97.8 F (36.6 C) 97.8 F (36.6 C) 97.5 F (36.4 C) 97.7 F (36.5 C)  TempSrc: Oral Oral Oral Oral  Resp: 18 18 16 16   Height:  Weight:      SpO2: 98% 97% 98% 95%    Intake/Output Summary (Last 24 hours) at 10/08/15 0803 Last data filed at 10/08/15 0541  Gross per 24 hour  Intake 5387.57 ml  Output   4275 ml  Net 1112.57 ml   Filed Weights   10/02/15 2104 10/03/15 1420  Weight: 64.3 kg (141 lb 12.1 oz) 66.1 kg (145  lb 11.6 oz)   Body mass index is 27.55 kg/(m^2).  Exam:   General:  Adult female, No acute distress but ill-appearing  HEENT:  NCAT, MMM  Cardiovascular:  RRR, nl S1, S2 no mrg, 2+ pulses, warm extremities  Respiratory:  CTAB, no increased WOB  Abdomen:   Hyperactive BS, soft, TTP in the LUQ and LLQ and epigastrium to moderate palpation, no rebound or guarding  MSK:   Normal tone and bulk, no LEE  Data Reviewed: Basic Metabolic Panel:  Recent Labs Lab 10/02/15 1707 10/03/15 0630 10/04/15 0610 10/05/15 0530 10/06/15 0511 10/07/15 1340  NA 129* 130* 129* 131* 129* 128*  K 2.9* 3.4* 3.5 3.7 3.7 4.3  CL 94* 100* 100* 103 102 104  CO2 23 22 22 22  20* 17*  GLUCOSE 76 91 105* 78 76 157*  BUN 10 7 7  5* 6 <5*  CREATININE 0.61 0.59 0.64 0.46 0.65 0.82  CALCIUM 7.4* 6.6* 7.0* 7.3* 6.9* 6.9*  MG 1.7  --  2.0 1.9 2.0 1.7  PHOS  --   --  2.3* 2.9 2.6 2.3*   Liver Function Tests:  Recent Labs Lab 10/02/15 1707 10/03/15 0630 10/04/15 0610  AST 20 18 15   ALT 9* 7* 9*  ALKPHOS 60 56 64  BILITOT 0.8 0.6 0.5  PROT 5.0* 4.3* 4.3*  ALBUMIN 2.3* 2.0* 1.9*    Recent Labs Lab 10/02/15 1707  LIPASE 19   No results for input(s): AMMONIA in the last 168 hours. CBC:  Recent Labs Lab 10/03/15 0630 10/04/15 0610 10/05/15 0530 10/06/15 0511 10/07/15 1340  WBC 13.2* 8.1 7.2 6.3 6.3  NEUTROABS 9.7* 5.6  --   --   --   HGB 10.7* 10.7* 10.1* 10.9* 11.2*  HCT 34.2* 34.1* 32.2* 34.6* 35.3*  MCV 82.2 82.4 82.4 82.0 80.0  PLT 247 235 221 237 210    Recent Results (from the past 240 hour(s))  Gastrointestinal Panel by PCR , Stool     Status: None   Collection Time: 10/02/15  7:57 PM  Result Value Ref Range Status   Campylobacter species NOT DETECTED NOT DETECTED Final   Plesimonas shigelloides NOT DETECTED NOT DETECTED Final   Salmonella species NOT DETECTED NOT DETECTED Final   Yersinia enterocolitica NOT DETECTED NOT DETECTED Final   Vibrio species NOT DETECTED NOT  DETECTED Final   Vibrio cholerae NOT DETECTED NOT DETECTED Final   Enteroaggregative E coli (EAEC) NOT DETECTED NOT DETECTED Final   Enteropathogenic E coli (EPEC) NOT DETECTED NOT DETECTED Final   Enterotoxigenic E coli (ETEC) NOT DETECTED NOT DETECTED Final   Shiga like toxin producing E coli (STEC) NOT DETECTED NOT DETECTED Final   E. coli O157 NOT DETECTED NOT DETECTED Final   Shigella/Enteroinvasive E coli (EIEC) NOT DETECTED NOT DETECTED Final   Cryptosporidium NOT DETECTED NOT DETECTED Final   Cyclospora cayetanensis NOT DETECTED NOT DETECTED Final   Entamoeba histolytica NOT DETECTED NOT DETECTED Final   Giardia lamblia NOT DETECTED NOT DETECTED Final   Adenovirus F40/41 NOT DETECTED NOT DETECTED Final   Astrovirus NOT DETECTED NOT DETECTED  Final   Norovirus GI/GII NOT DETECTED NOT DETECTED Final   Rotavirus A NOT DETECTED NOT DETECTED Final   Sapovirus (I, II, IV, and V) NOT DETECTED NOT DETECTED Final  C difficile quick scan w PCR reflex     Status: Abnormal   Collection Time: 10/03/15 12:45 PM  Result Value Ref Range Status   C Diff antigen POSITIVE (A) NEGATIVE Final   C Diff toxin POSITIVE (A) NEGATIVE Final   C Diff interpretation Positive for toxigenic C. difficile  Final    Comment: CRITICAL RESULT CALLED TO, READ BACK BY AND VERIFIED WITH: [place] RN 2.8.17 @ 1535 BY RICEJ   MRSA PCR Screening     Status: None   Collection Time: 10/03/15  2:30 PM  Result Value Ref Range Status   MRSA by PCR NEGATIVE NEGATIVE Final    Comment:        The GeneXpert MRSA Assay (FDA approved for NASAL specimens only), is one component of a comprehensive MRSA colonization surveillance program. It is not intended to diagnose MRSA infection nor to guide or monitor treatment for MRSA infections.      Studies: No results found.  Scheduled Meds: . enoxaparin (LOVENOX) injection  40 mg Subcutaneous Q24H  . famotidine (PEPCID) IV  20 mg Intravenous BID  . feeding supplement  1  Container Oral TID BM  . fluconazole  100 mg Oral Daily  . inFLIXimab (REMICADE) infusion  5 mg/kg Intravenous Once  . methimazole  5 mg Oral Daily  . metronidazole  500 mg Intravenous Q8H  . morphine  30 mg Oral Q12H  . ondansetron (ZOFRAN) IV  8 mg Intravenous 3 times per day  . saccharomyces boulardii  250 mg Oral BID  . sodium chloride  1,000 mL Intravenous Once  . vancomycin  500 mg Oral 4 times per day   Continuous Infusions: . D-10-0.45% Sodium Chloride with KCL 40 meq/L 1000 ml 125 mL/hr at 10/07/15 [id number]    Active Problems:   Hyperthyroidism   Metastatic melanoma ([place])   Diarrhea   Hypokalemia   Abdominal pain   Malnutrition of moderate degree   C. difficile diarrhea    Time spent: 30 min    Kalynne Womac, [facility]  [facility] Pager [phone]. If 7PM-7AM, please contact night-coverage at [email or web address], password [facility] 10/08/2015, 8:03 AM  LOS: 6 days

## 2015-10-08 NOTE — Progress Notes (Signed)
She now has a rectal tube inserted. She's still having quite a lot of diarrhea. It is pretty much liquid. I realize that she has the C. difficile. However, it does not look like this is improving. As such, I have to believe that she has an element of the colitis from [facility]. I really think that we have to get her on Remicade. This has been very effective in diarrhea secondary to [facility].  I cannot explain why she has a urinary issues.  She still has the nausea and vomiting. I cannot explain this either. I wonder if GI needs to see her to do an upper endoscopy to make sure nothing is going on in her stomach and upper small bowel.  There are no labs back yet. Labs from yesterday did show the hyponatremia.  She really is not complaining too much in way of pain.  It is rare that you see this kind of diarrhea with [place]. However, it can happen. Having the C. difficile on top of it does not help at all.  It is interesting that when she was treated initially with oral therapy, she had bad diarrhea. As such, the oral therapy had to be adjusted.  On her exam, her vital signs look okay. Temperature 97.7. Pulse 103. Blood pressure 98/52. Abdomen is slightly distended. Bowel sounds are active. There is some slight tenderness to palpation. She has no obvious abdominal mass. There is no obvious hepatomegaly. Lungs are clear. Cardiac exam tachycardic but regular. Extremities shows no clubbing, cyanosis or edema. Neurological exam is nonfocal.  Again, I really think that part of the diarrhea etiology has to be from the [facility]. She still has profound diarrhea. It is all liquid. We really have to get her on some Remicade and see if this not stop the diarrhea. I really do not see much of a downside to trying the Remicade at this point. She has been on antibiotics now for about 5 days.  As always, I appreciate the outstanding care that she is getting from everybody on [address]. This is an incredibly complicated  case.  Lum Keas  Job [phone]

## 2015-10-09 DIAGNOSIS — I959 Hypotension, unspecified: Secondary | ICD-10-CM | POA: Diagnosis present

## 2015-10-09 LAB — BASIC METABOLIC PANEL
ANION GAP: 8 (ref 5–15)
BUN: <5 mg/dL — ABNORMAL LOW (ref 6–20)
CALCIUM: 6.9 mg/dL — AB (ref 8.9–10.3)
CHLORIDE: 109 mmol/L (ref 101–111)
CO2: 14 mmol/L — ABNORMAL LOW (ref 22–32)
CREATININE: 0.89 mg/dL (ref 0.44–1.00)
GFR calc non Af Amer: >60 mL/min (ref >60)
Glucose, Bld: 112 mg/dL — ABNORMAL HIGH (ref 65–99)
Potassium: 4.3 mmol/L (ref 3.5–5.1)
SODIUM: 131 mmol/L — AB (ref 135–145)

## 2015-10-09 LAB — GLUCOSE, CAPILLARY: GLUCOSE-CAPILLARY: 106 mg/dL — AB (ref 65–99)

## 2015-10-09 LAB — LACTIC ACID, PLASMA
LACTIC ACID, VENOUS: 4.2 mmol/L — AB (ref 0.5–2.0)
Lactic Acid, Venous: 4.2 mmol/L (ref 0.5–2.0)

## 2015-10-09 LAB — MAGNESIUM: Magnesium: 1.6 mg/dL — ABNORMAL LOW (ref 1.7–2.4)

## 2015-10-09 LAB — PHOSPHORUS: Phosphorus: 2.1 mg/dL — ABNORMAL LOW (ref 2.5–4.6)

## 2015-10-09 LAB — CORTISOL: CORTISOL PLASMA: 14.4 ug/dL

## 2015-10-09 MED ORDER — SODIUM CHLORIDE 0.9 % IV BOLUS (SEPSIS)
1000.0000 mL | Freq: Once | INTRAVENOUS | Status: AC
  Administered 2015-10-09: 1000 mL via INTRAVENOUS

## 2015-10-09 MED ORDER — SODIUM CHLORIDE 0.9 % IV SOLN
2.0000 g | Freq: Once | INTRAVENOUS | Status: AC
  Administered 2015-10-09: 2 g via INTRAVENOUS
  Filled 2015-10-09: qty 20

## 2015-10-09 MED ORDER — SODIUM CHLORIDE 0.9 % IV BOLUS (SEPSIS)
2000.0000 mL | Freq: Once | INTRAVENOUS | Status: AC
Start: 2015-10-09 — End: 2015-10-09
  Administered 2015-10-09: 2000 mL via INTRAVENOUS

## 2015-10-09 MED ORDER — MAGNESIUM SULFATE 4 GM/100ML IV SOLN
4.0000 g | Freq: Once | INTRAVENOUS | Status: AC
Start: 2015-10-09 — End: 2015-10-09
  Administered 2015-10-09: 4 g via INTRAVENOUS
  Filled 2015-10-09: qty 100

## 2015-10-09 NOTE — Progress Notes (Signed)
Nutrition Follow-up  DOCUMENTATION CODES:   Non-severe (moderate) malnutrition in context of acute illness/injury  INTERVENTION:   Continue Boost Breeze po TID, each supplement provides 250 kcal and 9 grams of protein Encourage PO intake RD to continue to monitor  NUTRITION DIAGNOSIS:   Inadequate oral intake related to acute illness, nausea, vomiting, inability to eat as evidenced by per patient/family report, NPO status.  Ongoing, now on regular diet  GOAL:   Patient will meet greater than or equal to 90% of their needs  Not meeting.  MONITOR:   PO intake, Supplement acceptance, Labs, Weight trends, I & O's  ASSESSMENT:   55 y.o. female with history of metastatic melanoma who was recently admitted 2 weeks ago for diarrhea and colonoscopy showed lymphocytic colitis and is on Entocort present to the ER because of recurrence of diarrhea. Patient states over the last 3 days patient is having multiple episodes of diarrhea denies any nausea vomiting but at this time patient is also having abdominal pain diffusely. Denies any antibiotics intake except for Diflucan for oral candidiasis. Denies any fever chills chest pain shortness of breath. Patient has been admitted for further management of dehydration diarrhea and abdominal pain. Patient is also found to be hypokalemic.   Patient in room, looks very uncomfortable. Pt reports feeling some nausea. She states she has not eaten anything today. Since admission her PO intake has ranged from 0-25%. She had a Boost Breeze at bedside but has not consumed any of it. Encouraged pt to work on sipping on supplements today. Encouraged her to order some soft foods to try like toast or applesauce. She says she just feels like her stomach is upset. She declines snacks at this time.  Labs reviewed: Low Na, BUN, Mg, Phos  Diet Order:  Diet regular Room service appropriate?: Yes; Fluid consistency:: Thin  Skin:  Reviewed, no issues  Last BM:   2/14  Height:   Ht Readings from Last 1 Encounters:  10/03/15 5\' 1"  (1.549 m)    Weight:   Wt Readings from Last 1 Encounters:  10/03/15 145 lb 11.6 oz (66.1 kg)    Ideal Body Weight:  47.73 kg (kg)  BMI:  Body mass index is 27.55 kg/(m^2).  Estimated Nutritional Needs:   Kcal:  1920-2115 (30-33 kcal/kg)  Protein:  75-90 grams (1.2-1.4 grams/kg)  Fluid:  >/= 2.1 L/day  EDUCATION NEEDS:   No education needs identified at this time  Clayton Bibles, MS, RD, LDN Pager: [phone] After Hours Pager: [phone]

## 2015-10-09 NOTE — Progress Notes (Signed)
ECG:  Sinus tachycardia

## 2015-10-09 NOTE — Progress Notes (Signed)
CRITICAL VALUE ALERT  Critical value received:  Lactic Acid 4.2  Date of notification:  10-09-15  Time of notification: 22:45  Critical value read back:Yes.    Nurse who received alert:  Zyrah Wiswell D. Sahithi Ordoyne  MD notified (1st page):  K. Schorr ([facility])  Time of first page:  23:45  MD notified (2nd page): N/A  Time of second page: N/A  Responding MD:    Time MD responded:

## 2015-10-09 NOTE — Progress Notes (Signed)
Report called to [facility] in SDU that will receive patient.

## 2015-10-09 NOTE — Progress Notes (Signed)
Pt Flexiseal came out. Rn reinserted tube. Pt tolerated fine. Tube in place.

## 2015-10-09 NOTE — Progress Notes (Addendum)
CRITICAL VALUE ALERT  Critical value received: lactic acid 4.2  Date of notification:  10/09/15  Time of notification:  2122  Critical value read back:Yes.    Nurse who received alert:  Duard Larsen, RN  RN notified (1st page):  Primary RN Leane Para notified.  Time of notification to primary RN: 2122  NP notified : Lamar Blinks, NP  Time of page: 2125

## 2015-10-09 NOTE — Progress Notes (Addendum)
[facility] PROGRESS NOTE  Felicia Richmond [id number] DOB: 1960/12/24 DOA: 10/02/2015 PCP: Nance Pear., NP  Brief Summary  The patient is a 55 year old female with history of metastatic melanoma who is admitted 2 weeks prior to admission for diarrhea. At that time colonoscopy demonstrated lymphocytic colitis and she was placed on Entocort.  She presented with a three day history of worsening watery diarrhea and abdominal pain, particularly in the left upper and lower quadrant.  She denied recent antibiotics, fevers, chills.  She was hypokalemic.    Assessment/Plan  C. difficile diarrhea, risk factor was recent hospitalization, superimposed on chronic diarrhea that was recently worsened by a chemotherapeutic agent.  She started on vancomycin PO 125mg  without flagyl on 2/8, but because she was not improving, her dose was increased to 500mg  and flagyl was added on 2/11. 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  - continue Entocort - Adjustments made to melanoma tx per Dr. Marin Olp - GI consult appreciated - May consider Remicade and steroids on Wednesday (after 5 days of higher dose therapy)  Nausea and vomiting and abdominal pain, likely related to colitis and progressive malignancy, improving  - Continue zofran 8mg  IV q8h -  Continue prn phenergan -  Continue morphine IR  -  Continue MS contin  Hypoglycemia due to inability to tolerate PO, CBG more stable last few days - Continue rate of D10 - Hypoglycemia protocol prn - increased to q12h  Acute urinary  retention likely secondary to rectal tube and pain medication -  Foley catheter placed 2/12  Possible RLL consolidation and large right pleural effusion, doubt pneumonia since stable on RA, afebrile, and leukocytosis resolved  - Thoracentesis on 2/10:  567mL yellow fluid removed - Continue incentive spirometry  -  F/u cytology from thoracentesis  Metastatic melanoma, progression of disease on CT scan - Management per Oncology  Hyponatremia, iatrogenic and asymptomatic -  Monitor  Hypokalemia due to diarrhea, stable -  Magnesium 4gm today - Continue potassium in IVF  Hypocalcemia (even with correction for albumin) - Calcium IV once  Hyperthyroidism, stable, continue methimazole  Leukocytosis, resolved with oral vancomycin - Trend WBC  Normocytic anemia, likely anemia of chronic disease due to malignancy and chemo, occult stool recently neg 09/19/2015  Diet:  Regular Access:  PIV IVF:  yes Proph:  lovenox  Code Status:  Full code > Dr. Marin Olp to please address.   Family Communication: patient alone Disposition Plan:  Prognosis is poor, transfer to stepdown due to worsening hypotension and tachycardia, progressive cancer, and possibly giving remicade to a patient with c. Diff colitis.    Consultants:  Oncology, Dr. Marin Olp  GI  ID  Procedures:  CT abd/pelvis  Antibiotics:  Oral vancomycin 2/8 >> dose increased on 2/11  Flagyl 2/11 >>  HPI/Subjective:  Continues to have copious watery diarrhea and becoming more hypotensive.     Objective: Filed Vitals:   10/08/15 1430 10/08/15 2123 10/09/15 0517 10/09/15 1427  BP: 93/57 105/55 94/56 85/58   Pulse: 102 115 131 128  Temp: 98.5 F (36.9 C) 98.1 F (36.7 C)  98.2 F (36.8 C) 98.2 F (36.8 C)  TempSrc: Oral Oral Oral Oral  Resp: 18 18 16 18   Height:      Weight:      SpO2: 96% 96% 96%     Intake/Output Summary (Last 24 hours) at 10/09/15 1648 Last data filed at 10/09/15 1453  Gross per 24  hour  Intake    502 ml  Output   3160 ml  Net  -2658 ml   Filed Weights   10/02/15 2104 10/03/15 1420  Weight: 64.3 kg (141 lb 12.1 oz) 66.1 kg (145 lb 11.6 oz)   Body mass index is 27.55 kg/(m^2).  Exam:   General:  Adult female, No acute distress but ill-appearing  HEENT:  NCAT, MMM  Cardiovascular:  Tachycardic RR, nl S1, S2 no mrg, 2+ pulses, warm extremities  Respiratory:  CTAB, no increased WOB  Abdomen:   Hyperactive BS, soft, TTP in the LUQ and LLQ and epigastrium to moderate palpation, no rebound or guarding  MSK:   Normal tone and bulk, no LEE  Data Reviewed: Basic Metabolic Panel:  Recent Labs Lab 10/05/15 0530 10/06/15 0511 10/07/15 1340 10/08/15 0846 10/09/15 1015  NA 131* 129* 128* 128* 131*  K 3.7 3.7 4.3 4.6 4.3  CL 103 102 104 104 109  CO2 22 20* 17* 18* 14*  GLUCOSE 78 76 157* 123* 112*  BUN 5* 6 <5* <5* <5*  CREATININE 0.46 0.65 0.82 0.80 0.89  CALCIUM 7.3* 6.9* 6.9* 7.1* 6.9*  MG 1.9 2.0 1.7 1.8 1.6*  PHOS 2.9 2.6 2.3* 2.1* 2.1*   Liver Function Tests:  Recent Labs Lab 10/02/15 1707 10/03/15 0630 10/04/15 0610  AST 20 18 15   ALT 9* 7* 9*  ALKPHOS 60 56 64  BILITOT 0.8 0.6 0.5  PROT 5.0* 4.3* 4.3*  ALBUMIN 2.3* 2.0* 1.9*    Recent Labs Lab 10/02/15 1707  LIPASE 19   No results for input(s): AMMONIA in the last 168 hours. CBC:  Recent Labs Lab 10/03/15 0630 10/04/15 0610 10/05/15 0530 10/06/15 0511 10/07/15 1340 10/08/15 0846  WBC 13.2* 8.1 7.2 6.3 6.3 5.7  NEUTROABS 9.7* 5.6  --   --   --   --   HGB 10.7* 10.7* 10.1* 10.9* 11.2* 11.5*  HCT 34.2* 34.1* 32.2* 34.6* 35.3* 36.9  MCV 82.2 82.4 82.4 82.0 80.0 81.6  PLT 247 235 221 237 210 210    Recent Results (from the past 240 hour(s))  Gastrointestinal Panel by PCR , Stool     Status: None   Collection Time: 10/02/15  7:57 PM  Result Value Ref Range Status   Campylobacter species NOT DETECTED NOT DETECTED Final   Plesimonas shigelloides NOT DETECTED NOT  DETECTED Final   Salmonella species NOT DETECTED NOT DETECTED Final   Yersinia enterocolitica NOT DETECTED NOT DETECTED Final   Vibrio species NOT DETECTED NOT DETECTED Final   Vibrio cholerae NOT DETECTED NOT DETECTED Final   Enteroaggregative E coli (EAEC) NOT DETECTED NOT DETECTED Final   Enteropathogenic E coli (EPEC) NOT DETECTED NOT DETECTED Final   Enterotoxigenic E coli (ETEC) NOT DETECTED NOT DETECTED Final   Shiga like toxin producing E coli (STEC) NOT DETECTED NOT DETECTED Final   E. coli O157 NOT DETECTED NOT DETECTED Final   Shigella/Enteroinvasive E coli (EIEC) NOT DETECTED NOT DETECTED Final   Cryptosporidium NOT DETECTED NOT DETECTED Final   Cyclospora cayetanensis NOT DETECTED NOT DETECTED Final   Entamoeba histolytica NOT DETECTED NOT DETECTED Final  Giardia lamblia NOT DETECTED NOT DETECTED Final   Adenovirus F40/41 NOT DETECTED NOT DETECTED Final   Astrovirus NOT DETECTED NOT DETECTED Final   Norovirus GI/GII NOT DETECTED NOT DETECTED Final   Rotavirus A NOT DETECTED NOT DETECTED Final   Sapovirus (I, II, IV, and V) NOT DETECTED NOT DETECTED Final  C difficile quick scan w PCR reflex     Status: Abnormal   Collection Time: 10/03/15 12:45 PM  Result Value Ref Range Status   C Diff antigen POSITIVE (A) NEGATIVE Final   C Diff toxin POSITIVE (A) NEGATIVE Final   C Diff interpretation Positive for toxigenic C. difficile  Final    Comment: CRITICAL RESULT CALLED TO, READ BACK BY AND VERIFIED WITH: KIM CHEEK RN 2.8.17 @ 1535 BY RICEJ   MRSA PCR Screening     Status: None   Collection Time: 10/03/15  2:30 PM  Result Value Ref Range Status   MRSA by PCR NEGATIVE NEGATIVE Final    Comment:        The GeneXpert MRSA Assay (FDA approved for NASAL specimens only), is one component of a comprehensive MRSA colonization surveillance program. It is not intended to diagnose MRSA infection nor to guide or monitor treatment for MRSA infections.      Studies: No  results found.  Scheduled Meds: . enoxaparin (LOVENOX) injection  40 mg Subcutaneous Q24H  . famotidine (PEPCID) IV  20 mg Intravenous BID  . feeding supplement  1 Container Oral TID BM  . fluconazole  100 mg Oral Daily  . LORazepam  1 mg Intravenous QHS  . methimazole  5 mg Oral Daily  . metronidazole  500 mg Intravenous Q8H  . morphine  30 mg Oral Q12H  . ondansetron (ZOFRAN) IV  8 mg Intravenous 3 times per day  . saccharomyces boulardii  250 mg Oral BID  . sodium chloride  2,000 mL Intravenous Once  . vancomycin  500 mg Oral 4 times per day   Continuous Infusions: . D-10-0.45% Sodium Chloride with KCL 40 meq/L 1000 ml 125 mL/hr at 10/09/15 0400    Active Problems:   Hyperthyroidism   Metastatic melanoma ([place])   Diarrhea   Hypokalemia   Abdominal pain   Malnutrition of moderate degree   C. difficile diarrhea    Time spent: 30 min    Dystany Duffy, [facility]  [facility] Pager [phone]. If 7PM-7AM, please contact night-coverage at [email or web address], password [facility] 10/09/2015, 4:48 PM  LOS: 7 days

## 2015-10-10 ENCOUNTER — Ambulatory Visit: Payer: BLUE CROSS/BLUE SHIELD | Admitting: Family

## 2015-10-10 ENCOUNTER — Inpatient Hospital Stay (HOSPITAL_BASED_OUTPATIENT_CLINIC_OR_DEPARTMENT_OTHER): Admission: RE | Admit: 2015-10-10 | Payer: BLUE CROSS/BLUE SHIELD | Source: Ambulatory Visit

## 2015-10-10 DIAGNOSIS — A419 Sepsis, unspecified organism: Secondary | ICD-10-CM | POA: Diagnosis present

## 2015-10-10 DIAGNOSIS — E872 Acidosis, unspecified: Secondary | ICD-10-CM | POA: Diagnosis present

## 2015-10-10 DIAGNOSIS — K52832 Lymphocytic colitis: Secondary | ICD-10-CM | POA: Diagnosis present

## 2015-10-10 DIAGNOSIS — R000 Tachycardia, unspecified: Secondary | ICD-10-CM | POA: Diagnosis present

## 2015-10-10 DIAGNOSIS — I959 Hypotension, unspecified: Secondary | ICD-10-CM

## 2015-10-10 LAB — CBC
HEMATOCRIT: 39 % (ref 36.0–46.0)
Hemoglobin: 12.3 g/dL (ref 12.0–15.0)
MCH: 25.8 pg — AB (ref 26.0–34.0)
MCHC: 31.5 g/dL (ref 30.0–36.0)
MCV: 81.9 fL (ref 78.0–100.0)
Platelets: 211 10*3/uL (ref 150–400)
RBC: 4.76 MIL/uL (ref 3.87–5.11)
RDW: 19.9 % — AB (ref 11.5–15.5)
WBC: 8.3 10*3/uL (ref 4.0–10.5)

## 2015-10-10 LAB — BASIC METABOLIC PANEL
Anion gap: 8 (ref 5–15)
CHLORIDE: 112 mmol/L — AB (ref 101–111)
CO2: 12 mmol/L — AB (ref 22–32)
CREATININE: 0.85 mg/dL (ref 0.44–1.00)
Calcium: 6.8 mg/dL — ABNORMAL LOW (ref 8.9–10.3)
GFR calc Af Amer: >60 mL/min (ref >60)
GFR calc non Af Amer: >60 mL/min (ref >60)
Glucose, Bld: 139 mg/dL — ABNORMAL HIGH (ref 65–99)
Potassium: 4.3 mmol/L (ref 3.5–5.1)
Sodium: 132 mmol/L — ABNORMAL LOW (ref 135–145)

## 2015-10-10 LAB — MAGNESIUM: Magnesium: 2.3 mg/dL (ref 1.7–2.4)

## 2015-10-10 LAB — PHOSPHORUS: Phosphorus: 2 mg/dL — ABNORMAL LOW (ref 2.5–4.6)

## 2015-10-10 LAB — LACTIC ACID, PLASMA
Lactic Acid, Venous: 4.9 mmol/L (ref 0.5–2.0)
Lactic Acid, Venous: 3.5 mmol/L (ref 0.5–2.0)

## 2015-10-10 MED ORDER — SODIUM CHLORIDE 0.9 % IV BOLUS (SEPSIS)
1000.0000 mL | Freq: Once | INTRAVENOUS | Status: AC
  Administered 2015-10-10: 1000 mL via INTRAVENOUS

## 2015-10-10 MED ORDER — SODIUM CHLORIDE 0.9 % IV SOLN
5.0000 mg/kg | Freq: Once | INTRAVENOUS | Status: AC
  Administered 2015-10-10: 300 mg via INTRAVENOUS
  Filled 2015-10-10: qty 30

## 2015-10-10 MED ORDER — POTASSIUM CHLORIDE IN NACL 40-0.9 MEQ/L-% IV SOLN
INTRAVENOUS | Status: DC
  Administered 2015-10-10 – 2015-10-11 (×3): 125 mL/h via INTRAVENOUS
  Filled 2015-10-10 (×5): qty 1000

## 2015-10-10 NOTE — Progress Notes (Signed)
Patient did not eat any meals. She drank half of a resource supplement. She is refusing to eat. She is extremely weak and fatigue through the day.

## 2015-10-10 NOTE — Progress Notes (Signed)
[facility] Progress Note  Subjective: Diarrhea persisted, unchanged according patient.  Objective: Vital signs in last 24 hours: Temp:  [97.5 F (36.4 C)-98.2 F (36.8 C)] 97.5 F (36.4 C) (02/15 0800) Pulse Rate:  [101-128] 114 (02/15 0930) Resp:  [15-27] 19 (02/15 0930) BP: (57-119)/(21-82) 108/67 mmHg (02/15 0930) SpO2:  [94 %-99 %] 99 % (02/15 0930) Weight:  [65.4 kg (144 lb 2.9 oz)] 65.4 kg (144 lb 2.9 oz) (02/14 2100) Weight change:    PE: Unchanged  Lab Results: Results for orders placed or performed during the hospital encounter of 10/02/15 (from the past 24 hour(s))  Cortisol     Status: None   Collection Time: 10/09/15  8:18 PM  Result Value Ref Range   Cortisol, Plasma 14.4 ug/dL  Lactic acid, plasma     Status: Abnormal   Collection Time: 10/09/15  8:18 PM  Result Value Ref Range   Lactic Acid, Venous 4.2 (HH) 0.5 - 2.0 mmol/L  Lactic acid, plasma     Status: Abnormal   Collection Time: 10/09/15 10:39 PM  Result Value Ref Range   Lactic Acid, Venous 4.2 (HH) 0.5 - 2.0 mmol/L  Lactic acid, plasma     Status: Abnormal   Collection Time: 10/10/15  3:03 AM  Result Value Ref Range   Lactic Acid, Venous 4.9 (HH) 0.5 - 2.0 mmol/L  Magnesium     Status: None   Collection Time: 10/10/15  3:28 AM  Result Value Ref Range   Magnesium 2.3 1.7 - 2.4 mg/dL  Phosphorus     Status: Abnormal   Collection Time: 10/10/15  3:28 AM  Result Value Ref Range   Phosphorus 2.0 (L) 2.5 - 4.6 mg/dL  Basic metabolic panel     Status: Abnormal   Collection Time: 10/10/15  3:28 AM  Result Value Ref Range   Sodium 132 (L) 135 - 145 mmol/L   Potassium 4.3 3.5 - 5.1 mmol/L   Chloride 112 (H) 101 - 111 mmol/L   CO2 12 (L) 22 - 32 mmol/L   Glucose, Bld 139 (H) 65 - 99 mg/dL   BUN <5 (L) 6 - 20 mg/dL   Creatinine, Ser 0.85 0.44 - 1.00 mg/dL   Calcium 6.8 (L) 8.9 - 10.3 mg/dL   GFR calc non Af Amer >60 >60 mL/min   GFR calc Af Amer >60 >60 mL/min   Anion gap 8 5 - 15   CBC     Status: Abnormal   Collection Time: 10/10/15  3:28 AM  Result Value Ref Range   WBC 8.3 4.0 - 10.5 K/uL   RBC 4.76 3.87 - 5.11 MIL/uL   Hemoglobin 12.3 12.0 - 15.0 g/dL   HCT 39.0 36.0 - 46.0 %   MCV 81.9 78.0 - 100.0 fL   MCH 25.8 (L) 26.0 - 34.0 pg   MCHC 31.5 30.0 - 36.0 g/dL   RDW 19.9 (H) 11.5 - 15.5 %   Platelets 211 150 - 400 K/uL  Lactic acid, plasma     Status: Abnormal   Collection Time: 10/10/15  6:27 AM  Result Value Ref Range   Lactic Acid, Venous 3.5 (HH) 0.5 - 2.0 mmol/L    Studies/Results: No results found.    Assessment: Persistent diarrhea, combination of ipilimumab, induced, lymphocytic colitis and possible C. difficile  Plan: Remicade begun per Dr. Marin Olp Continue vancomycin and Flagyl and florastar Hold budesonide for now. Consider sigmoidoscopy in the next few days if does not improve.   [id number] C 10/10/2015,  11:48 AM  Pager [phone] If no answer or after 5 PM call [phone]

## 2015-10-10 NOTE — Progress Notes (Signed)
Felicia Richmond is now back in the ICU. Her diarrhea continues. Her nurse said that she had over 2000 L of fluid from the rectal tube. She has been on antibiotics for the C. Difficile now for 6 days. I would really like to think that she should be getting better if the diarrhea is from C. Difficile.  GI saw her. They did not make any real recommendations for right now.  She still is not eating. She still is having nausea and vomiting. We are getting to the point where she does not have to be fed parenterally.   She still not gotten Remicade. I really do not see an issue with given her Remicade now. I still have to believe that the diarrhea is partially from the [place].  I would think that if the diarrhea was from C. Difficile, this would be much better. If we are worriedthat the C. Difficile is still active, I would think that a colonoscopy  Should be done and biopsies taken to see if there is active C. Difficile.  She did have a thoracentesis last Friday. 500 mL of fluid was removed.  I don't see where this was sent off for culture or for cytology.  Her labs look  About the same. She is not neutropenic.  Her potassium is 4.3.  On her physical exam,her temperature 98.2. Pulse is 109. Blood pressure 90/40. Head and neck exam shows no ocular or oral lesions. She has no mucositis. Lungs are clear. Cardiac exam tachycardic but regular.abdomen is somewhat distended. Bowel sounds are present. She has some slight tenderness to palpation. There is no palpable abdominal mass present palpable liver or spleen tip. Extremities shows no clubbing, cyanosis or edema.  Felicia Richmond continues have diarrhea that is unabated She's been on treatment for C. Difficile for now 6 days. It does not appear that this is gotten better.  I will try to give her the Remicade today. I just cannot imagine there will be a real issue with this.  I appreciate the one full care that she is getting from everybody. This is a very complicated  case.  Pete E.

## 2015-10-10 NOTE — Progress Notes (Signed)
[facility] Progress Note  Patient: Felicia Richmond [id number]   PCP: Nance Pear., NP DOB: Feb 14, 1961   DOA: 10/02/2015   DOS: 10/10/2015   Date of Service: the patient was seen and examined on 10/10/2015  Subjective: Patient of the symptom was fatigue and weakness, but continues to have increased output via rectal pouch, complains of nausea had one episode of vomiting yesterday. No chest pain shortness of breath cough or fever Nutrition: Tolerating oral diet Activity: Bedridden present Last BM: 10/10/2015  Assessment and Plan: 1. C. difficile diarrhea Lymphocytic colitis. Patient has persistent diarrhea with a biopsy-proven lymphocytic colitis, she was on Entocort. This was further worsened with combination of ipilimumab side effect as well as C. difficile infection. She was started on oral vancomycin despite which there was not improvement and therefore she was started on Flagyl. Oncology has started the patient on Remicade this morning. Continue probiotics. Holding Entocort as per GI. Replacing IV fluids, strict ins and outs. If no improvement may need to consider colonoscopy, ID consult. With lactic acidosis, hypotension, tachycardia the patient is meeting criteria for sepsis most likely secondary to C. difficile. Continue treatment in stepdown unit. Continue IV hydration. Zofran as needed, continue morphine and MS Contin at home doses.  2. Hypoglycemia. Patient was on D10 infusion. Currently we will attempt to switch her to normal saline with potassium.  3. Urinary retention. Currently has Foley catheter. Reevaluate in the morning, continuing at present due to her persistent diarrhea as well as requirement for strict ins and outs.  4. Metastatic melanoma. Management per oncology. Pleural fluid cytology negative, shows mild lymphocytes and reactive mesothelial cells.  5. Nausea vomiting with poor oral intake. Appreciate input from nutritional consult. Continue  supplements.  6. Hyperthyroidism Continue methimazole. Recheck TSH and free T4. Given her persistent diarrhea  7. Oral thrush. Completed one week of fluconazole  DVT Prophylaxis: subcutaneous Heparin Nutrition: Regular diet Advance goals of care discussion: Full code at present, will discuss with oncology regarding further input  Brief Summary of Hospitalization:  HPI: As per the H and P dictated on admission, "Felicia Richmond is a 55 y.o. female with history of metastatic melanoma who was recently admitted 2 weeks ago for diarrhea and colonoscopy showed lymphocytic colitis and is on Entocort present to the ER because of recurrence of diarrhea. Patient states over the last 3 days patient is having multiple episodes of diarrhea denies any nausea vomiting but at this time patient is also having abdominal pain diffusely. Denies any antibiotics intake except for Diflucan for oral candidiasis. Denies any fever chills chest pain shortness of breath. Patient has been admitted for further management of dehydration diarrhea and abdominal pain. Patient is also found to be hypokalemic.  " Daily update, Procedures: None Consultants: Gastroenterology, medical oncology Antibiotics: Anti-infectives    Start     Dose/Rate Route Frequency Ordered Stop   10/06/15 0800  vancomycin (VANCOCIN) 50 mg/mL oral solution 500 mg     500 mg Oral 4 times per day 10/06/15 0720 10/20/15 0559   10/06/15 0800  metroNIDAZOLE (FLAGYL) IVPB 500 mg     500 mg 100 mL/hr over 60 Minutes Intravenous Every 8 hours 10/06/15 0720 10/20/15 0759   10/03/15 1800  vancomycin (VANCOCIN) 50 mg/mL oral solution 125 mg  Status:  Discontinued     125 mg Oral 4 times daily 10/03/15 1558 10/06/15 0720   10/03/15 1000  fluconazole (DIFLUCAN) tablet 100 mg  Status:  Discontinued     100 mg Oral  Daily 10/02/15 2312 10/10/15 0736       Family Communication: No family was present at bedside, at the time of interview.  Disposition:   Barriers  to safe discharge: Diarrhea   Intake/Output Summary (Last 24 hours) at 10/10/15 1224 Last data filed at 10/10/15 0918  Gross per 24 hour  Intake   2860 ml  Output   4675 ml  Net  -1815 ml   Filed Weights   10/02/15 2104 10/03/15 1420 10/09/15 2100  Weight: 64.3 kg (141 lb 12.1 oz) 66.1 kg (145 lb 11.6 oz) 65.4 kg (144 lb 2.9 oz)    Objective: Physical Exam: Filed Vitals:   10/10/15 0700 10/10/15 0800 10/10/15 0930 10/10/15 1158  BP: 90/40  108/67 100/59  Pulse: 109  114 110  Temp:  97.5 F (36.4 C)    TempSrc:  Oral    Resp: 17  19 14   Height:      Weight:      SpO2: 99%  99% 99%     General: Appear in moderate distress, NO Rash; Oral Mucosa moist. Cardiovascular: S1 and S2 Present, no Murmur, no JVD Respiratory: Bilateral Air entry present and Clear to Auscultation, no Crackles, no wheezes Abdomen: Bowel Sound present, Soft and mild tenderness Extremities: no Pedal edema, no calf tenderness Neurology: Grossly no focal neuro deficit.  Data Reviewed: CBC:  Recent Labs Lab 10/04/15 0610 10/05/15 0530 10/06/15 0511 10/07/15 1340 10/08/15 0846 10/10/15 0328  WBC 8.1 7.2 6.3 6.3 5.7 8.3  NEUTROABS 5.6  --   --   --   --   --   HGB 10.7* 10.1* 10.9* 11.2* 11.5* 12.3  HCT 34.1* 32.2* 34.6* 35.3* 36.9 39.0  MCV 82.4 82.4 82.0 80.0 81.6 81.9  PLT 235 221 237 210 210 [id number]   Basic Metabolic Panel:  Recent Labs Lab 10/06/15 0511 10/07/15 1340 10/08/15 0846 10/09/15 1015 10/10/15 0328  NA 129* 128* 128* 131* 132*  K 3.7 4.3 4.6 4.3 4.3  CL 102 104 104 109 112*  CO2 20* 17* 18* 14* 12*  GLUCOSE 76 157* 123* 112* 139*  BUN 6 <5* <5* <5* <5*  CREATININE 0.65 0.82 0.80 0.89 0.85  CALCIUM 6.9* 6.9* 7.1* 6.9* 6.8*  MG 2.0 1.7 1.8 1.6* 2.3  PHOS 2.6 2.3* 2.1* 2.1* 2.0*   Liver Function Tests:  Recent Labs Lab 10/04/15 0610  AST 15  ALT 9*  ALKPHOS 64  BILITOT 0.5  PROT 4.3*  ALBUMIN 1.9*   No results for input(s): LIPASE, AMYLASE in the last 168  hours. No results for input(s): AMMONIA in the last 168 hours.  Cardiac Enzymes: No results for input(s): CKTOTAL, CKMB, CKMBINDEX, TROPONINI in the last 168 hours.  BNP (last 3 results) No results for input(s): BNP in the last 8760 hours.  CBG:  Recent Labs Lab 10/08/15 0614 10/08/15 1215 10/08/15 1902 10/08/15 2353 10/09/15 0627  GLUCAP 117* 88 97 89 106*    Recent Results (from the past 240 hour(s))  Gastrointestinal Panel by PCR , Stool     Status: None   Collection Time: 10/02/15  7:57 PM  Result Value Ref Range Status   Campylobacter species NOT DETECTED NOT DETECTED Final   Plesimonas shigelloides NOT DETECTED NOT DETECTED Final   Salmonella species NOT DETECTED NOT DETECTED Final   Yersinia enterocolitica NOT DETECTED NOT DETECTED Final   Vibrio species NOT DETECTED NOT DETECTED Final   Vibrio cholerae NOT DETECTED NOT DETECTED Final   Enteroaggregative E coli (  EAEC) NOT DETECTED NOT DETECTED Final   Enteropathogenic E coli (EPEC) NOT DETECTED NOT DETECTED Final   Enterotoxigenic E coli (ETEC) NOT DETECTED NOT DETECTED Final   Shiga like toxin producing E coli (STEC) NOT DETECTED NOT DETECTED Final   E. coli O157 NOT DETECTED NOT DETECTED Final   Shigella/Enteroinvasive E coli (EIEC) NOT DETECTED NOT DETECTED Final   Cryptosporidium NOT DETECTED NOT DETECTED Final   Cyclospora cayetanensis NOT DETECTED NOT DETECTED Final   Entamoeba histolytica NOT DETECTED NOT DETECTED Final   Giardia lamblia NOT DETECTED NOT DETECTED Final   Adenovirus F40/41 NOT DETECTED NOT DETECTED Final   Astrovirus NOT DETECTED NOT DETECTED Final   Norovirus GI/GII NOT DETECTED NOT DETECTED Final   Rotavirus A NOT DETECTED NOT DETECTED Final   Sapovirus (I, II, IV, and V) NOT DETECTED NOT DETECTED Final  C difficile quick scan w PCR reflex     Status: Abnormal   Collection Time: 10/03/15 12:45 PM  Result Value Ref Range Status   C Diff antigen POSITIVE (A) NEGATIVE Final   C Diff toxin  POSITIVE (A) NEGATIVE Final   C Diff interpretation Positive for toxigenic C. difficile  Final    Comment: CRITICAL RESULT CALLED TO, READ BACK BY AND VERIFIED WITH: KIM CHEEK RN 2.8.17 @ 1535 BY RICEJ   MRSA PCR Screening     Status: None   Collection Time: 10/03/15  2:30 PM  Result Value Ref Range Status   MRSA by PCR NEGATIVE NEGATIVE Final    Comment:        The GeneXpert MRSA Assay (FDA approved for NASAL specimens only), is one component of a comprehensive MRSA colonization surveillance program. It is not intended to diagnose MRSA infection nor to guide or monitor treatment for MRSA infections.      Studies: No results found.   Scheduled Meds: . enoxaparin (LOVENOX) injection  40 mg Subcutaneous Q24H  . famotidine (PEPCID) IV  20 mg Intravenous BID  . feeding supplement  1 Container Oral TID BM  . LORazepam  1 mg Intravenous QHS  . methimazole  5 mg Oral Daily  . metronidazole  500 mg Intravenous Q8H  . morphine  30 mg Oral Q12H  . ondansetron (ZOFRAN) IV  8 mg Intravenous 3 times per day  . saccharomyces boulardii  250 mg Oral BID  . vancomycin  500 mg Oral 4 times per day   Continuous Infusions: . 0.9 % NaCl with KCl 40 mEq / L     PRN Meds: acetaminophen **OR** acetaminophen, liver oil-zinc oxide, meclizine, morphine, promethazine, sodium chloride flush  Time spent: 30 minutes  Author: Berle Mull, MD [facility] Pager: [phone] 10/10/2015 12:24 PM  If 7PM-7AM, please contact night-coverage at [email or web address], password [facility]

## 2015-10-10 NOTE — Progress Notes (Signed)
CRITICAL VALUE ALERT  Critical value received:  Lactic Acid 4.9  Date of notification:  10-10-15  Time of notification:  03:50  Critical value read back:Yes.    Nurse who received alert:  Kamarrion Stfort D. Glendine Swetz   MD notified (1st page):  K. Scorr ([facility])  Time of first page:  03:50  MD notified (2nd page): N/A  Time of second page: N/A  Responding MD:    Time MD responded:

## 2015-10-10 NOTE — Progress Notes (Signed)
K. Schorr,NP notified of pt having a HR sustaining between the 130-140s while the pt is resting. BP 91/77. Order received for 1L bolus.

## 2015-10-11 ENCOUNTER — Ambulatory Visit: Payer: BLUE CROSS/BLUE SHIELD | Admitting: Family

## 2015-10-11 ENCOUNTER — Inpatient Hospital Stay (HOSPITAL_COMMUNITY): Payer: BLUE CROSS/BLUE SHIELD

## 2015-10-11 ENCOUNTER — Ambulatory Visit: Payer: BLUE CROSS/BLUE SHIELD

## 2015-10-11 ENCOUNTER — Other Ambulatory Visit: Payer: BLUE CROSS/BLUE SHIELD

## 2015-10-11 DIAGNOSIS — C439 Malignant melanoma of skin, unspecified: Secondary | ICD-10-CM

## 2015-10-11 DIAGNOSIS — A0472 Enterocolitis due to Clostridium difficile, not specified as recurrent: Secondary | ICD-10-CM | POA: Diagnosis present

## 2015-10-11 DIAGNOSIS — E872 Acidosis: Secondary | ICD-10-CM

## 2015-10-11 DIAGNOSIS — A4189 Other specified sepsis: Secondary | ICD-10-CM

## 2015-10-11 DIAGNOSIS — R229 Localized swelling, mass and lump, unspecified: Secondary | ICD-10-CM

## 2015-10-11 DIAGNOSIS — I9589 Other hypotension: Secondary | ICD-10-CM

## 2015-10-11 LAB — COMPREHENSIVE METABOLIC PANEL
ALK PHOS: 78 U/L (ref 38–126)
ALT: 7 U/L — ABNORMAL LOW (ref 14–54)
ANION GAP: 5 (ref 5–15)
AST: 15 U/L (ref 15–41)
Albumin: 1.6 g/dL — ABNORMAL LOW (ref 3.5–5.0)
BUN: <5 mg/dL — ABNORMAL LOW (ref 6–20)
CALCIUM: 6.8 mg/dL — AB (ref 8.9–10.3)
CO2: 11 mmol/L — AB (ref 22–32)
Chloride: 117 mmol/L — ABNORMAL HIGH (ref 101–111)
Creatinine, Ser: 1.05 mg/dL — ABNORMAL HIGH (ref 0.44–1.00)
GFR calc non Af Amer: 59 mL/min — ABNORMAL LOW (ref >60)
Glucose, Bld: 71 mg/dL (ref 65–99)
POTASSIUM: 5.5 mmol/L — AB (ref 3.5–5.1)
SODIUM: 133 mmol/L — AB (ref 135–145)
TOTAL PROTEIN: 3.4 g/dL — AB (ref 6.5–8.1)
Total Bilirubin: 0.5 mg/dL (ref 0.3–1.2)

## 2015-10-11 LAB — TSH: TSH: 15.32 u[IU]/mL — ABNORMAL HIGH (ref 0.350–4.500)

## 2015-10-11 LAB — BASIC METABOLIC PANEL
Anion gap: 4 — ABNORMAL LOW (ref 5–15)
Anion gap: 5 (ref 5–15)
CALCIUM: 4 mg/dL — AB (ref 8.9–10.3)
CO2: 15 mmol/L — ABNORMAL LOW (ref 22–32)
CO2: 10 mmol/L — ABNORMAL LOW (ref 22–32)
CREATININE: 0.85 mg/dL (ref 0.44–1.00)
CREATININE: 0.53 mg/dL (ref 0.44–1.00)
Calcium: 6.3 mg/dL — CL (ref 8.9–10.3)
Chloride: 118 mmol/L — ABNORMAL HIGH (ref 101–111)
Chloride: 127 mmol/L — ABNORMAL HIGH (ref 101–111)
GFR calc Af Amer: >60 mL/min (ref >60)
Glucose, Bld: 128 mg/dL — ABNORMAL HIGH (ref 65–99)
Glucose, Bld: 87 mg/dL (ref 65–99)
Potassium: 4.6 mmol/L (ref 3.5–5.1)
Potassium: 2.6 mmol/L — CL (ref 3.5–5.1)
SODIUM: 137 mmol/L (ref 135–145)
SODIUM: 142 mmol/L (ref 135–145)

## 2015-10-11 LAB — LACTIC ACID, PLASMA: LACTIC ACID, VENOUS: 4.4 mmol/L — AB (ref 0.5–2.0)

## 2015-10-11 LAB — CBC
HCT: 40.7 % (ref 36.0–46.0)
HEMOGLOBIN: 12.8 g/dL (ref 12.0–15.0)
MCH: 25.6 pg — AB (ref 26.0–34.0)
MCHC: 31.4 g/dL (ref 30.0–36.0)
MCV: 81.4 fL (ref 78.0–100.0)
Platelets: 268 10*3/uL (ref 150–400)
RBC: 5 MIL/uL (ref 3.87–5.11)
RDW: 20.8 % — AB (ref 11.5–15.5)
WBC: 11 10*3/uL — ABNORMAL HIGH (ref 4.0–10.5)

## 2015-10-11 LAB — MAGNESIUM: Magnesium: 1.8 mg/dL (ref 1.7–2.4)

## 2015-10-11 LAB — PHOSPHORUS: PHOSPHORUS: 2.5 mg/dL (ref 2.5–4.6)

## 2015-10-11 MED ORDER — SODIUM CHLORIDE 0.9 % IV SOLN
1.0000 g | Freq: Once | INTRAVENOUS | Status: AC
  Administered 2015-10-11: 1 g via INTRAVENOUS
  Filled 2015-10-11: qty 10

## 2015-10-11 MED ORDER — ALBUMIN HUMAN 5 % IV SOLN
25.0000 g | Freq: Once | INTRAVENOUS | Status: DC
  Filled 2015-10-11: qty 500

## 2015-10-11 MED ORDER — LACTATED RINGERS IV BOLUS (SEPSIS)
1000.0000 mL | Freq: Three times a day (TID) | INTRAVENOUS | Status: AC
  Administered 2015-10-11 – 2015-10-12 (×3): 1000 mL via INTRAVENOUS

## 2015-10-11 MED ORDER — METHYLPREDNISOLONE SODIUM SUCC 125 MG IJ SOLR
80.0000 mg | Freq: Two times a day (BID) | INTRAMUSCULAR | Status: DC
  Administered 2015-10-11 – 2015-10-12 (×3): 80 mg via INTRAVENOUS
  Filled 2015-10-11 (×4): qty 2

## 2015-10-11 MED ORDER — MORPHINE SULFATE ER 15 MG PO TBCR
15.0000 mg | EXTENDED_RELEASE_TABLET | Freq: Two times a day (BID) | ORAL | Status: DC
  Administered 2015-10-11 – 2015-10-22 (×22): 15 mg via ORAL
  Filled 2015-10-11 (×23): qty 1

## 2015-10-11 MED ORDER — SODIUM CHLORIDE 0.9 % IV BOLUS (SEPSIS): 1000.0000 mL | Freq: Three times a day (TID) | INTRAVENOUS | Status: DC

## 2015-10-11 MED ORDER — NYSTATIN 100000 UNIT/ML MT SUSP
5.0000 mL | Freq: Four times a day (QID) | OROMUCOSAL | Status: DC
  Administered 2015-10-11 – 2015-10-20 (×26): 500000 [IU] via ORAL
  Filled 2015-10-11 (×48): qty 5

## 2015-10-11 MED ORDER — LORAZEPAM 2 MG/ML IJ SOLN: 0.5000 mg | Freq: Every evening | INTRAMUSCULAR | Status: DC | PRN

## 2015-10-11 MED ORDER — SODIUM CHLORIDE 0.9 % IV BOLUS (SEPSIS)
1000.0000 mL | Freq: Once | INTRAVENOUS | Status: AC
  Administered 2015-10-11: 1000 mL via INTRAVENOUS

## 2015-10-11 MED ORDER — SODIUM BICARBONATE 8.4 % IV SOLN
INTRAVENOUS | Status: DC
  Administered 2015-10-11 – 2015-10-12 (×2): via INTRAVENOUS
  Filled 2015-10-11 (×5): qty 150

## 2015-10-11 MED ORDER — LORAZEPAM 2 MG/ML IJ SOLN: 0.5000 mg | Freq: Every day | INTRAMUSCULAR | Status: DC

## 2015-10-11 MED ORDER — LACTATED RINGERS IV BOLUS (SEPSIS)
1000.0000 mL | Freq: Once | INTRAVENOUS | Status: AC
  Administered 2015-10-11: 1000 mL via INTRAVENOUS

## 2015-10-11 NOTE — Progress Notes (Signed)
Notified K. Schorr,NP of pt's BP 78/36 and HR 130s. Pt resting in bed at this time. Order received for 1L bolus.

## 2015-10-11 NOTE — Progress Notes (Signed)
Felicia Richmond is somewhat lethagic and with a low BP.  She did get Remicade yesterday.  It is hard to tell what the diarrhea rate is right now.  I will start her on some steroids. I want to make sure that she is not adrenally insufficient. She had been on steroids previously.  I appreciate Dr. Amedeo Plenty' help. I definitely would agree with a colonoscopy or sigmoidoscopy if the diarrhea does not improve.  Her potassium is on the high side. This might be a sign of adrenal insufficiency. I know she had been getting potassium with her IV fluids.  Her lactic acid is coming down.  The cytology on the pleural effusion did not show any obvious melanoma.  She's not had any obvious temperature. Her heart rate is on the higher side. Her blood pressure is 81/40. Her lungs are clear bilaterally. Cardiac exam regular rate and rhythm with no murmurs, rubs or bruits. Abdomen is soft. There is some slight distention. There is slight guarding. Bowel sounds are present. Extremities shows no clubbing, cyanosis or edema.  She really is not eating. I think that we are going to have to get some IV nutrition to her. I do not see a "downside" for doing this.  I very much appreciate the outstanding care that she is getting. This is an incredibly difficult situation.  I still believe that the immunotherapy has helped the melanoma. The subcutaneous nodule on her chest wall does not appear as prominent.  It is hard to say how much longer she will be in the ICU. We have to work on her nutrition and blood pressure issues. Hopefully, the Remicade will help [place] and the diarrhea will begin to resolve.  Pete E.  1 Thessalonians 5:16-18

## 2015-10-11 NOTE — Consult Note (Signed)
PULMONARY / CRITICAL CARE MEDICINE   Name: Felicia Richmond MRN: [id number] DOB: 1961-03-10    ADMISSION DATE:  10/02/2015 CONSULTATION DATE:  10/11/15  REFERRING MD:  Dr. Posey Pronto / [facility]   CHIEF COMPLAINT:  Hypotension, C-Diff Colitis   HISTORY OF PRESENT ILLNESS:   55 y/o F with PMH of hyperthyroidism, emphysema, glaucoma, CVA, Meniere's disease, splenic infarct, HLD, HTN, metastatic melanoma (followed by Dr. Marin Olp, BRAF (+), progressive disease, on Pembrolizumab), and admission in 08/2015 for diarrhea with colonoscopy showing lymphocytic colitis (treated with Entocort with initial improvement) who presented to [facility] on 2/7 with reports of multiple episodes of diarrhea.     The patient was admitted per [facility] and assessed for infectious etiology of diarrhea which was positive for C-Diff.  All other GI pathogen studies were negative. CT of the abdomen assessed 2/8 and demonstrated progression of diffuse metastatic disease, enlargement of bilateral adrenal masses, R breast mass, RLL consolidation, small R effusion, and new third spacing of fluid into the abdominal wall as well as free fluid within the abdomen and pelvis.  The patient was initially treated with oral vancomycin but continued to have high volume diarrhea and was then started on IV Flagyl.  She was also started on Remicade per ONC for lymphocytic colitis.  Entocort was held per GI.   The patient continued to have high volume output (up to ~2-4L per day), persistent lactic acidosis and hypotension.  She was also found to have a large right pleural effusion and underwent thoracentesis on 2/10 with 500 ml yellow fluid removed.  Pathology from pleural fluid demonstrated reactive mesothelial cells and lymphocytes.     PCCM consulted for evaluation of hypotension.     PAST MEDICAL HISTORY :  She  has a past medical history of Thyroid disease; Cancer ([place]) (08/25/14); Emphysema of lung ([place]); Glaucoma; Stroke [facility]); Meniere's disease; Splenic infarct;  Hyperlipidemia (04/26/2015); Hypertension; and PONV (postoperative nausea and vomiting).  PAST SURGICAL HISTORY: She  has past surgical history that includes Cholecystectomy (1992) and Colonoscopy (N/A, 09/20/2015).  Allergies  Allergen Reactions  . Cotellic [Cobimetinib] Diarrhea  . Oxycodone Nausea And Vomiting  . Pseudoephedrine Other (See Comments)    Makes patient feel weird  . Scopolamine Other (See Comments)  . Tramadol     Other reaction(s): GI Upset (intolerance)    No current facility-administered medications on file prior to encounter.   Current Outpatient Prescriptions on File Prior to Encounter  Medication Sig  . acetaminophen (TYLENOL) 500 MG tablet Take 1,000 mg by mouth every 6 (six) hours as needed for moderate pain or headache.  . budesonide (ENTOCORT EC) 3 MG 24 hr capsule Take 3 capsules (9 mg total) by mouth daily.  Marland Kitchen dronabinol (MARINOL) 5 MG capsule Take 1 capsule (5 mg total) by mouth 2 (two) times daily before a meal.  . famotidine (PEPCID) 20 MG tablet Take 20 mg by mouth daily.  . fluconazole (DIFLUCAN) 100 MG tablet Take 1 tablet (100 mg total) by mouth daily.  Marland Kitchen lidocaine-prilocaine (EMLA) cream Apply to affected area once  . loperamide (IMODIUM A-D) 2 MG tablet Take 4 mg by mouth as needed for diarrhea or loose stools.  Marland Kitchen LORazepam (ATIVAN) 0.5 MG tablet Take 1 tablet (0.5 mg total) by mouth every 6 (six) hours as needed (Nausea or vomiting).  . Meclizine HCl 25 MG CHEW Chew 1 tablet (25 mg total) by mouth daily as needed (dizziness).  . methimazole (TAPAZOLE) 5 MG tablet Take 5 mg by mouth daily.   Marland Kitchen  morphine (MS CONTIN) 30 MG 12 hr tablet Take 1 tablet (30 mg total) by mouth every 12 (twelve) hours.  Marland Kitchen morphine (MSIR) 15 MG tablet Take 1 tablet (15 mg total) by mouth every 6 (six) hours as needed for severe pain.  . predniSONE (DELTASONE) 10 MG tablet Take as directed in ipilimumab Curt Bears) instructions. (Patient taking differently: Take as directed in  ipilimumab Curt Bears) instructions. Begin if needed for [place] reaction.)  . [DISCONTINUED] prochlorperazine (COMPAZINE) 10 MG tablet Take 1 tablet (10 mg total) by mouth every 6 (six) hours as needed (Nausea or vomiting).    FAMILY HISTORY:  Her has no family status information on file.   SOCIAL HISTORY: She  reports that she has been smoking Cigarettes.  She has a 36 pack-year smoking history. She has never used smokeless tobacco. She reports that she does not drink alcohol or use illicit drugs.  REVIEW OF SYSTEMS:   Unable to complete with patient due to altered mental status   SUBJECTIVE: RN reports intermittent hypotension, fluid responsive.   VITAL SIGNS: BP 94/34 mmHg  Pulse 118  Temp(Src) 97.9 F (36.6 C) (Oral)  Resp 21  Ht 5' 1" (1.549 m)  Wt 144 lb 2.9 oz (65.4 kg)  BMI 27.26 kg/m2  SpO2 99%  LMP 08/26/2007  HEMODYNAMICS:    VENTILATOR SETTINGS:    INTAKE / OUTPUT: I/O last 3 completed shifts: In: 0174 [P.O.:120; I.V.:3825; Other:120; IV Piggyback:1916] Out: 6100 [Urine:800; Stool:5300]  PHYSICAL EXAMINATION: General:  Chronically ill appearing female in NAD Neuro:  Mild lethargy, awakens to voice, oriented to self and place only, MAE, generalized weakness HEENT:  MM pink/dry, no jvd Cardiovascular:  s1s2 rrr, tachy, no m/r/g Lungs:  Even/non-labored, lungs bilaterally clear Abdomen:  Mild distention, tender to palpation  Musculoskeletal:  No acute deformities  Skin:  Warm/dry, no edema   LABS:  BMET  Recent Labs Lab 10/09/15 1015 10/10/15 0328 10/11/15 0434  NA 131* 132* 133*  K 4.3 4.3 5.5*  CL 109 112* 117*  CO2 14* 12* 11*  BUN <5* <5* <5*  CREATININE 0.89 0.85 1.05*  GLUCOSE 112* 139* 71    Electrolytes  Recent Labs Lab 10/09/15 1015 10/10/15 0328 10/11/15 0434  CALCIUM 6.9* 6.8* 6.8*  MG 1.6* 2.3 1.8  PHOS 2.1* 2.0* 2.5    CBC  Recent Labs Lab 10/08/15 0846 10/10/15 0328 10/11/15 0434  WBC 5.7 8.3 11.0*  HGB 11.5*  12.3 12.8  HCT 36.9 39.0 40.7  PLT 210 211 268    Coag's No results for input(s): APTT, INR in the last 168 hours.  Sepsis Markers  Recent Labs Lab 10/09/15 2239 10/10/15 0303 10/10/15 0627  LATICACIDVEN 4.2* 4.9* 3.5*    ABG No results for input(s): PHART, PCO2ART, PO2ART in the last 168 hours.  Liver Enzymes  Recent Labs Lab 10/11/15 0434  AST 15  ALT 7*  ALKPHOS 78  BILITOT 0.5  ALBUMIN 1.6*    Cardiac Enzymes No results for input(s): TROPONINI, PROBNP in the last 168 hours.  Glucose  Recent Labs Lab 10/07/15 2347 10/08/15 0614 10/08/15 1215 10/08/15 1902 10/08/15 2353 10/09/15 0627  GLUCAP 125* 117* 88 97 89 106*    Imaging No results found.   STUDIES:  2/08  CT ABD/Pelvis >> progression of diffuse metastatic disease, enlargement of bilateral adrenal masses, R breast mass, RLL consolidation, small R effusion, and new third spacing of fluid into the abdominal wall as well as free fluid within the abdomen and pelvis.  2/14  Cortisol >> 14.4   CULTURES: 2/08  Hep B >> negative 2/07  GI PCR >> negative  2/08  C-Diff PCR >> positive   ANTIBIOTICS: Oral Vanco 2/11 500 mg  >>   Flagyl 2/11 >>  Oral Vanco 180m qid 2/8 - 2/11  SIGNIFICANT EVENTS: 1/24 - 1/30  Admit for diarrhea, bx proven lymphocytic colitis secondary to immunotherapy, dehydration 2/07  Admit for diarrhea   LINES/TUBES: Foley 2/13>>> Rectal Tube 2/14>>> CVL Port R Chest 06/26/15>>>  DISCUSSION: 55y/o F with metastatic melanoma admitted for ongoing diarrhea.  Found to be C-Diff positive.  Ongoing hypotension with large volume stool output.  R pleural effusion s/p throa  ASSESSMENT / PLAN:  PULMONARY A: Right Pleural Effusion s/p Thoracentesis on 2/10 with 5072myellow fluid returned.  Lymphocytes noted on path, ? Translocation of peritoneal fluid?? P:   Intermittent CXR Monitor respiratory status closely  O2 to support saturations > 92%  CARDIOVASCULAR A:  Septic  Shock secondary to C-Diff Colitis  P:  Volume resuscitation, sodium bicarbonate gtt @ 12565mr See ID  ICU monitoring of hemodynamics  LR 1L bolus Q8 x4 doses to match stool output  Goal MAP > 65 May need vasopressors Solu-medrol 80 mg IV Q12 (?)   RENAL A:   Metabolic Acidosis - combination of NGMA + GMA with lactic acid and bicarbonate loss with diarrhea At Risk AKI - in setting of volume loss / diarrhea Hyponatremia  Hyperchloremia - likely hyperchloremic from NS administration  P:   Add D5w with Bicarb @ 125m26m Trend lactic acid  Monitor BMP / UOP  Replace electrolytes as indicated  May need K with Bicarb gtt Q8 BMP with NS boluses + Bicarbonate x24 hours Hopeful to be able to d/c boluses in am  GASTROINTESTINAL A:   C-Diff Colitis  Severe Protein Calorie Malnutrition  P:   See ID  Pepcid QD Discontinue florastor with active C-Diff and immunocompromised state   HEMATOLOGIC A:   Metastatic Melanoma  P:  Monitor CBC  Lovenox + SCD's for DVT prophylaxis   INFECTIOUS A:   C-Diff Colitis  Oral Thrush Immunocompromised Status P:   ABX / Cultures as above  Monitor stool output  Feeding supplement  May require small bore feeding tube placement, would use Vital for TF due to C-Diff Nystatin for thrush  ENDOCRINE A:   Hyperthyroidism  At Risk Hypoglycemia - with poor PO intake   P:   SSI with CBG Q4 D5 in IVF Assess free T4, TSH Continue Methimazole   NEUROLOGIC A:   Lethargy - in setting of metabolic disturbances, CT head 1/24 negative for acute process, 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 Please refer to her note which I reviewed in detail. 54 y25r old female with metastatic melanoma and profound metabolic and lactic acidosis from profuse watery diarrhea secondary to C. difficile colitis. Patient has sepsis secondary to C. difficile colitis and intermittent hypotension responsive to fluid boluses. Patient somewhat somnolent today during the time of  my exam. Overall had no complaints and seemed to be protecting her airway. She does have hyperchloremia from normal saline resuscitation. Patient's prognosis is guarded with high potential for further clinical decompensation.  1. Septic shock: Continuing to trend lactic acid was repeat in a.m. Intermittent lactated ringer IV fluid bolus. Attempting to match GI fluid loss. 2. Severe C. difficile colitis: Patient continuing on IV Flagyl & oral vancomycin Day #6. 3. Profound metabolic acidosis: Secondary to GI loss. Starting bicarbonate infusion at 150 mL per hour. Continuing to trend BMP every 8 hours. 4. Altered mental status: Multifactorial. Likely component of patient's narcotics as well as toxic metabolic encephalopathy. Decreasing doses of sedating medications. Monitoring closely in the intensive care unit for high potential of intubation. 5. Hyperchloremia: Likely secondary to fluid resuscitation. Switching to lactated Ringer's. Continuing to trend electrolytes every 8 hours.  I have spent a total of 37 minutes of critical care time today caring for the patient and reviewing the patient's electronic medical record.  Sonia Baller Ashok Cordia, M.D. [facility] Pager:  [phone] After 3pm or if no response, call [phone]

## 2015-10-11 NOTE — Progress Notes (Signed)
[facility] Progress Note  Patient: Felicia Richmond [id number]   PCP: Nance Pear., NP DOB: 10-02-1960   DOA: 10/02/2015   DOS: 10/11/2015   Date of Service: the patient was seen and examined on 10/11/2015  Subjective: Patient continues to complain of nausea without any vomiting. Urine output has been on the lower side. Complains of fatigue and appears more lethargic. Nutrition: Minimal oral intake Activity: Bedridden present Last BM: 10/11/2015  Assessment and Plan: 1. C. difficile diarrhea, sepsis Lymphocytic colitis. Patient has persistent diarrhea with a biopsy-proven lymphocytic colitis, she was on Entocort. This was further worsened with combination of ipilimumab side effect as well as C. difficile infection. She was started on oral vancomycin despite which there was not improvement and therefore she was started on Flagyl. Oncology has started the patient on Remicade, she received first dose 10/10/2015. Appreciate input from all the consultants. Oncology has started the patient on IV steroids. Critical care is also following up with the patient patient will begin receiving bicarbonate drip with dextrose and also will be receiving LR bolus every 8 hours. Given persistent hypotension I would also give her IV albumin. Primary goal is to match the urine output as well as stool output. Oncology has recommended to start the patient on parenteral nutrition although after discussion with GI as well as critical care it is felt appropriate to start the patient on enteral nutrition with tube feeding. Will start tomorrow. strict ins and outs. GI may consider flexible sigmoidoscopy for this patient in near future if there is no improvement.  With lactic acidosis, hypotension, tachycardia the patient is meeting criteria for sepsis most likely secondary to C. difficile. Continue treatment in stepdown unit. Zofran as needed, continue morphine and MS Contin at home doses.  2.  Hypoglycemia. Blood glucose stable. Patient is again on dextrose with bicarbonate infusion.  3. Urinary retention. Currently has Foley catheter. continuing at present due to her persistent diarrhea as well as requirement for strict ins and outs.  4. Metastatic melanoma. Management per oncology. Pleural fluid cytology negative, shows mild lymphocytes and reactive mesothelial cells.  5. Nausea vomiting with poor oral intake. Appreciate input from nutritional consult. Continue supplements. Tube feeding starting tomorrow  6. Hyperthyroidism Continue methimazole. Pending TSH and free T4. Given her persistent diarrhea  7. Oral thrush. Completed one week of fluconazole  DVT Prophylaxis: subcutaneous Heparin Nutrition: Regular diet Advance goals of care discussion: Full code at present, will discuss with oncology regarding further input  Brief Summary of Hospitalization:  HPI: As per the H and P dictated on admission, "Felicia Richmond is a 55 y.o. female with history of metastatic melanoma who was recently admitted 2 weeks ago for diarrhea and colonoscopy showed lymphocytic colitis and is on Entocort present to the ER because of recurrence of diarrhea. Patient states over the last 3 days patient is having multiple episodes of diarrhea denies any nausea vomiting but at this time patient is also having abdominal pain diffusely. Denies any antibiotics intake except for Diflucan for oral candidiasis. Denies any fever chills chest pain shortness of breath. Patient has been admitted for further management of dehydration diarrhea and abdominal pain. Patient is also found to be hypokalemic. " Daily update, Procedures: None Consultants: Gastroenterology, medical oncology, critical care Antibiotics: Anti-infectives    Start     Dose/Rate Route Frequency Ordered Stop   10/06/15 0800  vancomycin (VANCOCIN) 50 mg/mL oral solution 500 mg     500 mg Oral 4 times per day 10/06/15 0720  10/20/15 0559   10/06/15  0800  metroNIDAZOLE (FLAGYL) IVPB 500 mg     500 mg 100 mL/hr over 60 Minutes Intravenous Every 8 hours 10/06/15 0720 10/20/15 0759   10/03/15 1800  vancomycin (VANCOCIN) 50 mg/mL oral solution 125 mg  Status:  Discontinued     125 mg Oral 4 times daily 10/03/15 1558 10/06/15 0720   10/03/15 1000  fluconazole (DIFLUCAN) tablet 100 mg  Status:  Discontinued     100 mg Oral Daily 10/02/15 2312 10/10/15 0736      Family Communication: Discussed with patient's daughter on phone, explained to her about the findings of the CT scan with progression of the cancer as well as current treatment plan. She will be here tomorrow to visit the patient. She is concerned that the patient is not improving despite aggressive therapy and told me that she would not like her mother to suffer.  Disposition:  Barriers to safe discharge: Diarrhea   Intake/Output Summary (Last 24 hours) at 10/11/15 1726 Last data filed at 10/11/15 0604  Gross per 24 hour  Intake   2629 ml  Output   1900 ml  Net    729 ml   Filed Weights   10/02/15 2104 10/03/15 1420 10/09/15 2100  Weight: 64.3 kg (141 lb 12.1 oz) 66.1 kg (145 lb 11.6 oz) 65.4 kg (144 lb 2.9 oz)   Objective: Physical Exam: Filed Vitals:   10/11/15 1545 10/11/15 1600 10/11/15 1630 10/11/15 1700  BP: 88/49 92/42 89/48  77/31  Pulse: 100 104 103 99  Temp:      TempSrc:      Resp: 20 14 16 11   Height:      Weight:      SpO2: 97% 98% 97% 96%    General: Appear in moderate distress, NO Rash; Oral Mucosa moist. Cardiovascular: S1 and S2 Present, no Murmur, no JVD Respiratory: Bilateral Air entry present and Clear to Auscultation, no Crackles, no wheezes Abdomen: Bowel Sound present, Soft and mild tenderness Extremities: no Pedal edema, no calf tenderness  Data Reviewed: CBC:  Recent Labs Lab 10/06/15 0511 10/07/15 1340 10/08/15 0846 10/10/15 0328 10/11/15 0434  WBC 6.3 6.3 5.7 8.3 11.0*  HGB 10.9* 11.2* 11.5* 12.3 12.8  HCT 34.6* 35.3* 36.9 39.0  40.7  MCV 82.0 80.0 81.6 81.9 81.4  PLT 237 210 210 211 [id number]   Basic Metabolic Panel:  Recent Labs Lab 10/07/15 1340 10/08/15 0846 10/09/15 1015 10/10/15 0328 10/11/15 0434  NA 128* 128* 131* 132* 133*  K 4.3 4.6 4.3 4.3 5.5*  CL 104 104 109 112* 117*  CO2 17* 18* 14* 12* 11*  GLUCOSE 157* 123* 112* 139* 71  BUN <5* <5* <5* <5* <5*  CREATININE 0.82 0.80 0.89 0.85 1.05*  CALCIUM 6.9* 7.1* 6.9* 6.8* 6.8*  MG 1.7 1.8 1.6* 2.3 1.8  PHOS 2.3* 2.1* 2.1* 2.0* 2.5   Liver Function Tests:  Recent Labs Lab 10/11/15 0434  AST 15  ALT 7*  ALKPHOS 78  BILITOT 0.5  PROT 3.4*  ALBUMIN 1.6*   No results for input(s): LIPASE, AMYLASE in the last 168 hours. No results for input(s): AMMONIA in the last 168 hours.  Cardiac Enzymes: No results for input(s): CKTOTAL, CKMB, CKMBINDEX, TROPONINI in the last 168 hours.  BNP (last 3 results) No results for input(s): BNP in the last 8760 hours.  CBG:  Recent Labs Lab 10/08/15 0614 10/08/15 1215 10/08/15 1902 10/08/15 2353 10/09/15 0627  GLUCAP 117* 88 97 89 106*  Recent Results (from the past 240 hour(s))  Gastrointestinal Panel by PCR , Stool     Status: None   Collection Time: 10/02/15  7:57 PM  Result Value Ref Range Status   Campylobacter species NOT DETECTED NOT DETECTED Final   Plesimonas shigelloides NOT DETECTED NOT DETECTED Final   Salmonella species NOT DETECTED NOT DETECTED Final   Yersinia enterocolitica NOT DETECTED NOT DETECTED Final   Vibrio species NOT DETECTED NOT DETECTED Final   Vibrio cholerae NOT DETECTED NOT DETECTED Final   Enteroaggregative E coli (EAEC) NOT DETECTED NOT DETECTED Final   Enteropathogenic E coli (EPEC) NOT DETECTED NOT DETECTED Final   Enterotoxigenic E coli (ETEC) NOT DETECTED NOT DETECTED Final   Shiga like toxin producing E coli (STEC) NOT DETECTED NOT DETECTED Final   E. coli O157 NOT DETECTED NOT DETECTED Final   Shigella/Enteroinvasive E coli (EIEC) NOT DETECTED NOT DETECTED  Final   Cryptosporidium NOT DETECTED NOT DETECTED Final   Cyclospora cayetanensis NOT DETECTED NOT DETECTED Final   Entamoeba histolytica NOT DETECTED NOT DETECTED Final   Giardia lamblia NOT DETECTED NOT DETECTED Final   Adenovirus F40/41 NOT DETECTED NOT DETECTED Final   Astrovirus NOT DETECTED NOT DETECTED Final   Norovirus GI/GII NOT DETECTED NOT DETECTED Final   Rotavirus A NOT DETECTED NOT DETECTED Final   Sapovirus (I, II, IV, and V) NOT DETECTED NOT DETECTED Final  C difficile quick scan w PCR reflex     Status: Abnormal   Collection Time: 10/03/15 12:45 PM  Result Value Ref Range Status   C Diff antigen POSITIVE (A) NEGATIVE Final   C Diff toxin POSITIVE (A) NEGATIVE Final   C Diff interpretation Positive for toxigenic C. difficile  Final    Comment: CRITICAL RESULT CALLED TO, READ BACK BY AND VERIFIED WITH: KIM CHEEK RN 2.8.17 @ 1535 BY RICEJ   MRSA PCR Screening     Status: None   Collection Time: 10/03/15  2:30 PM  Result Value Ref Range Status   MRSA by PCR NEGATIVE NEGATIVE Final    Comment:        The GeneXpert MRSA Assay (FDA approved for NASAL specimens only), is one component of a comprehensive MRSA colonization surveillance program. It is not intended to diagnose MRSA infection nor to guide or monitor treatment for MRSA infections.      Studies: Dg Chest Port 1 View  10/11/2015  CLINICAL DATA:  Sob,  Hx of emphysema EXAM: PORTABLE CHEST 1 VIEW COMPARISON:  10/05/2015 FINDINGS: Right-sided power port tip to level of the superior vena cava. Heart size is normal. Anterior mediastinal adenopathy again noted. There are small bilateral pleural effusions, right greater than left. There has been some improvement in lung aeration. IMPRESSION: 1. Bilateral effusions. 2. Slightly improved aeration. 3. Stable appearance of adenopathy. Electronically Signed   By: Nolon Nations M.D.   On: 10/11/2015 08:47   Dg Abd Portable 1v  10/11/2015  CLINICAL DATA:  Abdominal  pain, hypertension, emphysema, metastatic melanoma EXAM: PORTABLE ABDOMEN - 1 VIEW COMPARISON:  CT abdomen and pelvis 10/03/2015 FINDINGS: Nonobstructive bowel gas pattern. Upper normal caliber transverse colon with mild wall thickening again identified. Scattered air-filled nondistended loops of bowel throughout remainder of abdomen. No definite evidence of bowel obstruction. Osseous structures unremarkable. No urinary tract calcification. Surgical clips RIGHT upper quadrant from cholecystectomy. IMPRESSION: Upper normal caliber transverse colon with mild wall thickening raising question of colitis. No evidence of bowel obstruction. Electronically Signed   By: Elta Guadeloupe  Thornton Papas M.D.   On: 10/11/2015 08:46     Scheduled Meds: . albumin human  25 g Intravenous Once  . enoxaparin (LOVENOX) injection  40 mg Subcutaneous Q24H  . famotidine (PEPCID) IV  20 mg Intravenous BID  . feeding supplement  1 Container Oral TID BM  . lactated ringers  1,000 mL Intravenous 3 times per day  . LORazepam  0.5 mg Intravenous QHS  . methimazole  5 mg Oral Daily  . methylPREDNISolone (SOLU-MEDROL) injection  80 mg Intravenous Q12H  . metronidazole  500 mg Intravenous Q8H  . morphine  15 mg Oral Q12H  . nystatin  5 mL Oral QID  . ondansetron (ZOFRAN) IV  8 mg Intravenous 3 times per day  . sodium chloride  1,000 mL Intravenous Once  . vancomycin  500 mg Oral 4 times per day   Continuous Infusions: .  sodium bicarbonate  infusion 1000 mL 125 mL/hr at 10/11/15 0931   PRN Meds: acetaminophen **OR** acetaminophen, liver oil-zinc oxide, meclizine, morphine, sodium chloride flush  Time spent: 30 minutes  Author: Berle Mull, MD [facility] Pager: [phone] 10/11/2015 5:26 PM  If 7PM-7AM, please contact night-coverage at [email or web address], password [facility]

## 2015-10-11 NOTE — Progress Notes (Signed)
Date: October 11, 2015 Chart reviewed for concurrent status and case management needs. Will continue to follow patient for changes and needs: cdiff with neuro changes, elevated wbc, tempand hypotension/ Velva Harman, BSN, [place], [place]   [phone]

## 2015-10-12 ENCOUNTER — Inpatient Hospital Stay (HOSPITAL_COMMUNITY): Payer: BLUE CROSS/BLUE SHIELD

## 2015-10-12 DIAGNOSIS — G9340 Encephalopathy, unspecified: Secondary | ICD-10-CM

## 2015-10-12 DIAGNOSIS — E038 Other specified hypothyroidism: Secondary | ICD-10-CM

## 2015-10-12 DIAGNOSIS — E2740 Unspecified adrenocortical insufficiency: Secondary | ICD-10-CM

## 2015-10-12 LAB — BASIC METABOLIC PANEL
ANION GAP: 7 (ref 5–15)
Anion gap: 10 (ref 5–15)
BUN: <5 mg/dL — ABNORMAL LOW (ref 6–20)
BUN: 5 mg/dL — AB (ref 6–20)
CALCIUM: 6.5 mg/dL — AB (ref 8.9–10.3)
CALCIUM: 6.5 mg/dL — AB (ref 8.9–10.3)
CHLORIDE: 113 mmol/L — AB (ref 101–111)
CO2: 16 mmol/L — AB (ref 22–32)
CO2: 16 mmol/L — AB (ref 22–32)
CREATININE: 0.95 mg/dL (ref 0.44–1.00)
Chloride: 113 mmol/L — ABNORMAL HIGH (ref 101–111)
Creatinine, Ser: 0.92 mg/dL (ref 0.44–1.00)
GFR calc Af Amer: >60 mL/min (ref >60)
GFR calc non Af Amer: >60 mL/min (ref >60)
GLUCOSE: 125 mg/dL — AB (ref 65–99)
GLUCOSE: 138 mg/dL — AB (ref 65–99)
POTASSIUM: 4.2 mmol/L (ref 3.5–5.1)
Potassium: 4 mmol/L (ref 3.5–5.1)
Sodium: 136 mmol/L (ref 135–145)
Sodium: 139 mmol/L (ref 135–145)

## 2015-10-12 LAB — COMPREHENSIVE METABOLIC PANEL
ALBUMIN: 1.5 g/dL — AB (ref 3.5–5.0)
ALK PHOS: 147 U/L — AB (ref 38–126)
ALT: 11 U/L — ABNORMAL LOW (ref 14–54)
ANION GAP: 5 (ref 5–15)
AST: 15 U/L (ref 15–41)
BILIRUBIN TOTAL: 0.7 mg/dL (ref 0.3–1.2)
CO2: 16 mmol/L — AB (ref 22–32)
Calcium: 6.4 mg/dL — CL (ref 8.9–10.3)
Chloride: 115 mmol/L — ABNORMAL HIGH (ref 101–111)
Creatinine, Ser: 0.92 mg/dL (ref 0.44–1.00)
GFR calc Af Amer: >60 mL/min (ref >60)
GFR calc non Af Amer: >60 mL/min (ref >60)
GLUCOSE: 130 mg/dL — AB (ref 65–99)
POTASSIUM: 4.3 mmol/L (ref 3.5–5.1)
SODIUM: 136 mmol/L (ref 135–145)
Total Protein: <3 g/dL — ABNORMAL LOW (ref 6.5–8.1)

## 2015-10-12 LAB — LACTIC ACID, PLASMA: Lactic Acid, Venous: 3.5 mmol/L (ref 0.5–2.0)

## 2015-10-12 LAB — T4, FREE: Free T4: <0.25 ng/dL — ABNORMAL LOW (ref 0.61–1.12)

## 2015-10-12 LAB — CBC
HEMATOCRIT: 30.1 % — AB (ref 36.0–46.0)
Hemoglobin: 9.6 g/dL — ABNORMAL LOW (ref 12.0–15.0)
MCH: 25.7 pg — ABNORMAL LOW (ref 26.0–34.0)
MCHC: 31.9 g/dL (ref 30.0–36.0)
MCV: 80.7 fL (ref 78.0–100.0)
Platelets: 224 10*3/uL (ref 150–400)
RBC: 3.73 MIL/uL — AB (ref 3.87–5.11)
RDW: 21.1 % — AB (ref 11.5–15.5)
WBC: 15.2 10*3/uL — AB (ref 4.0–10.5)

## 2015-10-12 LAB — MAGNESIUM: Magnesium: 1.6 mg/dL — ABNORMAL LOW (ref 1.7–2.4)

## 2015-10-12 LAB — PHOSPHORUS: PHOSPHORUS: 2.5 mg/dL (ref 2.5–4.6)

## 2015-10-12 MED ORDER — METHYLPREDNISOLONE SODIUM SUCC 40 MG IJ SOLR
40.0000 mg | Freq: Every day | INTRAMUSCULAR | Status: DC
Start: 2015-10-13 — End: 2015-10-15
  Administered 2015-10-13 – 2015-10-14 (×2): 40 mg via INTRAVENOUS
  Filled 2015-10-12 (×3): qty 1

## 2015-10-12 MED ORDER — ALTEPLASE 2 MG IJ SOLR
2.0000 mg | Freq: Once | INTRAMUSCULAR | Status: AC
  Administered 2015-10-12: 2 mg
  Filled 2015-10-12: qty 2

## 2015-10-12 MED ORDER — LEVOTHYROXINE SODIUM 100 MCG IV SOLR
25.0000 ug | Freq: Every day | INTRAVENOUS | Status: DC
  Administered 2015-10-12 – 2015-10-13 (×2): 25 ug via INTRAVENOUS
  Filled 2015-10-12 (×2): qty 5

## 2015-10-12 MED ORDER — HEPARIN SODIUM (PORCINE) 5000 UNIT/ML IJ SOLN
5000.0000 [IU] | Freq: Three times a day (TID) | INTRAMUSCULAR | Status: DC
  Administered 2015-10-12 – 2015-10-22 (×31): 5000 [IU] via SUBCUTANEOUS
  Filled 2015-10-12 (×34): qty 1

## 2015-10-12 MED ORDER — STERILE WATER FOR INJECTION IJ SOLN
INTRAMUSCULAR | Status: AC
  Filled 2015-10-12: qty 10

## 2015-10-12 NOTE — Progress Notes (Addendum)
PULMONARY / CRITICAL CARE MEDICINE   Name: Felicia Richmond MRN: [id number] DOB: September 04, 1960    ADMISSION DATE:  10/02/2015 CONSULTATION DATE:  10/11/15  REFERRING MD:  Dr. Posey Richmond / [facility]   CHIEF COMPLAINT:  Hypotension, C-Diff Colitis   HISTORY OF PRESENT ILLNESS:   55 y/o F with PMH of hyperthyroidism, emphysema, glaucoma, CVA, Meniere's disease, splenic infarct, HLD, HTN, metastatic melanoma (followed by Dr. Marin Richmond, BRAF (+), progressive disease, on Pembrolizumab), and admission in 08/2015 for diarrhea with colonoscopy showing lymphocytic colitis (treated with Entocort with initial improvement) who presented to [facility] on 2/7 with reports of multiple episodes of diarrhea.     The patient was admitted per [facility] and assessed for infectious etiology of diarrhea which was positive for C-Diff.  All other GI pathogen studies were negative. CT of the abdomen assessed 2/8 and demonstrated progression of diffuse metastatic disease, enlargement of bilateral adrenal masses, R breast mass, RLL consolidation, small R effusion, and new third spacing of fluid into the abdominal wall as well as free fluid within the abdomen and pelvis.  The patient was initially treated with oral vancomycin but continued to have high volume diarrhea and was then started on IV Flagyl.  She was also started on Remicade per ONC for lymphocytic colitis.  Entocort was held per GI.   The patient continued to have high volume output (up to ~2-4L per day), persistent lactic acidosis and hypotension.  She was also found to have a large right pleural effusion and underwent thoracentesis on 2/10 with 500 ml yellow fluid removed.  Pathology from pleural fluid demonstrated reactive mesothelial cells and lymphocytes.     PCCM consulted for evaluation of hypotension.    SUBJECTIVE: Patient continues to have abdominal pain. Diarrhea has significantly slowed down. Denies any nausea. No fever, chills, or sweats. UOP remains marginal.  REVIEW OF SYSTEMS:  No  chest pain or pressure. No dyspnea or cough.   VITAL SIGNS: BP 95/53 mmHg  Pulse 103  Temp(Src) 97.3 F (36.3 C) (Oral)  Resp 14  Ht 5' 1"  (1.549 m)  Wt 175 lb 11.3 oz (79.7 kg)  BMI 33.22 kg/m2  SpO2 98%  LMP 08/26/2007  HEMODYNAMICS:    VENTILATOR SETTINGS:    INTAKE / OUTPUT: I/O last 3 completed shifts: In: 5422.4 [P.O.:100; I.V.:3810.4; IV Piggyback:1512] Out: 2000 [Urine:300; Stool:1700]  PHYSICAL EXAMINATION: General:  Elderly female. No distress. Awake. Neuro:  Oriented x4. Follows commands. Grossly nonfocal. HEENT:  Moist mucus membranes. No scleral icterus. NGT in place. Cardiovascular:  Regular rate. No JVD. S1 & S2 normal. Lungs:  Clear to auscultation. Normal work of breathing. Abdomen:  Soft. Protuberant. Mildly tender to palpation. Skin:  Warm & dry. No rash on exposed skin.  LABS:  BMET  Recent Labs Lab 10/11/15 2300 10/12/15 0034 10/12/15 0500  NA 142 136 136  K 2.6* 4.3 4.2  CL 127* 115* 113*  CO2 10* 16* 16*  BUN <5* <5* <5*  CREATININE 0.53 0.92 0.92  GLUCOSE 87 130* 125*    Electrolytes  Recent Labs Lab 10/10/15 0328 10/11/15 0434  10/11/15 2300 10/12/15 0034 10/12/15 0500  CALCIUM 6.8* 6.8*  < > 4.0* 6.4* 6.5*  MG 2.3 1.8  --   --   --  1.6*  PHOS 2.0* 2.5  --   --   --  2.5  < > = values in this interval not displayed.  CBC  Recent Labs Lab 10/10/15 0328 10/11/15 0434 10/12/15 0500  WBC 8.3 11.0* 15.2*  HGB 12.3 12.8 9.6*  HCT 39.0 40.7 30.1*  PLT 211 268 224    Coag's No results for input(s): APTT, INR in the last 168 hours.  Sepsis Markers  Recent Labs Lab 10/10/15 0627 10/11/15 0840 10/12/15 0500  LATICACIDVEN 3.5* 4.4* 3.5*    ABG No results for input(s): PHART, PCO2ART, PO2ART in the last 168 hours.  Liver Enzymes  Recent Labs Lab 10/11/15 0434 10/12/15 0034  AST 15 15  ALT 7* 11*  ALKPHOS 78 147*  BILITOT 0.5 0.7  ALBUMIN 1.6* 1.5*    Cardiac Enzymes No results for input(s):  TROPONINI, PROBNP in the last 168 hours.  Glucose  Recent Labs Lab 10/07/15 2347 10/08/15 0614 10/08/15 1215 10/08/15 1902 10/08/15 2353 10/09/15 0627  GLUCAP 125* 117* 88 97 89 106*    Imaging Dg Abd Portable 1v  10/12/2015  CLINICAL DATA:  Nasogastric tube placement EXAM: PORTABLE ABDOMEN - 1 VIEW COMPARISON:  October 11, 2015 FINDINGS: Nasogastric tube tip and side port are in the stomach. The visualized bowel gas pattern is unremarkable. Visualized lung bases are clear. IMPRESSION: Nasogastric tube tip and side port in stomach. Visualized bowel gas pattern unremarkable. Electronically Signed   By: Lowella Grip III M.D.   On: 10/12/2015 07:58     STUDIES:  2/08  CT ABD/Pelvis >> progression of diffuse metastatic disease, enlargement of bilateral adrenal masses, R breast mass, RLL consolidation, small R effusion, and new third spacing of fluid into the abdominal wall as well as free fluid within the abdomen and pelvis.  2/14  Cortisol >> 14.4   CULTURES: 2/08  Hep B >> negative 2/07  GI PCR >> negative  2/08  C-Diff PCR >> positive  ANTIBIOTICS: Oral Vanco 2/11 500 mg  >>   Flagyl 2/11 >>  Oral Vanco 158m qid 2/8 - 2/11  SIGNIFICANT EVENTS: 1/24 - 1/30  Admit for diarrhea, bx proven lymphocytic colitis secondary to immunotherapy, dehydration 2/07 - Admit for diarrhea   LINES/TUBES: Foley 2/13>>> Rectal Tube 2/14>>> CVL Port R Chest 06/26/15>>>  ASSESSMENT / PLAN:  INFECTIOUS A:   C-Diff Colitis  Oral Thrush Immunocompromised Status P:   ABX / Cultures as above  Monitor stool output  Feeding supplement  May require small bore feeding tube placement, would use Vital for TF due to C-Diff Nystatin for thrush  PULMONARY A: Right Pleural Effusion s/p Thoracentesis on 2/10 with 5036myellow fluid returned.  Lymphocytes noted on path, ? Translocation of peritoneal fluid??  P:   Intermittent CXR Monitor respiratory status closely  O2 to support  saturations > 92%  CARDIOVASCULAR A:  Septic Shock secondary to C-Diff Colitis - Shock resolved.  P:  Monitor on telemetry ICU monitoring of hemodynamics  Monitor on telemetry  RENAL A:   Metabolic Acidosis - Improving. Combination of NGMA + GMA with lactic acid and bicarbonate loss with diarrhea Acute Renal Failure - Secondary to volume loss.  Hyponatremia - Resolved. Hyperchloremia - Improving. Lactic Acidosis - Improving.  P:   Continue Bicarb drip at 125cc/hr Repeat Electrolytes at 1700 today Trending UOP with foley Daily BUN/Creatinine to monitor renal function Repeat Lactic Acid in AM  GASTROINTESTINAL A:   Severe C-Diff Colitis  Severe Protein Calorie Malnutrition   P:   See ID  Pepcid QD Full Liquid diet  HEMATOLOGIC A:   Metastatic Melanoma   P:  Monitor CBC  Heparin Emily q8hr SCDs  ENDOCRINE A:   Hyperthyroidism  At Risk Hypoglycemia - with poor PO  intake    P:   SSI with CBG Q4 D5 in IVF Continue Methimazole   NEUROLOGIC A:   Acute Encephalopathy - Resolving. Multifactorial from hypotension & toxic metabolic encephalopathy.  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 difficile colitis. Diarrhea is steadily resolving. Continuing patient on Flagyl & Vancomycin. Continuing bicarb drip for now with repeat electrolytes this afternoon. Worsening renal failure likely due to patient's volume loss and hypotension. Mental status has steadily improving. Transfer back to hospitalist service on 2/18 & PCCM Felicia sign off.  Sonia Baller Ashok Cordia, M.D. [facility] Pager:  [phone] After 3pm or if no  response, call [phone]  10/12/2015, 11:16 AM

## 2015-10-12 NOTE — Progress Notes (Signed)
[facility] Progress Note  Subjective: States she is doing better diarrhea slowing down feels better overall. Head feeding tube down but it came out. Think she can tolerate by mouth's  Objective: Vital signs in last 24 hours: Temp:  [97.3 F (36.3 C)-97.7 F (36.5 C)] 97.3 F (36.3 C) (02/17 0800) Pulse Rate:  [96-111] 96 (02/17 1100) Resp:  [9-24] 14 (02/17 1100) BP: (70-119)/(28-79) 103/48 mmHg (02/17 1100) SpO2:  [95 %-98 %] 98 % (02/17 1100) Weight:  [79.7 kg (175 lb 11.3 oz)] 79.7 kg (175 lb 11.3 oz) (02/17 0423) Weight change:    PE: Looks better, brighter  Lab Results: Results for orders placed or performed during the hospital encounter of 10/02/15 (from the past 24 hour(s))  Basic metabolic panel     Status: Abnormal   Collection Time: 10/11/15 11:00 PM  Result Value Ref Range   Sodium 142 135 - 145 mmol/L   Potassium 2.6 (LL) 3.5 - 5.1 mmol/L   Chloride 127 (H) 101 - 111 mmol/L   CO2 10 (L) 22 - 32 mmol/L   Glucose, Bld 87 65 - 99 mg/dL   BUN <5 (L) 6 - 20 mg/dL   Creatinine, Ser 0.53 0.44 - 1.00 mg/dL   Calcium 4.0 (LL) 8.9 - 10.3 mg/dL   GFR calc non Af Amer >60 >60 mL/min   GFR calc Af Amer >60 >60 mL/min   Anion gap 5 5 - 15  T4, free     Status: Abnormal   Collection Time: 10/11/15 11:30 PM  Result Value Ref Range   Free T4 <0.25 (L) 0.61 - 1.12 ng/dL  Comprehensive metabolic panel     Status: Abnormal   Collection Time: 10/12/15 12:34 AM  Result Value Ref Range   Sodium 136 135 - 145 mmol/L   Potassium 4.3 3.5 - 5.1 mmol/L   Chloride 115 (H) 101 - 111 mmol/L   CO2 16 (L) 22 - 32 mmol/L   Glucose, Bld 130 (H) 65 - 99 mg/dL   BUN <5 (L) 6 - 20 mg/dL   Creatinine, Ser 0.92 0.44 - 1.00 mg/dL   Calcium 6.4 (LL) 8.9 - 10.3 mg/dL   Total Protein <3.0 (L) 6.5 - 8.1 g/dL   Albumin 1.5 (L) 3.5 - 5.0 g/dL   AST 15 15 - 41 U/L   ALT 11 (L) 14 - 54 U/L   Alkaline Phosphatase 147 (H) 38 - 126 U/L   Total Bilirubin 0.7 0.3 - 1.2 mg/dL   GFR calc  non Af Amer >60 >60 mL/min   GFR calc Af Amer >60 >60 mL/min   Anion gap 5 5 - 15  Magnesium     Status: Abnormal   Collection Time: 10/12/15  5:00 AM  Result Value Ref Range   Magnesium 1.6 (L) 1.7 - 2.4 mg/dL  Phosphorus     Status: None   Collection Time: 10/12/15  5:00 AM  Result Value Ref Range   Phosphorus 2.5 2.5 - 4.6 mg/dL  CBC     Status: Abnormal   Collection Time: 10/12/15  5:00 AM  Result Value Ref Range   WBC 15.2 (H) 4.0 - 10.5 K/uL   RBC 3.73 (L) 3.87 - 5.11 MIL/uL   Hemoglobin 9.6 (L) 12.0 - 15.0 g/dL   HCT 30.1 (L) 36.0 - 46.0 %   MCV 80.7 78.0 - 100.0 fL   MCH 25.7 (L) 26.0 - 34.0 pg   MCHC 31.9 30.0 - 36.0 g/dL   RDW 21.1 (H)  11.5 - 15.5 %   Platelets 224 150 - 400 K/uL  Basic metabolic panel     Status: Abnormal   Collection Time: 10/12/15  5:00 AM  Result Value Ref Range   Sodium 136 135 - 145 mmol/L   Potassium 4.2 3.5 - 5.1 mmol/L   Chloride 113 (H) 101 - 111 mmol/L   CO2 16 (L) 22 - 32 mmol/L   Glucose, Bld 125 (H) 65 - 99 mg/dL   BUN <5 (L) 6 - 20 mg/dL   Creatinine, Ser 0.92 0.44 - 1.00 mg/dL   Calcium 6.5 (L) 8.9 - 10.3 mg/dL   GFR calc non Af Amer >60 >60 mL/min   GFR calc Af Amer >60 >60 mL/min   Anion gap 7 5 - 15  Lactic acid, plasma     Status: Abnormal   Collection Time: 10/12/15  5:00 AM  Result Value Ref Range   Lactic Acid, Venous 3.5 (HH) 0.5 - 2.0 mmol/L    Studies/Results: Dg Chest Port 1 View  10/11/2015  CLINICAL DATA:  Sob,  Hx of emphysema EXAM: PORTABLE CHEST 1 VIEW COMPARISON:  10/05/2015 FINDINGS: Right-sided power port tip to level of the superior vena cava. Heart size is normal. Anterior mediastinal adenopathy again noted. There are small bilateral pleural effusions, right greater than left. There has been some improvement in lung aeration. IMPRESSION: 1. Bilateral effusions. 2. Slightly improved aeration. 3. Stable appearance of adenopathy. Electronically Signed   By: Nolon Nations M.D.   On: 10/11/2015 08:47   Dg Abd  Portable 1v  10/12/2015  CLINICAL DATA:  Nasogastric tube placement EXAM: PORTABLE ABDOMEN - 1 VIEW COMPARISON:  October 11, 2015 FINDINGS: Nasogastric tube tip and side port are in the stomach. The visualized bowel gas pattern is unremarkable. Visualized lung bases are clear. IMPRESSION: Nasogastric tube tip and side port in stomach. Visualized bowel gas pattern unremarkable. Electronically Signed   By: Lowella Grip III M.D.   On: 10/12/2015 07:58   Dg Abd Portable 1v  10/11/2015  CLINICAL DATA:  Abdominal pain, hypertension, emphysema, metastatic melanoma EXAM: PORTABLE ABDOMEN - 1 VIEW COMPARISON:  CT abdomen and pelvis 10/03/2015 FINDINGS: Nonobstructive bowel gas pattern. Upper normal caliber transverse colon with mild wall thickening again identified. Scattered air-filled nondistended loops of bowel throughout remainder of abdomen. No definite evidence of bowel obstruction. Osseous structures unremarkable. No urinary tract calcification. Surgical clips RIGHT upper quadrant from cholecystectomy. IMPRESSION: Upper normal caliber transverse colon with mild wall thickening raising question of colitis. No evidence of bowel obstruction. Electronically Signed   By: Lavonia Dana M.D.   On: 10/11/2015 08:46      Assessment: Multifactorial diarrhea, immunomodulators induced, C. difficile, and lymphocytic colitis all likely playing a role, apparently somewhat improved with addition of Remicade  Plan: 1. Continue antibiotics for C. difficile for at least 10-14 days and florastar or at least 2 weeks beyond 2. At some point would resume budesonide. 3. Full liquid diet and nutritional supplements and advance as tolerated, hopefully avoiding necessity of tube feedings. We'll continue to follow.    Nilesh Stegall C 10/12/2015, 1:09 PM  Pager [phone] If no answer or after 5 PM call [phone]

## 2015-10-12 NOTE — Progress Notes (Signed)
Felicia Richmond looks a lot better this AM!!  She is much more alert. Her blood pressure is better. Her diarrhea has slowed down.  It is possible that one issues could've been adrenal insufficiency. 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 It is possible that because of this, she just has not been able to produce any type of cortisone. I suspect she probably will need some type of chronic cortisone replacement.  Her TSH is also quite high. I realize that she has a history of hyperthyroidism. However, one side effects of Yervoy is hypo-thyroidism. As such, I will actually start her on some Synthroid. We'll start on low-dose.  Again, the diarrhea is slowing down. I have to believe that the Remicade has helped.  She is a little bit hungry. Hopefully, she will be oh to have something to eat today. Hopefully some clear liquids or maybe full liquids will help.  She's had no nausea this morning.  She really wants to have the rectal tube taken out. Hopefully this can be done.  Her labs are looking pretty good. Her white cell count is 15.2. Hemoglobin 9.6. Platelet count 224,000. Her potassium is 4.2. Her creatinine is 0.92.  Her blood pressure is 108/74. Temperature 97.5. Pulse is 99. Her lungs sound clear. There may be some slight decrease at the bases. Cardiac exam regular rate and rhythm. Abdomen is soft. There is not as much distention. She has no fluid wave. There is no guarding. Extremities shows no clubbing, cyanosis or edema.  For now, I will like to hope that she is "turning the corner". I think that this steroids and the symptoms were hopefully will help her.  We still have a long way to go.  I talked her about her situation. She understands that her cancer is likely worsening. The 1 "caveat" is that with immunotherapy, and often looks as if the cancer is progressing initially and then he started see regression. However, given the recent CT scan, one would have to think that she probably is  progressing.  On her skin, her chest wall nodule does appear to be a bit better.  I think gorilla he is going to be nutrition for her. Hopefully she will be able to have something to eat.  As always, the staff in the ICU are doing a fantastic job.  [name] 46:1

## 2015-10-13 LAB — CBC
HEMATOCRIT: 29.3 % — AB (ref 36.0–46.0)
HEMOGLOBIN: 9.5 g/dL — AB (ref 12.0–15.0)
MCH: 25.9 pg — ABNORMAL LOW (ref 26.0–34.0)
MCHC: 32.4 g/dL (ref 30.0–36.0)
MCV: 79.8 fL (ref 78.0–100.0)
Platelets: 227 10*3/uL (ref 150–400)
RBC: 3.67 MIL/uL — AB (ref 3.87–5.11)
RDW: 21.2 % — ABNORMAL HIGH (ref 11.5–15.5)
WBC: 17.3 10*3/uL — AB (ref 4.0–10.5)

## 2015-10-13 LAB — RENAL FUNCTION PANEL
ALBUMIN: 1.5 g/dL — AB (ref 3.5–5.0)
ANION GAP: 9 (ref 5–15)
BUN: 5 mg/dL — ABNORMAL LOW (ref 6–20)
CHLORIDE: 108 mmol/L (ref 101–111)
CO2: 19 mmol/L — AB (ref 22–32)
Calcium: 6.4 mg/dL — CL (ref 8.9–10.3)
Creatinine, Ser: 0.98 mg/dL (ref 0.44–1.00)
Glucose, Bld: 125 mg/dL — ABNORMAL HIGH (ref 65–99)
POTASSIUM: 3.7 mmol/L (ref 3.5–5.1)
Phosphorus: 2.3 mg/dL — ABNORMAL LOW (ref 2.5–4.6)
Sodium: 136 mmol/L (ref 135–145)

## 2015-10-13 LAB — BASIC METABOLIC PANEL
ANION GAP: 8 (ref 5–15)
BUN: 6 mg/dL (ref 6–20)
CHLORIDE: 107 mmol/L (ref 101–111)
CO2: 21 mmol/L — AB (ref 22–32)
CREATININE: 0.95 mg/dL (ref 0.44–1.00)
Calcium: 6.6 mg/dL — ABNORMAL LOW (ref 8.9–10.3)
GFR calc non Af Amer: >60 mL/min (ref >60)
Glucose, Bld: 115 mg/dL — ABNORMAL HIGH (ref 65–99)
POTASSIUM: 3.8 mmol/L (ref 3.5–5.1)
SODIUM: 136 mmol/L (ref 135–145)

## 2015-10-13 LAB — MAGNESIUM
MAGNESIUM: 2 mg/dL (ref 1.7–2.4)
Magnesium: 1.6 mg/dL — ABNORMAL LOW (ref 1.7–2.4)

## 2015-10-13 MED ORDER — KCL IN DEXTROSE-NACL 20-5-0.45 MEQ/L-%-% IV SOLN
INTRAVENOUS | Status: DC
  Administered 2015-10-13 – 2015-10-14 (×2): via INTRAVENOUS
  Filled 2015-10-13 (×3): qty 1000

## 2015-10-13 MED ORDER — LEVOTHYROXINE SODIUM 50 MCG PO TABS
50.0000 ug | ORAL_TABLET | Freq: Every day | ORAL | Status: DC
  Administered 2015-10-14 – 2015-10-22 (×9): 50 ug via ORAL
  Filled 2015-10-13 (×11): qty 1

## 2015-10-13 MED ORDER — ALBUMIN HUMAN 25 % IV SOLN
INTRAVENOUS | Status: AC
  Administered 2015-10-13: 19:00:00
  Filled 2015-10-13: qty 50

## 2015-10-13 MED ORDER — ALBUMIN HUMAN 25 % IV SOLN
25.0000 g | Freq: Once | INTRAVENOUS | Status: AC
  Administered 2015-10-13: 25 g via INTRAVENOUS
  Filled 2015-10-13: qty 50

## 2015-10-13 MED ORDER — MAGNESIUM SULFATE 2 GM/50ML IV SOLN
2.0000 g | Freq: Once | INTRAVENOUS | Status: AC
  Administered 2015-10-13: 2 g via INTRAVENOUS
  Filled 2015-10-13: qty 50

## 2015-10-13 NOTE — Progress Notes (Signed)
CRITICAL VALUE ALERT  Critical value received:  Calcium 6.4  Date of notification:  2/18  Time of notification:  [id number]  Critical value read back:Yes.    Nurse who received alert:  M.Delphia Kaylor  MD notified (1st page):  Dr. Jimmy Footman  Time of first page:  [phone]  MD notified (2nd page):  Time of second page:  Responding MD:    Time MD responded:

## 2015-10-13 NOTE — Progress Notes (Signed)
[facility] Progress Note  Patient: Felicia Richmond [id number]   PCP: Nance Pear., NP DOB: 25-Apr-1961   DOA: 10/02/2015   DOS: 10/13/2015   Date of Service: the patient was seen and examined on 10/13/2015  Subjective: Patient is morning is more lethargic. Occasional nausea is still present. As per the nurse the patient vomits every time after she has a vancomycin dose. She is tolerating diet otherwise. Nutrition: Tolerating diet Activity: Bedridden present Last BM: 10/12/2015  Assessment and Plan: 1. C. difficile diarrhea, sepsis Lymphocytic colitis. Patient has persistent diarrhea with a biopsy-proven lymphocytic colitis, she was on Entocort. This was further worsened with combination of ipilimumab side effect as well as C. difficile infection. She was started on oral vancomycin despite which there was not improvement and therefore she was started on Flagyl. Oncology has started the patient on Remicade, she received first dose 10/10/2015. Appreciate input from all the consultants. Oncology has started the patient on IV steroids. Critical care was consulted and the patient was under their care for 10/12/2015. Patient was started on bicarbonate drip as well as LR bolus. Unk [employer] informed that the bicarbonate drip can be discontinued. Primary goal is to match the urine output as well as stool output. Her stool volume has also reduced significantly and therefore we will also discontinue the bolus fluid.  strict ins and outs. GI may consider flexible sigmoidoscopy for this patient in near future if there is no improvement.  With lactic acidosis, hypotension, tachycardia the patient is meeting criteria for sepsis most likely secondary to C. difficile. It appears significantly stable hemodynamically and therefore we will transfer her out of the stepdown unit to telemetry floor. Zofran as needed, continue morphine and MS Contin at home doses.  2. Hypoglycemia. Blood glucose stable.  Diet has been advanced and patient appears to be stable  3. Urinary retention. Currently has Foley catheter. continuing at present due to her persistent diarrhea as well as requirement for strict ins and outs.  4. Metastatic melanoma. Management per oncology. Pleural fluid cytology negative, shows mild lymphocytes and reactive mesothelial cells.  5. Nausea vomiting with poor oral intake. Appreciate input from nutritional consult. Continue supplements.   6. Hyperthyroidism TSH and free T4 resulted with significantly high TSH 15.32 and undetectable free T4 level. methimazole has been discontinued and the patient has been started on IV Synthroid. We will transition to oral tomorrow.  7. Oral thrush. Completed one week of fluconazole on nystatin  8. Metabolic acidosis. Patient has received IV bicarbonate infusion in the step down. Currently bicarbonate drip will be discontinued and the patient will be placed on oral supplements. With D5 half normal saline with potassium at present.  DVT Prophylaxis: subcutaneous Heparin Nutrition: Regular diet Advance goals of care discussion: Full code at present, will discuss with oncology regarding further input  Brief Summary of Hospitalization:  HPI: As per the H and P dictated on admission, "Felicia Richmond is a 55 y.o. female with history of metastatic melanoma who was recently admitted 2 weeks ago for diarrhea and colonoscopy showed lymphocytic colitis and is on Entocort present to the ER because of recurrence of diarrhea. Patient states over the last 3 days patient is having multiple episodes of diarrhea denies any nausea vomiting but at this time patient is also having abdominal pain diffusely. Denies any antibiotics intake except for Diflucan for oral candidiasis. Denies any fever chills chest pain shortness of breath. Patient has been admitted for further management of dehydration diarrhea and abdominal pain. Patient  is also found to be hypokalemic.  " Daily update, Procedures: None Consultants: Gastroenterology, medical oncology, critical care Antibiotics: Anti-infectives    Start     Dose/Rate Route Frequency Ordered Stop   10/06/15 0800  vancomycin (VANCOCIN) 50 mg/mL oral solution 500 mg     500 mg Oral 4 times per day 10/06/15 0720 10/20/15 0559   10/06/15 0800  metroNIDAZOLE (FLAGYL) IVPB 500 mg     500 mg 100 mL/hr over 60 Minutes Intravenous Every 8 hours 10/06/15 0720 10/20/15 0759   10/03/15 1800  vancomycin (VANCOCIN) 50 mg/mL oral solution 125 mg  Status:  Discontinued     125 mg Oral 4 times daily 10/03/15 1558 10/06/15 0720   10/03/15 1000  fluconazole (DIFLUCAN) tablet 100 mg  Status:  Discontinued     100 mg Oral Daily 10/02/15 2312 10/10/15 0736     Family Communication: Family at bedside all questions answered satisfactorily.  Disposition:  Barriers to safe discharge: Diarrhea   Intake/Output Summary (Last 24 hours) at 10/13/15 1819 Last data filed at 10/13/15 0500  Gross per 24 hour  Intake   2204 ml  Output      0 ml  Net   2204 ml   Filed Weights   10/03/15 1420 10/09/15 2100 10/12/15 0423  Weight: 66.1 kg (145 lb 11.6 oz) 65.4 kg (144 lb 2.9 oz) 79.7 kg (175 lb 11.3 oz)   Objective: Physical Exam: Filed Vitals:   10/13/15 0500 10/13/15 0800 10/13/15 1200 10/13/15 1600  BP: 119/72 118/66 109/63 118/64  Pulse: 93 90 95 91  Temp:  97.6 F (36.4 C) 97.3 F (36.3 C) 97.4 F (36.3 C)  TempSrc:  Oral Oral Axillary  Resp: 10 10 9 18   Height:      Weight:      SpO2: 97% 96% 94% 94%    General: Appear in moderate distress, NO Rash; Oral Mucosa moist. Cardiovascular: S1 and S2 Present, no Murmur, no JVD Respiratory: Bilateral Air entry present and Clear to Auscultation, no Crackles, no wheezes Abdomen: Bowel Sound present, Soft and mild tenderness Extremities: no Pedal edema, no calf tenderness  Data Reviewed: CBC:  Recent Labs Lab 10/08/15 0846 10/10/15 0328 10/11/15 0434 10/12/15 0500  10/13/15 0330  WBC 5.7 8.3 11.0* 15.2* 17.3*  HGB 11.5* 12.3 12.8 9.6* 9.5*  HCT 36.9 39.0 40.7 30.1* 29.3*  MCV 81.6 81.9 81.4 80.7 79.8  PLT 210 211 268 224 [id number]   Basic Metabolic Panel:  Recent Labs Lab 10/09/15 1015 10/10/15 0328 10/11/15 0434  10/11/15 2300 10/12/15 0034 10/12/15 0500 10/12/15 1700 10/13/15 0330  NA 131* 132* 133*  < > 142 136 136 139 136  K 4.3 4.3 5.5*  < > 2.6* 4.3 4.2 4.0 3.7  CL 109 112* 117*  < > 127* 115* 113* 113* 108  CO2 14* 12* 11*  < > 10* 16* 16* 16* 19*  GLUCOSE 112* 139* 71  < > 87 130* 125* 138* 125*  BUN <5* <5* <5*  < > <5* <5* <5* 5* 5*  CREATININE 0.89 0.85 1.05*  < > 0.53 0.92 0.92 0.95 0.98  CALCIUM 6.9* 6.8* 6.8*  < > 4.0* 6.4* 6.5* 6.5* 6.4*  MG 1.6* 2.3 1.8  --   --   --  1.6*  --  1.6*  PHOS 2.1* 2.0* 2.5  --   --   --  2.5  --  2.3*  < > = values in this interval not displayed. Liver Function  Tests:  Recent Labs Lab 10/11/15 0434 10/12/15 0034 10/13/15 0330  AST 15 15  --   ALT 7* 11*  --   ALKPHOS 78 147*  --   BILITOT 0.5 0.7  --   PROT 3.4* <3.0*  --   ALBUMIN 1.6* 1.5* 1.5*   No results for input(s): LIPASE, AMYLASE in the last 168 hours. No results for input(s): AMMONIA in the last 168 hours.  Cardiac Enzymes: No results for input(s): CKTOTAL, CKMB, CKMBINDEX, TROPONINI in the last 168 hours.  BNP (last 3 results) No results for input(s): BNP in the last 8760 hours.  CBG:  Recent Labs Lab 10/08/15 0614 10/08/15 1215 10/08/15 1902 10/08/15 2353 10/09/15 0627  GLUCAP 117* 88 97 89 106*    No results found for this or any previous visit (from the past 240 hour(s)).   Studies: No results found.   Scheduled Meds: . albumin human  25 g Intravenous Once  . famotidine (PEPCID) IV  20 mg Intravenous BID  . feeding supplement  1 Container Oral TID BM  . heparin subcutaneous  5,000 Units Subcutaneous 3 times per day  . levothyroxine  25 mcg Intravenous Daily  . methylPREDNISolone (SOLU-MEDROL)  injection  40 mg Intravenous Daily  . metronidazole  500 mg Intravenous Q8H  . morphine  15 mg Oral Q12H  . nystatin  5 mL Oral QID  . ondansetron (ZOFRAN) IV  8 mg Intravenous 3 times per day  . vancomycin  500 mg Oral 4 times per day   Continuous Infusions: .  sodium bicarbonate  infusion 1000 mL 125 mL/hr at 10/12/15 2000   PRN Meds: acetaminophen **OR** acetaminophen, liver oil-zinc oxide, LORazepam, morphine, sodium chloride flush  Time spent: 30 minutes  Author: Berle Mull, MD [facility] Pager: [phone] 10/13/2015 6:19 PM  If 7PM-7AM, please contact night-coverage at [email or web address], password [facility]

## 2015-10-13 NOTE — Progress Notes (Signed)
The patient states that her diarrhea is much better now. She had one stool last night and one this morning and is noticing a little form to it. She is tolerating her diet.  Abdomen soft nontender  Impression: Diarrhea, this seems to be improving Plan:Continue current management

## 2015-10-14 LAB — RENAL FUNCTION PANEL
ANION GAP: 7 (ref 5–15)
Albumin: 2.1 g/dL — ABNORMAL LOW (ref 3.5–5.0)
BUN: 6 mg/dL (ref 6–20)
CO2: 21 mmol/L — AB (ref 22–32)
Calcium: 6.7 mg/dL — ABNORMAL LOW (ref 8.9–10.3)
Chloride: 111 mmol/L (ref 101–111)
Creatinine, Ser: 0.97 mg/dL (ref 0.44–1.00)
GFR calc non Af Amer: >60 mL/min (ref >60)
GLUCOSE: 91 mg/dL (ref 65–99)
POTASSIUM: 4 mmol/L (ref 3.5–5.1)
Phosphorus: 2.5 mg/dL (ref 2.5–4.6)
Sodium: 139 mmol/L (ref 135–145)

## 2015-10-14 LAB — CBC
HEMATOCRIT: 27.5 % — AB (ref 36.0–46.0)
HEMOGLOBIN: 8.9 g/dL — AB (ref 12.0–15.0)
MCH: 26.1 pg (ref 26.0–34.0)
MCHC: 32.4 g/dL (ref 30.0–36.0)
MCV: 80.6 fL (ref 78.0–100.0)
Platelets: 206 10*3/uL (ref 150–400)
RBC: 3.41 MIL/uL — AB (ref 3.87–5.11)
RDW: 21.2 % — ABNORMAL HIGH (ref 11.5–15.5)
WBC: 15.6 10*3/uL — ABNORMAL HIGH (ref 4.0–10.5)

## 2015-10-14 LAB — MAGNESIUM: Magnesium: 2.1 mg/dL (ref 1.7–2.4)

## 2015-10-14 MED ORDER — ONDANSETRON HCL 4 MG/2ML IJ SOLN
4.0000 mg | Freq: Four times a day (QID) | INTRAMUSCULAR | Status: DC | PRN
  Administered 2015-10-14 – 2015-10-15 (×3): 4 mg via INTRAVENOUS
  Filled 2015-10-14 (×3): qty 2

## 2015-10-14 MED ORDER — SODIUM CHLORIDE 0.9 % IV SOLN: INTRAVENOUS | Status: DC

## 2015-10-14 MED ORDER — SODIUM CHLORIDE 0.9 % IV SOLN
8.0000 mg | Freq: Once | INTRAVENOUS | Status: AC
  Administered 2015-10-14: 8 mg via INTRAVENOUS
  Filled 2015-10-14: qty 4

## 2015-10-14 MED ORDER — KCL IN DEXTROSE-NACL 20-5-0.45 MEQ/L-%-% IV SOLN
INTRAVENOUS | Status: DC
  Administered 2015-10-14 – 2015-10-15 (×2): via INTRAVENOUS
  Filled 2015-10-14 (×2): qty 1000

## 2015-10-14 MED ORDER — PRO-STAT SUGAR FREE PO LIQD
30.0000 mL | Freq: Three times a day (TID) | ORAL | Status: DC
  Administered 2015-10-14 – 2015-10-15 (×2): 30 mL via ORAL
  Filled 2015-10-14 (×5): qty 30

## 2015-10-14 MED ORDER — RANITIDINE HCL 150 MG/10ML PO SYRP
150.0000 mg | ORAL_SOLUTION | Freq: Two times a day (BID) | ORAL | Status: DC
  Administered 2015-10-14 – 2015-10-22 (×9): 150 mg via ORAL
  Filled 2015-10-14 (×18): qty 10

## 2015-10-14 MED ORDER — FAMOTIDINE 40 MG/5ML PO SUSR: 20.0000 mg | Freq: Two times a day (BID) | ORAL | Status: DC

## 2015-10-14 NOTE — Progress Notes (Signed)
The patient is being treated for multifactorial diarrhea secondary to immunomodulators, C. difficile, and lymphocytic colitis. It has been felt that they are all playing a role. She is currently under treatment for these. She feels like her diarrhea is slowing down. There is more consistency. We have nothing more to recommend at this time. Continue medical care. We will sign off. Call us if needed.

## 2015-10-14 NOTE — Progress Notes (Signed)
[facility] Progress Note  Patient: Felicia Richmond [id number]   PCP: Nance Pear., NP DOB: September 19, 1960   DOA: 10/02/2015   DOS: 10/14/2015   Date of Service: the patient was seen and examined on 10/14/2015  Subjective: Patient remains awake and oriented and does not have any good complaint other than nausea that occurs primarily and only with vancomycin ingestion. Does not have any complaint of chest pain shortness of breath or cough. Comparison significant fatigue and tiredness and lethargy. Nutrition: Tolerating diet Activity: Bedridden present Last BM: 10/14/2015  Assessment and Plan: 1. C. difficile diarrhea, sepsis Lymphocytic colitis. Patient has persistent diarrhea with a biopsy-proven lymphocytic colitis, she was on Entocort. This was further worsened with combination of ipilimumab side effect as well as C. difficile infection. She was started on oral vancomycin despite which there was not improvement and therefore she was started on Flagyl. With lactic acidosis, hypotension, tachycardia the patient is meeting criteria for sepsis most likely secondary to C. difficile.  Oncology has started the patient on Remicade, she received first dose 10/10/2015. Appreciate input from all the consultants. Oncology has started the patient on IV steroids. Critical care was consulted and the patient was under their care for 10/12/2015. Patient was started on bicarbonate drip as well as LR bolus. The patient has reoccurrence of the diarrhea along with persistent nausea today. Discussed with GI recommended to discontinue oral vancomycin and recheck C. difficile PCR. If the PCR is positive for the toxin then they recommended to start the patient on Fidoxamycin, and if the toxin PCR is negative then recommend to continue current management. Discontinue scheduled Zofran and use when necessary Zofran. Reduce IV fluid rate. strict ins and outs.  2. Hypoglycemia. Blood glucose stable. Diet has  been advanced and patient appears to be stable  3. Urinary retention. Foley catheter has been removed and after initial episode of retention the patient has been successfully able to urinate on her own.  4. Metastatic melanoma. Management per oncology. Pleural fluid cytology negative, shows mild lymphocytes and reactive mesothelial cells.  5. Nausea vomiting with poor oral intake. Appreciate input from nutritional consult. Continue supplements. Added pro-state today  6. Hyperthyroidism TSH and free T4 resulted with significantly high TSH 15.32 and undetectable free T4 level. methimazole has been discontinued and the patient has been started on IV Synthroid. Transition to oral Synthroid today  7. Oral thrush. Completed one week of fluconazole on nystatin  8. Metabolic acidosis. Patient has received IV bicarbonate infusion in the step down. Currently stable With D5 half normal saline with potassium at present.  DVT Prophylaxis: subcutaneous Heparin Nutrition: Regular diet Advance goals of care discussion: Full code at present, will discuss with oncology regarding further input  Brief Summary of Hospitalization:  HPI: As per the H and P dictated on admission, "Felicia Richmond is a 55 y.o. female with history of metastatic melanoma who was recently admitted 2 weeks ago for diarrhea and colonoscopy showed lymphocytic colitis and is on Entocort present to the ER because of recurrence of diarrhea. Patient states over the last 3 days patient is having multiple episodes of diarrhea denies any nausea vomiting but at this time patient is also having abdominal pain diffusely. Denies any antibiotics intake except for Diflucan for oral candidiasis. Denies any fever chills chest pain shortness of breath. Patient has been admitted for further management of dehydration diarrhea and abdominal pain. Patient is also found to be hypokalemic. " Daily update, Procedures: None Consultants: Gastroenterology,  medical oncology, critical  care Antibiotics: Anti-infectives    Start     Dose/Rate Route Frequency Ordered Stop   10/06/15 0800  vancomycin (VANCOCIN) 50 mg/mL oral solution 500 mg  Status:  Discontinued     500 mg Oral 4 times per day 10/06/15 0720 10/14/15 1334   10/06/15 0800  metroNIDAZOLE (FLAGYL) IVPB 500 mg     500 mg 100 mL/hr over 60 Minutes Intravenous Every 8 hours 10/06/15 0720 10/20/15 0759   10/03/15 1800  vancomycin (VANCOCIN) 50 mg/mL oral solution 125 mg  Status:  Discontinued     125 mg Oral 4 times daily 10/03/15 1558 10/06/15 0720   10/03/15 1000  fluconazole (DIFLUCAN) tablet 100 mg  Status:  Discontinued     100 mg Oral Daily 10/02/15 2312 10/10/15 0736     Family Communication: Family at bedside all questions answered satisfactorily.  Disposition:  Barriers to safe discharge: Diarrhea   Intake/Output Summary (Last 24 hours) at 10/14/15 1619 Last data filed at 10/14/15 0400  Gross per 24 hour  Intake      0 ml  Output    600 ml  Net   -600 ml   Filed Weights   10/03/15 1420 10/09/15 2100 10/12/15 0423  Weight: 66.1 kg (145 lb 11.6 oz) 65.4 kg (144 lb 2.9 oz) 79.7 kg (175 lb 11.3 oz)   Objective: Physical Exam: Filed Vitals:   10/13/15 1932 10/13/15 2201 10/14/15 0642 10/14/15 1531  BP: 134/85 110/67 108/64 128/71  Pulse: 99 91 96 94  Temp: 97.3 F (36.3 C) 97.4 F (36.3 C) 97.5 F (36.4 C) 97.7 F (36.5 C)  TempSrc: Axillary Oral  Oral  Resp: 18 18 20 20   Height:      Weight:      SpO2: 95% 94% 94% 96%    General: Appear in moderate distress, NO Rash; Oral Mucosa moist. Cardiovascular: S1 and S2 Present, no Murmur, no JVD Respiratory: Bilateral Air entry present and Clear to Auscultation, no Crackles, no wheezes Abdomen: Bowel Sound present, Soft and mild tenderness Extremities: no Pedal edema, no calf tenderness  Data Reviewed: CBC:  Recent Labs Lab 10/10/15 0328 10/11/15 0434 10/12/15 0500 10/13/15 0330 10/14/15 0436  WBC 8.3  11.0* 15.2* 17.3* 15.6*  HGB 12.3 12.8 9.6* 9.5* 8.9*  HCT 39.0 40.7 30.1* 29.3* 27.5*  MCV 81.9 81.4 80.7 79.8 80.6  PLT 211 268 224 227 [id number]   Basic Metabolic Panel:  Recent Labs Lab 10/10/15 0328 10/11/15 0434  10/12/15 0500 10/12/15 1700 10/13/15 0330 10/13/15 2054 10/14/15 0436  NA 132* 133*  < > 136 139 136 136 139  K 4.3 5.5*  < > 4.2 4.0 3.7 3.8 4.0  CL 112* 117*  < > 113* 113* 108 107 111  CO2 12* 11*  < > 16* 16* 19* 21* 21*  GLUCOSE 139* 71  < > 125* 138* 125* 115* 91  BUN <5* <5*  < > <5* 5* 5* 6 6  CREATININE 0.85 1.05*  < > 0.92 0.95 0.98 0.95 0.97  CALCIUM 6.8* 6.8*  < > 6.5* 6.5* 6.4* 6.6* 6.7*  MG 2.3 1.8  --  1.6*  --  1.6* 2.0 2.1  PHOS 2.0* 2.5  --  2.5  --  2.3*  --  2.5  < > = values in this interval not displayed. Liver Function Tests:  Recent Labs Lab 10/11/15 0434 10/12/15 0034 10/13/15 0330 10/14/15 0436  AST 15 15  --   --   ALT 7* 11*  --   --  ALKPHOS 78 147*  --   --   BILITOT 0.5 0.7  --   --   PROT 3.4* <3.0*  --   --   ALBUMIN 1.6* 1.5* 1.5* 2.1*   No results for input(s): LIPASE, AMYLASE in the last 168 hours. No results for input(s): AMMONIA in the last 168 hours.  Cardiac Enzymes: No results for input(s): CKTOTAL, CKMB, CKMBINDEX, TROPONINI in the last 168 hours.  BNP (last 3 results) No results for input(s): BNP in the last 8760 hours.  CBG:  Recent Labs Lab 10/08/15 0614 10/08/15 1215 10/08/15 1902 10/08/15 2353 10/09/15 0627  GLUCAP 117* 88 97 89 106*    No results found for this or any previous visit (from the past 240 hour(s)).   Studies: No results found.   Scheduled Meds: . feeding supplement  1 Container Oral TID BM  . feeding supplement (PRO-STAT SUGAR FREE 64)  30 mL Oral TID  . heparin subcutaneous  5,000 Units Subcutaneous 3 times per day  . levothyroxine  50 mcg Oral QAC breakfast  . methylPREDNISolone (SOLU-MEDROL) injection  40 mg Intravenous Daily  . metronidazole  500 mg Intravenous Q8H  .  morphine  15 mg Oral Q12H  . nystatin  5 mL Oral QID  . ranitidine  150 mg Oral BID   Continuous Infusions: . dextrose 5 % and 0.45 % NaCl with KCl 20 mEq/L 75 mL/hr at 10/14/15 1535   PRN Meds: acetaminophen **OR** acetaminophen, liver oil-zinc oxide, morphine, ondansetron (ZOFRAN) IV, sodium chloride flush  Time spent: 30 minutes  Author: Berle Mull, MD [facility] Pager: [phone] 10/14/2015 4:19 PM  If 7PM-7AM, please contact night-coverage at [email or web address], password [facility]

## 2015-10-14 NOTE — Progress Notes (Signed)
Patient was pleased to void in bedpan. Urine appeared amber colored but was mixed with small amount watery stool. I would estimate about 300-400 cc urine output.

## 2015-10-15 ENCOUNTER — Inpatient Hospital Stay (HOSPITAL_COMMUNITY): Payer: BLUE CROSS/BLUE SHIELD

## 2015-10-15 DIAGNOSIS — J9601 Acute respiratory failure with hypoxia: Secondary | ICD-10-CM | POA: Diagnosis present

## 2015-10-15 LAB — BLOOD GAS, ARTERIAL
Acid-base deficit: 5.1 mmol/L — ABNORMAL HIGH (ref 0.0–2.0)
BICARBONATE: 18.4 meq/L — AB (ref 20.0–24.0)
DRAWN BY: 280981
O2 Content: 4 L/min
O2 Saturation: 91.2 %
PH ART: 7.395 (ref 7.350–7.450)
PO2 ART: 64.2 mmHg — AB (ref 80.0–100.0)
Patient temperature: 98.6
TCO2: 16.9 mmol/L (ref 0–100)
pCO2 arterial: 30.7 mmHg — ABNORMAL LOW (ref 35.0–45.0)

## 2015-10-15 LAB — CBC WITH DIFFERENTIAL/PLATELET
BASOS PCT: 0 %
Basophils Absolute: 0 10*3/uL (ref 0.0–0.1)
EOS ABS: 0 10*3/uL (ref 0.0–0.7)
EOS PCT: 0 %
HCT: 32.7 % — ABNORMAL LOW (ref 36.0–46.0)
HEMOGLOBIN: 10.3 g/dL — AB (ref 12.0–15.0)
LYMPHS PCT: 7 %
Lymphs Abs: 0.8 10*3/uL (ref 0.7–4.0)
MCH: 25.6 pg — AB (ref 26.0–34.0)
MCHC: 31.5 g/dL (ref 30.0–36.0)
MCV: 81.3 fL (ref 78.0–100.0)
Monocytes Absolute: 0.8 10*3/uL (ref 0.1–1.0)
Monocytes Relative: 7 %
NEUTROS ABS: 10.5 10*3/uL — AB (ref 1.7–7.7)
Neutrophils Relative %: 86 %
Platelets: 275 10*3/uL (ref 150–400)
RBC: 4.02 MIL/uL (ref 3.87–5.11)
RDW: 21.2 % — ABNORMAL HIGH (ref 11.5–15.5)
WBC: 12.1 10*3/uL — ABNORMAL HIGH (ref 4.0–10.5)

## 2015-10-15 LAB — CBC
HEMATOCRIT: 30.6 % — AB (ref 36.0–46.0)
HEMOGLOBIN: 9.6 g/dL — AB (ref 12.0–15.0)
MCH: 25.5 pg — ABNORMAL LOW (ref 26.0–34.0)
MCHC: 31.4 g/dL (ref 30.0–36.0)
MCV: 81.2 fL (ref 78.0–100.0)
Platelets: 249 10*3/uL (ref 150–400)
RBC: 3.77 MIL/uL — AB (ref 3.87–5.11)
RDW: 21.4 % — ABNORMAL HIGH (ref 11.5–15.5)
WBC: 16 10*3/uL — ABNORMAL HIGH (ref 4.0–10.5)

## 2015-10-15 LAB — COMPREHENSIVE METABOLIC PANEL
ALK PHOS: 348 U/L — AB (ref 38–126)
ALT: 13 U/L — ABNORMAL LOW (ref 14–54)
ANION GAP: 7 (ref 5–15)
AST: 18 U/L (ref 15–41)
Albumin: 2.3 g/dL — ABNORMAL LOW (ref 3.5–5.0)
BILIRUBIN TOTAL: 0.6 mg/dL (ref 0.3–1.2)
BUN: 5 mg/dL — ABNORMAL LOW (ref 6–20)
CALCIUM: 6.7 mg/dL — AB (ref 8.9–10.3)
CO2: 20 mmol/L — AB (ref 22–32)
Chloride: 108 mmol/L (ref 101–111)
Creatinine, Ser: 0.82 mg/dL (ref 0.44–1.00)
Glucose, Bld: 89 mg/dL (ref 65–99)
Potassium: 3.9 mmol/L (ref 3.5–5.1)
SODIUM: 135 mmol/L (ref 135–145)
TOTAL PROTEIN: 4.1 g/dL — AB (ref 6.5–8.1)

## 2015-10-15 LAB — RENAL FUNCTION PANEL
ALBUMIN: 2 g/dL — AB (ref 3.5–5.0)
ANION GAP: 8 (ref 5–15)
BUN: 5 mg/dL — ABNORMAL LOW (ref 6–20)
CALCIUM: 6.7 mg/dL — AB (ref 8.9–10.3)
CO2: 21 mmol/L — ABNORMAL LOW (ref 22–32)
Chloride: 108 mmol/L (ref 101–111)
Creatinine, Ser: 0.87 mg/dL (ref 0.44–1.00)
Glucose, Bld: 86 mg/dL (ref 65–99)
Phosphorus: 2.2 mg/dL — ABNORMAL LOW (ref 2.5–4.6)
Potassium: 4.1 mmol/L (ref 3.5–5.1)
SODIUM: 137 mmol/L (ref 135–145)

## 2015-10-15 LAB — C DIFFICILE QUICK SCREEN W PCR REFLEX
C Diff antigen: NEGATIVE
C Diff interpretation: NEGATIVE
C Diff toxin: NEGATIVE

## 2015-10-15 LAB — MAGNESIUM: MAGNESIUM: 1.9 mg/dL (ref 1.7–2.4)

## 2015-10-15 MED ORDER — IPRATROPIUM BROMIDE 0.02 % IN SOLN: 0.5000 mg | Freq: Four times a day (QID) | RESPIRATORY_TRACT | Status: DC | PRN

## 2015-10-15 MED ORDER — METHYLPREDNISOLONE SODIUM SUCC 40 MG IJ SOLR
40.0000 mg | INTRAMUSCULAR | Status: DC
  Administered 2015-10-15 – 2015-10-17 (×3): 40 mg via INTRAVENOUS
  Filled 2015-10-15 (×4): qty 1

## 2015-10-15 MED ORDER — LEVALBUTEROL HCL 0.63 MG/3ML IN NEBU
0.6300 mg | INHALATION_SOLUTION | Freq: Four times a day (QID) | RESPIRATORY_TRACT | Status: DC
  Filled 2015-10-15: qty 3

## 2015-10-15 MED ORDER — ALBUMIN HUMAN 25 % IV SOLN
12.5000 g | Freq: Once | INTRAVENOUS | Status: AC
  Administered 2015-10-15: 12.5 g via INTRAVENOUS
  Filled 2015-10-15: qty 50

## 2015-10-15 MED ORDER — CORTISONE ACETATE 25 MG PO TABS
25.0000 mg | ORAL_TABLET | Freq: Two times a day (BID) | ORAL | Status: DC
  Filled 2015-10-15 (×3): qty 1

## 2015-10-15 MED ORDER — METOPROLOL TARTRATE 1 MG/ML IV SOLN
2.5000 mg | Freq: Once | INTRAVENOUS | Status: AC
  Administered 2015-10-15: 2.5 mg via INTRAVENOUS
  Filled 2015-10-15: qty 5

## 2015-10-15 MED ORDER — IOHEXOL 350 MG/ML SOLN
100.0000 mL | Freq: Once | INTRAVENOUS | Status: AC | PRN
  Administered 2015-10-15: 100 mL via INTRAVENOUS

## 2015-10-15 MED ORDER — IPRATROPIUM BROMIDE 0.02 % IN SOLN
0.5000 mg | Freq: Four times a day (QID) | RESPIRATORY_TRACT | Status: DC
  Filled 2015-10-15: qty 2.5

## 2015-10-15 MED ORDER — IPRATROPIUM BROMIDE 0.02 % IN SOLN
0.5000 mg | Freq: Four times a day (QID) | RESPIRATORY_TRACT | Status: DC
  Administered 2015-10-15: 0.5 mg via RESPIRATORY_TRACT

## 2015-10-15 MED ORDER — FUROSEMIDE 10 MG/ML IJ SOLN
40.0000 mg | Freq: Once | INTRAMUSCULAR | Status: AC
  Administered 2015-10-15: 40 mg via INTRAVENOUS
  Filled 2015-10-15: qty 4

## 2015-10-15 MED ORDER — HYDROCORTISONE 20 MG PO TABS
20.0000 mg | ORAL_TABLET | Freq: Two times a day (BID) | ORAL | Status: DC
  Administered 2015-10-15 (×2): 20 mg via ORAL
  Filled 2015-10-15 (×3): qty 1

## 2015-10-15 MED ORDER — LEVALBUTEROL HCL 0.63 MG/3ML IN NEBU
0.6300 mg | INHALATION_SOLUTION | Freq: Four times a day (QID) | RESPIRATORY_TRACT | Status: DC
  Administered 2015-10-15: 0.63 mg via RESPIRATORY_TRACT

## 2015-10-15 MED ORDER — ENSURE ENLIVE PO LIQD: 237.0000 mL | Freq: Two times a day (BID) | ORAL | Status: DC

## 2015-10-15 MED ORDER — LEVALBUTEROL HCL 0.63 MG/3ML IN NEBU
0.6300 mg | INHALATION_SOLUTION | Freq: Four times a day (QID) | RESPIRATORY_TRACT | Status: DC | PRN
  Administered 2015-10-16: 0.63 mg via RESPIRATORY_TRACT
  Filled 2015-10-15: qty 3

## 2015-10-15 NOTE — Progress Notes (Signed)
Felicia Richmond continues to improve. She's had no diarrhea. I really think that the C. difficile was not a major problem for her. 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  She probably has gained quite a bit of weight because of all the fluid that she is being given. She really needs to be mobilized to try to help with the fluid retention   In fact, she has not had a bowel movement for a day or so now.  She's having some discomfort under the rib cage is bilaterally. 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  I talked to her today about the melanoma. I told her that looked like the scans show that the melanoma was worsening. I told her that it is going be very hard for [place] to treat her. She does understand this. She realizes that the melanoma will be the reason that she passes on. I told her that I just did not know when that would be.  It would be nice if we could get her back on BRAF inhibitor therapy. I know she was on this previously. We might do to try some other combination of medications.  For now, the main goal is to try to get her more functional. Hopefully, physical therapy will do this.  She's had no bleeding. She's had no fever. She's had no rashes.   Her labs look okay. Her potassium is 4.1. Her calcium is 6.7 with an albumin of 2.0. Her white cell count is 16. Her hemoglobin is 9.6. Platelet count 249,000.  On her physical exam, her vital signs were pretty stable. She is afebrile. Blood pressure is 127/77. Her hent exam shows no ocular or oral lesions. She has no scleral icterus. There is no adenopathy in the neck. Lungs are with slight decrease in the bases. Cardiac exam regular rate and rhythm with no murmurs, rubs or bruits. Abdomen is soft. There is no guarding or rebound tenderness. Bowel sounds might be slightly decreased. Extremities shows some edema bilaterally in her legs.  For now, I think that we  does have to get her more functional more independent so she will be over go home. Hopefully, physical therapy will come on today.  I would think the Flagyl could be stopped.   Again, I very much appreciate the wonderful care that she is getting from everybody.  Pete E.  Isaiah 41:10

## 2015-10-15 NOTE — Evaluation (Signed)
Physical Therapy Evaluation Patient Details Name: Reganne Messerschmidt MRN: [id number] DOB: [birth year] Today's Date: 10/15/2015   History of Present Illness  55 yo female admitted with C diff diarrhea. hx of cancer (met melanoma), emphysema, Meniere's disease, HTN, CVA,   Clinical Impression  On eval, pt required Mod assist for mobility-pt was able to perform a stand pivot transfer with RW and she took a few steps in room. Pt is weak and fatigues easily. At this time, recommend ST rehab at SNF if pt is agreeable. If pt does not agree to placement, she will need 24 hour supervision/assist and HHPT.    Follow Up Recommendations SNF    Equipment Recommendations  Rolling walker with 5" wheels (youth height)    Recommendations for Other Services       Precautions / Restrictions Precautions Precautions: Fall Restrictions Weight Bearing Restrictions: No      Mobility  Bed Mobility Overal bed mobility: Needs Assistance Bed Mobility: Supine to Sit     Supine to sit: HOB elevated;Mod assist     General bed mobility comments: Assist for trunk and bil LEs. Utilized bedpad for scooting to EOB. Moderate reliance on bedrail. Increased time.   Transfers Overall transfer level: Needs assistance Equipment used: Rolling walker (2 wheeled) Transfers: Sit to/from [facility] Sit to Stand: Mod assist;From elevated surface Stand pivot transfers: Min assist       General transfer comment: assist to rise, stabilize, control descent. VCs safety, hand placement. Stand pivot from bed to recliner with rW  Ambulation/Gait Ambulation/Gait assistance: Min assist Ambulation Distance (Feet): 5 Feet Assistive device: Rolling walker (2 wheeled) Gait Pattern/deviations: Decreased stride length;Trunk flexed;Step-to pattern;Decreased step length - left;Decreased step length - right     General Gait Details: Assist to stabilize pt and maneuver safely with RW. Very short, choppy steps noted. Pt  fatigues quickly/easily.   Stairs            Wheelchair Mobility    Modified Rankin (Stroke Patients Only)       Balance Overall balance assessment: Needs assistance         Standing balance support: Bilateral upper extremity supported;During functional activity Standing balance-Leahy Scale: Poor                               Pertinent Vitals/Pain Pain Assessment: 0-10 Pain Score: 6  Pain Location: back, belly Pain Intervention(s): Limited activity within patient's tolerance    Home Living Family/patient expects to be discharged to:: Private residence Living Arrangements: Spouse/significant other (works during day)   Type of Home: House Home Access: Stairs to enter Entrance Stairs-Rails: None Technical brewer of Steps: 3 Home Layout: One level;Laundry or work area in [employer]: None      Prior Function Level of Independence: Needs assistance      ADL's / Homemaking Assistance Needed: husband assisted with bathing, dressing        Hand Dominance        Extremity/Trunk Assessment   Upper Extremity Assessment:  (R UE edema)           Lower Extremity Assessment: Generalized weakness (bil LE edema)      Cervical / Trunk Assessment: Normal  Communication   Communication: No difficulties  Cognition Arousal/Alertness: Awake/alert Behavior During Therapy: WFL for tasks assessed/performed Overall Cognitive Status: Within Functional Limits for tasks assessed  General Comments      Exercises        Assessment/Plan    PT Assessment Patient needs continued PT services  PT Diagnosis Difficulty walking;Generalized weakness;Acute pain   PT Problem List Decreased strength;Decreased activity tolerance;Decreased balance;Decreased mobility;Obesity;Pain;Decreased knowledge of use of DME  PT Treatment Interventions DME instruction;Gait training;Functional mobility training;Patient/family  education;Balance training;Therapeutic exercise;Therapeutic activities   PT Goals (Current goals can be found in the Care Plan section) Acute Rehab PT Goals Patient Stated Goal: less pain. get better PT Goal Formulation: With patient Time For Goal Achievement: 10/29/15 Potential to Achieve Goals: Good    Frequency Min 3X/week   Barriers to discharge        Co-evaluation               End of Session   Activity Tolerance: Patient limited by fatigue;Patient limited by pain Patient left: in chair;with call bell/phone within reach           Time: 1133-1202 PT Time Calculation (min) (ACUTE ONLY): 29 min   Charges:   PT Evaluation $PT Eval Moderate Complexity: 1 Procedure PT Treatments $Therapeutic Activity: 8-22 mins   PT G Codes:        Weston Anna, MPT Pager: [phone]

## 2015-10-15 NOTE — Progress Notes (Signed)
[facility] Progress Note  Patient: Felicia Richmond [id number]   PCP: Nance Pear., NP DOB: 1960/09/04   DOA: 10/02/2015   DOS: 10/15/2015   Date of Service: the patient was seen and examined on 10/15/2015  Subjective: Patient continues to have 2-3 loose watery bowel movement. The nausea and the vomiting has improved. Bowel movement are now more formed. Later in the outcome the patient started having episodes of shortness of breath with tachycardia as well as chest pain. Nutrition: Tolerating diet Activity: Bedridden present Last BM: 10/14/2015  Assessment and Plan: 1. C. difficile diarrhea, sepsis Lymphocytic colitis. Patient has persistent diarrhea with a biopsy-proven lymphocytic colitis, she was on Entocort. This was further worsened with combination of ipilimumab side effect as well as C. difficile infection. She was started on oral vancomycin despite which there was not improvement and therefore she was started on Flagyl. With lactic acidosis, hypotension, tachycardia the patient is meeting criteria for sepsis most likely secondary to C. difficile.  Oncology has started the patient on Remicade, she received first dose 10/10/2015. Appreciate input from all the consultants. Oncology has started the patient on IV steroids. Critical care was consulted and the patient was under their care for 10/12/2015. Patient was started on bicarbonate drip as well as LR bolus. The patient has reoccurrence of the diarrhea along with persistent nausea, and after discussion with the gastroenterology as well as oncology her vancomycin as well as Flagyl has been discontinued. Repeat C. difficile is negative  2. Acute hypoxic respiratory failure Today patient gradually started having worsening heart rate with sinus tachycardia. She is currently requiring 4 L of oxygen. ABG shows hypoxia. Due to her prolonged hospitalization a CT chest PE protocol was performed which was negative for any pulmonary  embolism and it did show presence of bilateral pleural effusion with worsening as well as patchy infiltrate. We'll check sputum culture. Increase Solu-Medrol 40 mg daily. Put her on duo nebs. Check influenza PCR. Holding off on placing the patient on empiric antibiotic given her recent C. Difficile. We will give her one dose of IV albumin followed by one dose of IV Lasix and monitor ins and outs.  3. Urinary retention. Foley catheter has been removed and after initial episode of retention the patient has been successfully able to urinate on her own.  4. Metastatic melanoma. Management per oncology. Pleural fluid cytology negative, shows mild lymphocytes and reactive mesothelial cells.  5. Nausea vomiting with poor oral intake. Appreciate input from nutritional consult. Continue supplements. Added pro-state today  6. Hyperthyroidism TSH and free T4 resulted with significantly high TSH 15.32 and undetectable free T4 level. methimazole has been discontinued and the patient has been started on IV Synthroid. Transition to oral Synthroid today  7. Oral thrush. Completed one week of fluconazole on nystatin  8. Metabolic acidosis. Patient has received IV bicarbonate infusion in the step down.   DVT Prophylaxis: subcutaneous Heparin Nutrition: Regular diet Advance goals of care discussion: Full code at present, will discuss with oncology regarding further input  Brief Summary of Hospitalization:  HPI: As per the H and P dictated on admission, "Felicia Richmond is a 55 y.o. female with history of metastatic melanoma who was recently admitted 2 weeks ago for diarrhea and colonoscopy showed lymphocytic colitis and is on Entocort present to the ER because of recurrence of diarrhea. Patient states over the last 3 days patient is having multiple episodes of diarrhea denies any nausea vomiting but at this time patient is also having abdominal  pain diffusely. Denies any antibiotics intake except for  Diflucan for oral candidiasis. Denies any fever chills chest pain shortness of breath. Patient has been admitted for further management of dehydration diarrhea and abdominal pain. Patient is also found to be hypokalemic. " Daily update, Procedures: None Consultants: Gastroenterology, medical oncology, critical care Antibiotics: Anti-infectives    Start     Dose/Rate Route Frequency Ordered Stop   10/06/15 0800  vancomycin (VANCOCIN) 50 mg/mL oral solution 500 mg  Status:  Discontinued     500 mg Oral 4 times per day 10/06/15 0720 10/14/15 1334   10/06/15 0800  metroNIDAZOLE (FLAGYL) IVPB 500 mg  Status:  Discontinued     500 mg 100 mL/hr over 60 Minutes Intravenous Every 8 hours 10/06/15 0720 10/15/15 0804   10/03/15 1800  vancomycin (VANCOCIN) 50 mg/mL oral solution 125 mg  Status:  Discontinued     125 mg Oral 4 times daily 10/03/15 1558 10/06/15 0720   10/03/15 1000  fluconazole (DIFLUCAN) tablet 100 mg  Status:  Discontinued     100 mg Oral Daily 10/02/15 2312 10/10/15 0736     Family Communication: Family at bedside all questions answered satisfactorily.  Disposition:  Barriers to safe discharge: Diarrhea   Intake/Output Summary (Last 24 hours) at 10/15/15 1956 Last data filed at 10/14/15 2300  Gross per 24 hour  Intake 778.75 ml  Output      0 ml  Net 778.75 ml   Filed Weights   10/03/15 1420 10/09/15 2100 10/12/15 0423  Weight: 66.1 kg (145 lb 11.6 oz) 65.4 kg (144 lb 2.9 oz) 79.7 kg (175 lb 11.3 oz)   Objective: Physical Exam: Filed Vitals:   10/15/15 1405 10/15/15 1411 10/15/15 1726 10/15/15 1821  BP:  127/90  122/70  Pulse:  96    Temp:  97.8 F (36.6 C)    TempSrc:  Oral    Resp:  18    Height:      Weight:      SpO2: 91% 98% 98%     General: Appear in moderate distress, NO Rash; Oral Mucosa moist. Cardiovascular: S1 and S2 Present, no Murmur, no JVD Respiratory: Bilateral Air entry present and Clear to Auscultation, no Crackles, no wheezes, later on  bilateral expiratory wheezing with bibasilar crackles Abdomen: Bowel Sound present, Soft and mild tenderness Extremities: no Pedal edema, no calf tenderness  Data Reviewed: CBC:  Recent Labs Lab 10/12/15 0500 10/13/15 0330 10/14/15 0436 10/15/15 0330 10/15/15 1850  WBC 15.2* 17.3* 15.6* 16.0* 12.1*  NEUTROABS  --   --   --   --  10.5*  HGB 9.6* 9.5* 8.9* 9.6* 10.3*  HCT 30.1* 29.3* 27.5* 30.6* 32.7*  MCV 80.7 79.8 80.6 81.2 81.3  PLT 224 227 206 249 [id number]   Basic Metabolic Panel:  Recent Labs Lab 10/11/15 0434  10/12/15 0500  10/13/15 0330 10/13/15 2054 10/14/15 0436 10/15/15 0330 10/15/15 1850  NA 133*  < > 136  < > 136 136 139 137 135  K 5.5*  < > 4.2  < > 3.7 3.8 4.0 4.1 3.9  CL 117*  < > 113*  < > 108 107 111 108 108  CO2 11*  < > 16*  < > 19* 21* 21* 21* 20*  GLUCOSE 71  < > 125*  < > 125* 115* 91 86 89  BUN <5*  < > <5*  < > 5* 6 6 5* 5*  CREATININE 1.05*  < > 0.92  < >  0.98 0.95 0.97 0.87 0.82  CALCIUM 6.8*  < > 6.5*  < > 6.4* 6.6* 6.7* 6.7* 6.7*  MG 1.8  --  1.6*  --  1.6* 2.0 2.1 1.9  --   PHOS 2.5  --  2.5  --  2.3*  --  2.5 2.2*  --   < > = values in this interval not displayed. Liver Function Tests:  Recent Labs Lab 10/11/15 0434 10/12/15 0034 10/13/15 0330 10/14/15 0436 10/15/15 0330 10/15/15 1850  AST 15 15  --   --   --  18  ALT 7* 11*  --   --   --  13*  ALKPHOS 78 147*  --   --   --  348*  BILITOT 0.5 0.7  --   --   --  0.6  PROT 3.4* <3.0*  --   --   --  4.1*  ALBUMIN 1.6* 1.5* 1.5* 2.1* 2.0* 2.3*   No results for input(s): LIPASE, AMYLASE in the last 168 hours. No results for input(s): AMMONIA in the last 168 hours.  Cardiac Enzymes: No results for input(s): CKTOTAL, CKMB, CKMBINDEX, TROPONINI in the last 168 hours.  BNP (last 3 results) No results for input(s): BNP in the last 8760 hours.  CBG:  Recent Labs Lab 10/08/15 2353 10/09/15 0627  GLUCAP 89 106*    Recent Results (from the past 240 hour(s))  C difficile quick  scan w PCR reflex     Status: None   Collection Time: 10/15/15 10:26 AM  Result Value Ref Range Status   C Diff antigen NEGATIVE NEGATIVE Final   C Diff toxin NEGATIVE NEGATIVE Final   C Diff interpretation Negative for toxigenic C. difficile  Final     Studies: Ct Angio Chest Pe W/cm &/or Wo Cm  10/15/2015  CLINICAL DATA:  Shortness of breath. Hypoxia. Stage IV metastatic melanoma. EXAM: CT ANGIOGRAPHY CHEST WITH CONTRAST TECHNIQUE: Multidetector CT imaging of the chest was performed using the standard protocol during bolus administration of intravenous contrast. Multiplanar CT image reconstructions and MIPs were obtained to evaluate the vascular anatomy. CONTRAST:  136mL OMNIPAQUE IOHEXOL 350 MG/ML SOLN COMPARISON:  Portable chest dated 10/11/2015. Abdomen and pelvis CT dated 10/03/2015. Chest CTA dated 07/27/2015. FINDINGS: Mediastinum/Lymph Nodes: Significant increase in size of a large right anterior mediastinal mass. This previously measured 4.3 cm in maximum diameter and currently measures 7.1 cm in maximum corresponding diameter on image number 38. A previously demonstrated 1.5 cm right hilar node continues to measure 1.5 cm in maximum corresponding diameter on image number 41. A previously demonstrated 1.8 cm left hilar node currently measures 1.7 cm in corresponding diameter on image number 42. A previously demonstrated 4.9 cm left axillary node measures 5.8 cm in corresponding diameter on image number 23. A previously demonstrated 1.0 cm short axis right axillary node currently measures 9 mm on image number 33. A 6 mm short axis epicardial lymph node on image number 69 is not changed significantly. The previously seen large right breast mass is not included in its entirety, currently measuring greater than 6.5 cm in maximum diameter on image number 60, previously measuring 5.3 cm in maximum diameter. Interval bilateral subcutaneous edema. Normally opacified pulmonary arteries with no pulmonary  arterial filling defects seen. Lungs/Pleura: Moderate-sized right pleural effusion with mild progression since 10/03/2015 moderate-sized left pleural effusion, significantly increased. Bilateral lower lobe atelectasis. Small pericardial effusion with a maximum thickness of 8 mm. Interval mild patchy opacities in the left upper  lobe and left lower lobe. Minimal similar changes in the right upper lobe and right middle lobe. Mild bilateral bullous changes. Upper abdomen: Huge, heterogeneous bilateral adrenal masses are again demonstrated. The mass on the right measures 11.8 cm in maximum diameter on image number 92 in the mass on the left measures 11.5 cm in maximum diameter on image number 91, both unchanged. Small amount of free peritoneal fluid. Musculoskeletal: No chest wall mass or suspicious bone lesions identified. Review of the MIP images confirms the above findings. IMPRESSION: 1. No pulmonary emboli. 2. Moderate-sized bilateral pleural effusions, significantly increased on the left. 3. Bilateral lower lobe atelectasis. 4. Patchy opacities in both lungs, greater on the left. These most compatible with an infectious or inflammatory process. 5. COPD. 6. Extensive metastatic disease with continued progression, as described above. 7. Small amount of ascites. 8. Small pericardial effusion. Electronically Signed   By: Claudie Revering M.D.   On: 10/15/2015 16:25     Scheduled Meds: . feeding supplement  1 Container Oral TID BM  . feeding supplement (ENSURE ENLIVE)  237 mL Oral BID BM  . heparin subcutaneous  5,000 Units Subcutaneous 3 times per day  . ipratropium  0.5 mg Nebulization Q6H  . levalbuterol  0.63 mg Nebulization Q6H  . levothyroxine  50 mcg Oral QAC breakfast  . methylPREDNISolone (SOLU-MEDROL) injection  40 mg Intravenous Q24H  . morphine  15 mg Oral Q12H  . nystatin  5 mL Oral QID  . ranitidine  150 mg Oral BID   Continuous Infusions:   PRN Meds: acetaminophen **OR** acetaminophen, liver  oil-zinc oxide, morphine, ondansetron (ZOFRAN) IV, sodium chloride flush  Time spent: 30 minutes  Author: Berle Mull, MD [facility] Pager: [phone] 10/15/2015 7:56 PM  If 7PM-7AM, please contact night-coverage at [email or web address], password [email or web address]

## 2015-10-15 NOTE — Progress Notes (Signed)
Patient complaining of shortness of breath.  Oxygen saturation was 91% on room and and patient appeared very anxious.  HR 122 on telemetry.  Placed on 2L oxygen via [place] and patient took deep breaths, SpO2 resolved to 98%.  Patient does not appear to be in any more distress, MD paged to notify.

## 2015-10-15 NOTE — Progress Notes (Signed)
Nutrition Follow-up  DOCUMENTATION CODES:   Non-severe (moderate) malnutrition in context of acute illness/injury  INTERVENTION:  - Continue Boost Breeze TID and Ensure Enlive BID - Encourage PO intakes as tolerated/able - RD will continue to monitor for needs  NUTRITION DIAGNOSIS:   Inadequate oral intake related to acute illness, nausea, vomiting, inability to eat as evidenced by per patient/family report, NPO status. -ongoing despite diet advancemetn  GOAL:   Patient will meet greater than or equal to 90% of their needs -unmet  MONITOR:   PO intake, Supplement acceptance, Diet advancement, Weight trends, Labs, Skin, I & O's  ASSESSMENT:   55 y.o. female with history of metastatic melanoma who was recently admitted 2 weeks ago for diarrhea and colonoscopy showed lymphocytic colitis and is on Entocort present to the ER because of recurrence of diarrhea. Patient states over the last 3 days patient is having multiple episodes of diarrhea denies any nausea vomiting but at this time patient is also having abdominal pain diffusely. Denies any antibiotics intake except for Diflucan for oral candidiasis. Denies any fever chills chest pain shortness of breath. Patient has been admitted for further management of dehydration diarrhea and abdominal pain. Patient is also found to be hypokalemic.   Pt's diet downgraded from Regular to FLD on 10/12/15 with no intakes documented since that time. Pt reports that for breakfast this AM she consumed chicken broth without issue. She states that she has not had lunch and does not plan to consume anything for this meal due to nausea, which has been ongoing. She states she has had vomiting associated with nausea and that last episode was yesterday (2/19) PM. Pt also reports pain to abdomen and back. She states that she has tried [employer] and tolerates this supplement well and that she has tried [employer] in the past and it has caused increase in  diarrhea. Talked with pt about supplements and adjustments to them and she states that she is fine with keeping them both although she prefers to only drink water.  Not meeting needs. Medications reviewed. Labs reviewed; BUN: 5 mg/dL, Ca: 6.7 mg/dL, Phos: 2.2 mg/dL.   Diet Order:  Diet full liquid Room service appropriate?: Yes; Fluid consistency:: Thin  Skin:  Reviewed, no issues  Last BM:  2/19  Height:   Ht Readings from Last 1 Encounters:  10/09/15 5\' 1"  (1.549 m)    Weight:   Wt Readings from Last 1 Encounters:  10/12/15 175 lb 11.3 oz (79.7 kg)    Ideal Body Weight:  47.73 kg (kg)  BMI:  Body mass index is 33.22 kg/(m^2).  Estimated Nutritional Needs:   Kcal:  1920-2115 (30-33 kcal/kg)  Protein:  75-90 grams (1.2-1.4 grams/kg)  Fluid:  >/= 2.1 L/day  EDUCATION NEEDS:   No education needs identified at this time     Jarome Matin, RD, LDN Inpatient Clinical Dietitian Pager # [phone] After hours/weekend pager # [phone]

## 2015-10-16 DIAGNOSIS — R0602 Shortness of breath: Secondary | ICD-10-CM

## 2015-10-16 DIAGNOSIS — J101 Influenza due to other identified influenza virus with other respiratory manifestations: Secondary | ICD-10-CM | POA: Insufficient documentation

## 2015-10-16 DIAGNOSIS — E46 Unspecified protein-calorie malnutrition: Secondary | ICD-10-CM

## 2015-10-16 LAB — RENAL FUNCTION PANEL
Albumin: 2.2 g/dL — ABNORMAL LOW (ref 3.5–5.0)
Anion gap: 6 (ref 5–15)
BUN: <5 mg/dL — ABNORMAL LOW (ref 6–20)
CALCIUM: 7.2 mg/dL — AB (ref 8.9–10.3)
CHLORIDE: 109 mmol/L (ref 101–111)
CO2: 24 mmol/L (ref 22–32)
CREATININE: 0.71 mg/dL (ref 0.44–1.00)
Glucose, Bld: 76 mg/dL (ref 65–99)
Phosphorus: 3 mg/dL (ref 2.5–4.6)
Potassium: 4 mmol/L (ref 3.5–5.1)
SODIUM: 139 mmol/L (ref 135–145)

## 2015-10-16 LAB — CBC
HCT: 31.9 % — ABNORMAL LOW (ref 36.0–46.0)
Hemoglobin: 9.9 g/dL — ABNORMAL LOW (ref 12.0–15.0)
MCH: 25.4 pg — ABNORMAL LOW (ref 26.0–34.0)
MCHC: 31 g/dL (ref 30.0–36.0)
MCV: 81.8 fL (ref 78.0–100.0)
PLATELETS: 236 10*3/uL (ref 150–400)
RBC: 3.9 MIL/uL (ref 3.87–5.11)
RDW: 21.4 % — ABNORMAL HIGH (ref 11.5–15.5)
WBC: 11.3 10*3/uL — AB (ref 4.0–10.5)

## 2015-10-16 LAB — INFLUENZA PANEL BY PCR (TYPE A & B)
H1N1FLUPCR: NOT DETECTED
Influenza A By PCR: POSITIVE — AB
Influenza B By PCR: NEGATIVE

## 2015-10-16 LAB — MAGNESIUM: Magnesium: 1.8 mg/dL (ref 1.7–2.4)

## 2015-10-16 MED ORDER — FUROSEMIDE 10 MG/ML IJ SOLN
60.0000 mg | Freq: Once | INTRAMUSCULAR | Status: AC
  Administered 2015-10-16: 60 mg via INTRAVENOUS
  Filled 2015-10-16: qty 6

## 2015-10-16 MED ORDER — IPRATROPIUM BROMIDE 0.02 % IN SOLN
0.5000 mg | Freq: Three times a day (TID) | RESPIRATORY_TRACT | Status: DC
  Administered 2015-10-17 – 2015-10-18 (×4): 0.5 mg via RESPIRATORY_TRACT
  Filled 2015-10-16 (×5): qty 2.5

## 2015-10-16 MED ORDER — OSELTAMIVIR PHOSPHATE 75 MG PO CAPS
75.0000 mg | ORAL_CAPSULE | Freq: Two times a day (BID) | ORAL | Status: AC
  Administered 2015-10-16 – 2015-10-20 (×10): 75 mg via ORAL
  Filled 2015-10-16 (×10): qty 1

## 2015-10-16 MED ORDER — LEVALBUTEROL HCL 0.63 MG/3ML IN NEBU
0.6300 mg | INHALATION_SOLUTION | Freq: Three times a day (TID) | RESPIRATORY_TRACT | Status: DC
  Administered 2015-10-17 – 2015-10-18 (×4): 0.63 mg via RESPIRATORY_TRACT
  Filled 2015-10-16 (×5): qty 3

## 2015-10-16 MED ORDER — ALBUMIN HUMAN 25 % IV SOLN
50.0000 g | Freq: Once | INTRAVENOUS | Status: AC
  Administered 2015-10-16: 50 g via INTRAVENOUS
  Filled 2015-10-16: qty 200

## 2015-10-16 MED ORDER — ENSURE ENLIVE PO LIQD
237.0000 mL | Freq: Three times a day (TID) | ORAL | Status: DC
  Administered 2015-10-16 – 2015-10-20 (×6): 237 mL via ORAL

## 2015-10-16 NOTE — Progress Notes (Signed)
[facility] Progress Note  Patient: Felicia Richmond [id number]   PCP: Felicia Pear., NP DOB: 1960-12-18   DOA: 10/02/2015   DOS: 10/16/2015   Date of Service: the patient was seen and examined on 10/16/2015  Subjective: Patient did not have any further bowel movement but over the evening yesterday had episodes of shortness of breath her breathing has improved significantly with the therapy. Nutrition: Tolerating diet Activity: Bedridden present Last BM: 10/14/2015  Assessment and Plan: 1. C. difficile diarrhea, sepsis Lymphocytic colitis. Patient has persistent diarrhea with a biopsy-proven lymphocytic colitis, she was on Entocort. This was further worsened with combination of ipilimumab side effect as well as C. difficile infection. She was started on oral vancomycin despite which there was not improvement and therefore she was started on Flagyl. With lactic acidosis, hypotension, tachycardia the patient is meeting criteria for sepsis most likely secondary to C. difficile.  Oncology has started the patient on Remicade, she received first dose 10/10/2015. Appreciate input from all the consultants. Oncology has started the patient on IV steroids. Critical care was consulted and the patient was under their care for 10/12/2015. Patient was started on bicarbonate drip as well as LR bolus. after discussion with the gastroenterology as well as oncology her vancomycin as well as Flagyl has been discontinued. Repeat C. difficile is negative, continue precaution  2. Acute hypoxic respiratory failure Influenza A CT PE negative. We'll check sputum culture. Increase Solu-Medrol 40 mg daily. Put her on duo nebs. Influenza PCR is positive and we will treat her with Tamiflu Holding off on placing the patient on empiric antibiotic given her recent C. Difficile. Patient was given IV Lasix 1 after albumin for volume overload and she did urinated appropriately and may need further Lasix down the  road.  3. Urinary retention. Foley catheter has been removed and after initial episode of retention the patient has been successfully able to urinate on her own.  4. Metastatic melanoma. Management per oncology. Pleural fluid cytology negative, shows mild lymphocytes and reactive mesothelial cells.  5. Nausea vomiting with poor oral intake. Appreciate input from nutritional consult. Continue supplements. Added ENSURE  today  6. Hyperthyroidism TSH and free T4 resulted with significantly high TSH 15.32 and undetectable free T4 level. methimazole has been discontinued and the patient has been started on IV Synthroid.  She has been transitioned to oral Synthroid  7. Oral thrush. Completed one week of fluconazole on nystatin  8. Metabolic acidosis. Patient has received IV bicarbonate infusion in the step down. Currently resolved  DVT Prophylaxis: subcutaneous Heparin Nutrition: Regular diet Advance goals of care discussion: Full code at present, will discuss with oncology regarding further input  Brief Summary of Hospitalization:  HPI: As per the H and P dictated on admission, "Felicia Richmond is a 55 y.o. female with history of metastatic melanoma who was recently admitted 2 weeks ago for diarrhea and colonoscopy showed lymphocytic colitis and is on Entocort present to the ER because of recurrence of diarrhea. Patient states over the last 3 days patient is having multiple episodes of diarrhea denies any nausea vomiting but at this time patient is also having abdominal pain diffusely. Denies any antibiotics intake except for Diflucan for oral candidiasis. Denies any fever chills chest pain shortness of breath. Patient has been admitted for further management of dehydration diarrhea and abdominal pain. Patient is also found to be hypokalemic. " Daily update, Procedures: None Consultants: Gastroenterology, medical oncology, critical care Antibiotics: Anti-infectives    Start  Dose/Rate  Route Frequency Ordered Stop   10/16/15 1000  oseltamivir (TAMIFLU) capsule 75 mg     75 mg Oral 2 times daily 10/16/15 0734 10/21/15 0959   10/06/15 0800  vancomycin (VANCOCIN) 50 mg/mL oral solution 500 mg  Status:  Discontinued     500 mg Oral 4 times per day 10/06/15 0720 10/14/15 1334   10/06/15 0800  metroNIDAZOLE (FLAGYL) IVPB 500 mg  Status:  Discontinued     500 mg 100 mL/hr over 60 Minutes Intravenous Every 8 hours 10/06/15 0720 10/15/15 0804   10/03/15 1800  vancomycin (VANCOCIN) 50 mg/mL oral solution 125 mg  Status:  Discontinued     125 mg Oral 4 times daily 10/03/15 1558 10/06/15 0720   10/03/15 1000  fluconazole (DIFLUCAN) tablet 100 mg  Status:  Discontinued     100 mg Oral Daily 10/02/15 2312 10/10/15 0736     Family Communication: Family at bedside all questions answered satisfactorily.  Disposition:  Barriers to safe discharge: Respiratory status   Intake/Output Summary (Last 24 hours) at 10/16/15 1936 Last data filed at 10/16/15 1330  Gross per 24 hour  Intake    200 ml  Output   1300 ml  Net  -1100 ml   Filed Weights   10/03/15 1420 10/09/15 2100 10/12/15 0423  Weight: 66.1 kg (145 lb 11.6 oz) 65.4 kg (144 lb 2.9 oz) 79.7 kg (175 lb 11.3 oz)   Objective: Physical Exam: Filed Vitals:   10/16/15 0640 10/16/15 1015 10/16/15 1608 10/16/15 1919  BP: 117/72 123/76 116/66   Pulse: 108 110 116   Temp: 97.3 F (36.3 C) 97.5 F (36.4 C) 98.2 F (36.8 C)   TempSrc: Oral Axillary Axillary   Resp: 18 18 18    Height:      Weight:      SpO2: 97% 93% 92% 93%    General: Appear in moderate distress, NO Rash; Oral Mucosa moist. Cardiovascular: S1 and S2 Present, no Murmur, no JVD Respiratory: Bilateral Air entry present and basal Crackles, occasional wheezes, Abdomen: Bowel Sound present, Soft and mild tenderness Extremities: Bilateral Pedal edema, no calf tenderness  Data Reviewed: CBC:  Recent Labs Lab 10/13/15 0330 10/14/15 0436 10/15/15 0330  10/15/15 1850 10/16/15 0440  WBC 17.3* 15.6* 16.0* 12.1* 11.3*  NEUTROABS  --   --   --  10.5*  --   HGB 9.5* 8.9* 9.6* 10.3* 9.9*  HCT 29.3* 27.5* 30.6* 32.7* 31.9*  MCV 79.8 80.6 81.2 81.3 81.8  PLT 227 206 249 275 [id number]   Basic Metabolic Panel:  Recent Labs Lab 10/12/15 0500  10/13/15 0330 10/13/15 2054 10/14/15 0436 10/15/15 0330 10/15/15 1850 10/16/15 0440  NA 136  < > 136 136 139 137 135 139  K 4.2  < > 3.7 3.8 4.0 4.1 3.9 4.0  CL 113*  < > 108 107 111 108 108 109  CO2 16*  < > 19* 21* 21* 21* 20* 24  GLUCOSE 125*  < > 125* 115* 91 86 89 76  BUN <5*  < > 5* 6 6 5* 5* <5*  CREATININE 0.92  < > 0.98 0.95 0.97 0.87 0.82 0.71  CALCIUM 6.5*  < > 6.4* 6.6* 6.7* 6.7* 6.7* 7.2*  MG 1.6*  --  1.6* 2.0 2.1 1.9  --  1.8  PHOS 2.5  --  2.3*  --  2.5 2.2*  --  3.0  < > = values in this interval not displayed. Liver Function Tests:  Recent  Labs Lab 10/11/15 0434 10/12/15 0034 10/13/15 0330 10/14/15 0436 10/15/15 0330 10/15/15 1850 10/16/15 0440  AST 15 15  --   --   --  18  --   ALT 7* 11*  --   --   --  13*  --   ALKPHOS 78 147*  --   --   --  348*  --   BILITOT 0.5 0.7  --   --   --  0.6  --   PROT 3.4* <3.0*  --   --   --  4.1*  --   ALBUMIN 1.6* 1.5* 1.5* 2.1* 2.0* 2.3* 2.2*   No results for input(s): LIPASE, AMYLASE in the last 168 hours. No results for input(s): AMMONIA in the last 168 hours.  Cardiac Enzymes: No results for input(s): CKTOTAL, CKMB, CKMBINDEX, TROPONINI in the last 168 hours.  BNP (last 3 results) No results for input(s): BNP in the last 8760 hours.  CBG: No results for input(s): GLUCAP in the last 168 hours.  Recent Results (from the past 240 hour(s))  C difficile quick scan w PCR reflex     Status: None   Collection Time: 10/15/15 10:26 AM  Result Value Ref Range Status   C Diff antigen NEGATIVE NEGATIVE Final   C Diff toxin NEGATIVE NEGATIVE Final   C Diff interpretation Negative for toxigenic C. difficile  Final     Studies: No  results found.   Scheduled Meds: . feeding supplement  1 Container Oral TID BM  . feeding supplement (ENSURE ENLIVE)  237 mL Oral TID BM  . heparin subcutaneous  5,000 Units Subcutaneous 3 times per day  . levothyroxine  50 mcg Oral QAC breakfast  . methylPREDNISolone (SOLU-MEDROL) injection  40 mg Intravenous Q24H  . morphine  15 mg Oral Q12H  . nystatin  5 mL Oral QID  . oseltamivir  75 mg Oral BID  . ranitidine  150 mg Oral BID   Continuous Infusions:   PRN Meds: acetaminophen **OR** acetaminophen, ipratropium, levalbuterol, liver oil-zinc oxide, morphine, ondansetron (ZOFRAN) IV, sodium chloride flush  Time spent: 30 minutes  Author: Berle Mull, MD [facility] Pager: [phone] 10/16/2015 7:36 PM  If 7PM-7AM, please contact night-coverage at [email or web address], password [facility]

## 2015-10-16 NOTE — Progress Notes (Signed)
Felicia Richmond was having some SOB last PM. She did have a CT of the chest done. It does show she has increased in adenopathy. Is possible that the adenopathy might be causing her shortness of breath.   she does have some emphysematous changes I think.  It sounds like the diarrhea is doing better. I do have her on some steroids. I think that she probably has some degree of adrenal insufficiency.  I also think there is some hypothyroidism.  Physical therapy and try to work with her.  She has a lot of edema in her legs. Some of this might be from her be malnourished. She has some low albumin levels. Her hemoglobin is all right. She might benefit from some albumin to try to help mobilize some of the fluid in her legs.  Her labs overall look pretty decent. Her BUN is less than 5 and creatinine is 0.71. Chloride is 4. Her albumin is 2.2.  Her platelet count is 236,000. Her hemoglobin is 9.9. Her white cell count is 11.3  She's been afebrile. Her heart rate is up a little bit.  Since admission, she is (+) 15 L of fluid. I think this probably is a big part of the edema and also protruding to the shortness of breath. If she can just mobilize this fluid, this will I think help her performance status.  I think one of the problems that we're going to have is her melanoma continued to progress. We just are not able to treat her given her overall performance status and her being in the hospital.  I appreciate all the great care that she is getting. Hopefully, the edema will improve.  Lum Keas  Hebrews 10:36

## 2015-10-17 DIAGNOSIS — R062 Wheezing: Secondary | ICD-10-CM

## 2015-10-17 DIAGNOSIS — J9601 Acute respiratory failure with hypoxia: Secondary | ICD-10-CM

## 2015-10-17 DIAGNOSIS — A419 Sepsis, unspecified organism: Secondary | ICD-10-CM

## 2015-10-17 DIAGNOSIS — K52832 Lymphocytic colitis: Secondary | ICD-10-CM

## 2015-10-17 DIAGNOSIS — D649 Anemia, unspecified: Secondary | ICD-10-CM

## 2015-10-17 LAB — RENAL FUNCTION PANEL
ANION GAP: 7 (ref 5–15)
Albumin: 3 g/dL — ABNORMAL LOW (ref 3.5–5.0)
BUN: <5 mg/dL — ABNORMAL LOW (ref 6–20)
CALCIUM: 7.2 mg/dL — AB (ref 8.9–10.3)
CO2: 28 mmol/L (ref 22–32)
CREATININE: 0.64 mg/dL (ref 0.44–1.00)
Chloride: 103 mmol/L (ref 101–111)
Glucose, Bld: 69 mg/dL (ref 65–99)
Phosphorus: 3.2 mg/dL (ref 2.5–4.6)
Potassium: 3.7 mmol/L (ref 3.5–5.1)
SODIUM: 138 mmol/L (ref 135–145)

## 2015-10-17 LAB — CBC
HCT: 27.3 % — ABNORMAL LOW (ref 36.0–46.0)
HEMOGLOBIN: 8.5 g/dL — AB (ref 12.0–15.0)
MCH: 25.9 pg — AB (ref 26.0–34.0)
MCHC: 31.1 g/dL (ref 30.0–36.0)
MCV: 83.2 fL (ref 78.0–100.0)
PLATELETS: 205 10*3/uL (ref 150–400)
RBC: 3.28 MIL/uL — AB (ref 3.87–5.11)
RDW: 21.8 % — ABNORMAL HIGH (ref 11.5–15.5)
WBC: 11.7 10*3/uL — AB (ref 4.0–10.5)

## 2015-10-17 LAB — MAGNESIUM: Magnesium: 1.7 mg/dL (ref 1.7–2.4)

## 2015-10-17 LAB — PREPARE RBC (CROSSMATCH)

## 2015-10-17 LAB — PREALBUMIN: Prealbumin: 9.9 mg/dL — ABNORMAL LOW (ref 18–38)

## 2015-10-17 LAB — ABO/RH: ABO/RH(D): A NEG

## 2015-10-17 MED ORDER — FUROSEMIDE 10 MG/ML IJ SOLN
40.0000 mg | Freq: Once | INTRAMUSCULAR | Status: AC
  Administered 2015-10-17: 40 mg via INTRAVENOUS
  Filled 2015-10-17: qty 4

## 2015-10-17 MED ORDER — SODIUM CHLORIDE 0.9 % IV SOLN
Freq: Once | INTRAVENOUS | Status: AC
  Administered 2015-10-17: 11:00:00 via INTRAVENOUS

## 2015-10-17 MED ORDER — FUROSEMIDE 10 MG/ML IJ SOLN
20.0000 mg | Freq: Once | INTRAMUSCULAR | Status: AC
  Administered 2015-10-17: 20 mg via INTRAVENOUS
  Filled 2015-10-17: qty 2

## 2015-10-17 MED ORDER — MAGNESIUM SULFATE 4 GM/100ML IV SOLN
4.0000 g | Freq: Once | INTRAVENOUS | Status: AC
  Administered 2015-10-17: 4 g via INTRAVENOUS
  Filled 2015-10-17: qty 100

## 2015-10-17 NOTE — Progress Notes (Signed)
[facility] PROGRESS NOTE  Jesmarie Bettger [id number] DOB: [birth year] DOA: 10/02/2015 PCP: Nance Pear., NP  Assessment/Plan: #1 C. difficile colitis/sepsis/lymphocytic colitis Patient with persistent diarrhea with biopsy-proven lymphocytic colitis. Patient was on Entocort. Diarrhea worsened with combination of ipilimumab side effect as well as a C differential colitis. Patient was started on oral vancomycin the slide which still was not improvement and subsequently started on Flagyl. Patient noted to be the criteria for sepsis secondary to lactic acidosis, hypotension, tachycardia. Oncology is started patient on Remicade first dose 10/10/2015. Patient currently on IV steroids. Patient was seen in consultation by critical care and was on the dedicated 10/12/2015 were patient was not on a bicarbonate drip as well as LR bolus. Dr. Posey Pronto discuss with oncology and gastroenterology and Flagyl has been discontinued. Repeat C. difficile was negative. Continue contact precautions. Supportive care.  #2 acute hypoxic respiratory failure/influenza A CT chest negative for PE. Sputum Gram stain and culture pending. Continue IV Solu-Medrol 40 mg daily. Continue duo nebs. Will give a dose of Lasix 40 mg IV 1 as patient is receiving blood transfusions. Continue Tamiflu. Follow.  #3 urinary retention Patient was able to void after Foley catheter was discontinued.  #4 metastatic melanoma Pleural fluid cytology negative. Per oncology. Patient with metastatic disease and likely with a poor prognosis. Will consulted palliative care for goals of care.  #5 failure to thrive No further nausea or vomiting. Some improvement with oral intake. Continue nutritional supplementation.  #6 hypothyroidism/Hx hyperthroidism Patient noted to have a significantly elevated TSH of 15.32 and undetectable free T4 levels. Methimazole has been discontinued. Continue current dose Synthroid. Will likely need outpatient  follow-up.  #7 oral thrush Status post 1 week fluconazole. Nystatin.  #8 metabolic acidosis Resolved.   Code Status: DO NOT RESUSCITATE Family Communication: Updated patient. No family present. Disposition Plan: Remain inpatient.   Consultants:  Oncology: Dr.Ennever 10/03/2015  Gastroenterology: Dr. Amedeo Plenty 10/08/2015  PCCM: Dr. Ashok Cordia 10/11/2015  Procedures:  2 units packed red blood cells 10/17/2015  CT angiogram chest 10/15/2015  CT abdomen and pelvis 10/03/2015  Chest x-ray 10/05/2015, 10/11/2015  Abdominal x-ray 10/11/2015, 10/12/2015  Thoracentesis 10/05/2015  Antibiotics:  IV vancomycin 10/03/2015>>>> 10/14/2015  HPI/Subjective: Patient states not feeling any better. Patient with complaints of shortness of breath. Patient denies any chest pain.  Objective: Filed Vitals:   10/17/15 1436 10/17/15 1453  BP: 109/63 111/61  Pulse: 101 101  Temp: 97.6 F (36.4 C) 97.5 F (36.4 C)  Resp:      Intake/Output Summary (Last 24 hours) at 10/17/15 1510 Last data filed at 10/17/15 1406  Gross per 24 hour  Intake    554 ml  Output    500 ml  Net     54 ml   Filed Weights   10/03/15 1420 10/09/15 2100 10/12/15 0423  Weight: 66.1 kg (145 lb 11.6 oz) 65.4 kg (144 lb 2.9 oz) 79.7 kg (175 lb 11.3 oz)    Exam:   General:  NAD  Cardiovascular: RRR  Respiratory: CTAB anterior lung fields.  Abdomen: Soft, nontender, nondistended, positive bowel sounds.  Musculoskeletal: No clubbing or cyanosis. 2-3+ bilateral lower extremity edema to hips.  Data Reviewed: Basic Metabolic Panel:  Recent Labs Lab 10/13/15 0330 10/13/15 2054 10/14/15 0436 10/15/15 0330 10/15/15 1850 10/16/15 0440 10/17/15 0500  NA 136 136 139 137 135 139 138  K 3.7 3.8 4.0 4.1 3.9 4.0 3.7  CL 108 107 111 108 108 109 103  CO2 19* 21* 21* 21* 20*  24 28  GLUCOSE 125* 115* 91 86 89 76 69  BUN 5* 6 6 5* 5* <5* <5*  CREATININE 0.98 0.95 0.97 0.87 0.82 0.71 0.64  CALCIUM 6.4* 6.6*  6.7* 6.7* 6.7* 7.2* 7.2*  MG 1.6* 2.0 2.1 1.9  --  1.8 1.7  PHOS 2.3*  --  2.5 2.2*  --  3.0 3.2   Liver Function Tests:  Recent Labs Lab 10/11/15 0434 10/12/15 0034  10/14/15 0436 10/15/15 0330 10/15/15 1850 10/16/15 0440 10/17/15 0500  AST 15 15  --   --   --  18  --   --   ALT 7* 11*  --   --   --  13*  --   --   ALKPHOS 78 147*  --   --   --  348*  --   --   BILITOT 0.5 0.7  --   --   --  0.6  --   --   PROT 3.4* <3.0*  --   --   --  4.1*  --   --   ALBUMIN 1.6* 1.5*  < > 2.1* 2.0* 2.3* 2.2* 3.0*  < > = values in this interval not displayed. No results for input(s): LIPASE, AMYLASE in the last 168 hours. No results for input(s): AMMONIA in the last 168 hours. CBC:  Recent Labs Lab 10/14/15 0436 10/15/15 0330 10/15/15 1850 10/16/15 0440 10/17/15 0500  WBC 15.6* 16.0* 12.1* 11.3* 11.7*  NEUTROABS  --   --  10.5*  --   --   HGB 8.9* 9.6* 10.3* 9.9* 8.5*  HCT 27.5* 30.6* 32.7* 31.9* 27.3*  MCV 80.6 81.2 81.3 81.8 83.2  PLT 206 249 275 236 205   Cardiac Enzymes: No results for input(s): CKTOTAL, CKMB, CKMBINDEX, TROPONINI in the last 168 hours. BNP (last 3 results) No results for input(s): BNP in the last 8760 hours.  ProBNP (last 3 results) No results for input(s): PROBNP in the last 8760 hours.  CBG: No results for input(s): GLUCAP in the last 168 hours.  Recent Results (from the past 240 hour(s))  C difficile quick scan w PCR reflex     Status: None   Collection Time: 10/15/15 10:26 AM  Result Value Ref Range Status   C Diff antigen NEGATIVE NEGATIVE Final   C Diff toxin NEGATIVE NEGATIVE Final   C Diff interpretation Negative for toxigenic C. difficile  Final     Studies: Ct Angio Chest Pe W/cm &/or Wo Cm  10/15/2015  CLINICAL DATA:  Shortness of breath. Hypoxia. Stage IV metastatic melanoma. EXAM: CT ANGIOGRAPHY CHEST WITH CONTRAST TECHNIQUE: Multidetector CT imaging of the chest was performed using the standard protocol during bolus administration of  intravenous contrast. Multiplanar CT image reconstructions and MIPs were obtained to evaluate the vascular anatomy. CONTRAST:  189mL OMNIPAQUE IOHEXOL 350 MG/ML SOLN COMPARISON:  Portable chest dated 10/11/2015. Abdomen and pelvis CT dated 10/03/2015. Chest CTA dated 07/27/2015. FINDINGS: Mediastinum/Lymph Nodes: Significant increase in size of a large right anterior mediastinal mass. This previously measured 4.3 cm in maximum diameter and currently measures 7.1 cm in maximum corresponding diameter on image number 38. A previously demonstrated 1.5 cm right hilar node continues to measure 1.5 cm in maximum corresponding diameter on image number 41. A previously demonstrated 1.8 cm left hilar node currently measures 1.7 cm in corresponding diameter on image number 42. A previously demonstrated 4.9 cm left axillary node measures 5.8 cm in corresponding diameter on image number 23. A  previously demonstrated 1.0 cm short axis right axillary node currently measures 9 mm on image number 33. A 6 mm short axis epicardial lymph node on image number 69 is not changed significantly. The previously seen large right breast mass is not included in its entirety, currently measuring greater than 6.5 cm in maximum diameter on image number 60, previously measuring 5.3 cm in maximum diameter. Interval bilateral subcutaneous edema. Normally opacified pulmonary arteries with no pulmonary arterial filling defects seen. Lungs/Pleura: Moderate-sized right pleural effusion with mild progression since 10/03/2015 moderate-sized left pleural effusion, significantly increased. Bilateral lower lobe atelectasis. Small pericardial effusion with a maximum thickness of 8 mm. Interval mild patchy opacities in the left upper lobe and left lower lobe. Minimal similar changes in the right upper lobe and right middle lobe. Mild bilateral bullous changes. Upper abdomen: Huge, heterogeneous bilateral adrenal masses are again demonstrated. The mass on the  right measures 11.8 cm in maximum diameter on image number 92 in the mass on the left measures 11.5 cm in maximum diameter on image number 91, both unchanged. Small amount of free peritoneal fluid. Musculoskeletal: No chest wall mass or suspicious bone lesions identified. Review of the MIP images confirms the above findings. IMPRESSION: 1. No pulmonary emboli. 2. Moderate-sized bilateral pleural effusions, significantly increased on the left. 3. Bilateral lower lobe atelectasis. 4. Patchy opacities in both lungs, greater on the left. These most compatible with an infectious or inflammatory process. 5. COPD. 6. Extensive metastatic disease with continued progression, as described above. 7. Small amount of ascites. 8. Small pericardial effusion. Electronically Signed   By: Claudie Revering M.D.   On: 10/15/2015 16:25    Scheduled Meds: . sodium chloride   Intravenous Once  . feeding supplement  1 Container Oral TID BM  . feeding supplement (ENSURE ENLIVE)  237 mL Oral TID BM  . furosemide  20 mg Intravenous Once  . heparin subcutaneous  5,000 Units Subcutaneous 3 times per day  . ipratropium  0.5 mg Nebulization TID  . levalbuterol  0.63 mg Nebulization TID  . levothyroxine  50 mcg Oral QAC breakfast  . methylPREDNISolone (SOLU-MEDROL) injection  40 mg Intravenous Q24H  . morphine  15 mg Oral Q12H  . nystatin  5 mL Oral QID  . oseltamivir  75 mg Oral BID  . ranitidine  150 mg Oral BID   Continuous Infusions:   Principal Problem:   C. difficile diarrhea Active Problems:   Hyperthyroidism   Metastatic melanoma (HCC)   Diarrhea   Hypokalemia   Abdominal pain   Malnutrition of moderate degree   Hypotension   Lymphocytic colitis   Lactic acidosis   Metabolic acidosis   Sinus tachycardia (HCC)   Sepsis (HCC)   C. difficile colitis   Acute encephalopathy   Acute respiratory failure with hypoxia (HCC)   Influenza A    Time spent: 8 minutes    THOMPSON,DANIEL M.D. [facility] Pager [phone]. If 7PM-7AM, please contact night-coverage at [email or web address], password [facility] 10/17/2015, 3:10 PM  LOS: 15 days

## 2015-10-17 NOTE — Progress Notes (Signed)
PT Cancellation Note  Patient Details Name: Felicia Richmond MRN: [id number] DOB: 03-26-1961   Cancelled Treatment:    Reason Eval/Treat Not Completed:  Attempted PT tx session-nursing in with pt and reports just started pt 's blood transfusion. Will attempt to check back if schedule permits otherwise will see another day. Thanks.    Weston Anna, MPT Pager: [phone]

## 2015-10-17 NOTE — Progress Notes (Signed)
CSW received referral. Assessment to follow tomorrow.    Pete Pelt, [facility] [phone]

## 2015-10-17 NOTE — Progress Notes (Signed)
Pt. C/o having difficulty breathing. Pt. On O2 at 2L sating 87%. O2 increased to 3L without any increase in sating. Pt. Then place back to 4L O2 sats went up to 94%. Respiratory was notified to administer nebs as ordered.

## 2015-10-17 NOTE — Progress Notes (Signed)
Felicia Richmond is having a little bit of a tough time. She's having some breathing issues. I have to suspect that the breathing issues are probably from the melanoma that is involving her lymph nodes. She has significant lymphadenopathy in the mediastinum. This probably is pressing on her airways.  She also has a hemoglobin of 8.5. I really think that a transfusion will help her. At least transfusion can help ameliorate the anemia aspect of any shortness of breath. I talked her about this. Her about transfusions. I don't think she's been transfused before. I told her that the risk of hepatitis or HIV is minimal. I talked her about the possibility of a reaction. Again this would be highly unlikely. She is on steroids right now so this would help with any reaction.  I did talk to her about the fact that our treatment options are minimal. She is still inpatient. I cannot get any of the oral therapies for her. Chemotherapy I think would have about a 10% chance of helping her. Given what she just has been through, I am not sure she would really tolerate chemotherapy.  We did discuss end-of-life issues. I did bring up hospice with her. I think hospice is going to be a valuable resource when she gets out of the hospital.  I did talk to her about her desire to be kept alive on machines. I explained to her that it somewhere to happen that she would have to be put on a lot of support machine given her current status. I explained what this would mean. I think that if she were to go on life support, she would never come off as her body is just too weak for this. She does not want to be kept alive on machines. As such, I would change her status to DO NOT RESUSCITATE. I think that this is very reasonable.  She still does not have much of an appetite. She is not eating that much. His heart is safe she is worried about getting sick and throwing up.  She says she had one episode of diarrhea yesterday. Her C. difficile titer  is negative.  Her labs look okay otherwise. Her albumin is 3.0. I will check a prealbumin on her.  On her physical exam, her vital signs are all stable. Her temperature is 97.4. Pulse is 100. Blood pressure 111/68. Her lungs show some wheezing. She has decent air movement. Cardiac exam is slightly tachycardic but regular. Abdomen is soft. Bowel sounds are present. There is no guarding or rebound tenderness. Extremities shows decreased swelling in the arms. She still has some swelling in the feet.  I really feel bad for Felicia Richmond. She is trying her best. It just seems like we keep running into one problem after another.  Maybe the blood transfusion will help a little bit.  We will see what her prealbumin is.  I probably spent about 35-40 minutes with her this morning.  Pete E.  1 Thessalonians 5:16-18

## 2015-10-18 ENCOUNTER — Ambulatory Visit: Payer: BLUE CROSS/BLUE SHIELD | Admitting: Family

## 2015-10-18 ENCOUNTER — Other Ambulatory Visit: Payer: BLUE CROSS/BLUE SHIELD

## 2015-10-18 ENCOUNTER — Other Ambulatory Visit: Payer: Self-pay | Admitting: *Deleted

## 2015-10-18 ENCOUNTER — Ambulatory Visit: Payer: BLUE CROSS/BLUE SHIELD

## 2015-10-18 ENCOUNTER — Inpatient Hospital Stay (HOSPITAL_COMMUNITY): Payer: BLUE CROSS/BLUE SHIELD

## 2015-10-18 ENCOUNTER — Other Ambulatory Visit: Payer: Self-pay | Admitting: Hematology & Oncology

## 2015-10-18 DIAGNOSIS — C439 Malignant melanoma of skin, unspecified: Secondary | ICD-10-CM

## 2015-10-18 DIAGNOSIS — N61 Mastitis without abscess: Secondary | ICD-10-CM

## 2015-10-18 DIAGNOSIS — C799 Secondary malignant neoplasm of unspecified site: Secondary | ICD-10-CM

## 2015-10-18 DIAGNOSIS — J101 Influenza due to other identified influenza virus with other respiratory manifestations: Secondary | ICD-10-CM

## 2015-10-18 LAB — TYPE AND SCREEN
ABO/RH(D): A NEG
Antibody Screen: NEGATIVE
UNIT DIVISION: 0
Unit division: 0

## 2015-10-18 LAB — CBC
HCT: 36.8 % (ref 36.0–46.0)
Hemoglobin: 11.8 g/dL — ABNORMAL LOW (ref 12.0–15.0)
MCH: 26.8 pg (ref 26.0–34.0)
MCHC: 32.1 g/dL (ref 30.0–36.0)
MCV: 83.6 fL (ref 78.0–100.0)
Platelets: 179 10*3/uL (ref 150–400)
RBC: 4.4 MIL/uL (ref 3.87–5.11)
RDW: 19.6 % — AB (ref 11.5–15.5)
WBC: 10.6 10*3/uL — ABNORMAL HIGH (ref 4.0–10.5)

## 2015-10-18 LAB — RENAL FUNCTION PANEL
ALBUMIN: 2.6 g/dL — AB (ref 3.5–5.0)
Anion gap: 9 (ref 5–15)
CHLORIDE: 97 mmol/L — AB (ref 101–111)
CO2: 31 mmol/L (ref 22–32)
CREATININE: 0.48 mg/dL (ref 0.44–1.00)
Calcium: 7 mg/dL — ABNORMAL LOW (ref 8.9–10.3)
GFR calc Af Amer: >60 mL/min (ref >60)
GFR calc non Af Amer: >60 mL/min (ref >60)
Glucose, Bld: 62 mg/dL — ABNORMAL LOW (ref 65–99)
Phosphorus: 2.1 mg/dL — ABNORMAL LOW (ref 2.5–4.6)
Potassium: 3 mmol/L — ABNORMAL LOW (ref 3.5–5.1)
SODIUM: 137 mmol/L (ref 135–145)

## 2015-10-18 LAB — MAGNESIUM: MAGNESIUM: 2 mg/dL (ref 1.7–2.4)

## 2015-10-18 MED ORDER — DRONABINOL 2.5 MG PO CAPS
2.5000 mg | ORAL_CAPSULE | Freq: Two times a day (BID) | ORAL | Status: DC
  Administered 2015-10-18 – 2015-10-22 (×8): 2.5 mg via ORAL
  Filled 2015-10-18 (×8): qty 1

## 2015-10-18 MED ORDER — LORAZEPAM 0.5 MG PO TABS
0.5000 mg | ORAL_TABLET | Freq: Four times a day (QID) | ORAL | Status: DC | PRN
  Administered 2015-10-18: 0.5 mg via ORAL
  Filled 2015-10-18: qty 1

## 2015-10-18 MED ORDER — POTASSIUM CHLORIDE CRYS ER 20 MEQ PO TBCR
40.0000 meq | EXTENDED_RELEASE_TABLET | ORAL | Status: AC
  Administered 2015-10-18 (×2): 40 meq via ORAL
  Filled 2015-10-18 (×2): qty 2

## 2015-10-18 MED ORDER — FUROSEMIDE 10 MG/ML IJ SOLN
40.0000 mg | Freq: Once | INTRAMUSCULAR | Status: AC
  Administered 2015-10-18: 40 mg via INTRAVENOUS
  Filled 2015-10-18: qty 4

## 2015-10-18 MED ORDER — LOPERAMIDE HCL 2 MG PO CAPS: 4.0000 mg | ORAL_CAPSULE | ORAL | Status: DC | PRN

## 2015-10-18 MED ORDER — TRAMETINIB DIMETHYL SULFOXIDE 2 MG PO TABS: 2.0000 mg | ORAL_TABLET | Freq: Every day | ORAL | Status: DC

## 2015-10-18 MED ORDER — POTASSIUM CHLORIDE 10 MEQ/50ML IV SOLN
10.0000 meq | INTRAVENOUS | Status: AC
  Administered 2015-10-18 (×5): 10 meq via INTRAVENOUS
  Filled 2015-10-18 (×4): qty 50

## 2015-10-18 MED ORDER — DABRAFENIB MESYLATE 75 MG PO CAPS: 150.0000 mg | ORAL_CAPSULE | Freq: Two times a day (BID) | ORAL | Status: DC

## 2015-10-18 MED ORDER — MECLIZINE HCL 25 MG PO TABS
25.0000 mg | ORAL_TABLET | Freq: Every day | ORAL | Status: DC | PRN
  Filled 2015-10-18: qty 1

## 2015-10-18 MED ORDER — PREDNISONE 20 MG PO TABS
40.0000 mg | ORAL_TABLET | Freq: Every day | ORAL | Status: AC
  Administered 2015-10-18 – 2015-10-20 (×3): 40 mg via ORAL
  Filled 2015-10-18 (×3): qty 2

## 2015-10-18 NOTE — Progress Notes (Signed)
Mrs. Molner looks better. She is not as short of breath. She had a very good response to the blood transfusion. She probably had a lot of diuresis. Her arms look back to normal. There is still some swelling in her legs. I think a lot of this is from malnutrition.  Her prealbumin was 9.9. This actually is little better than I had thought.  It would be nice to get another chest x-ray on her to see how that looks.  We talked quite a bit about will happen when she gets out of the hospital. I just would hate to not be oh to treat her again. I think given that she does look better, we might go to utilize oral therapy.  I think that her to skilled nursing and appendectomy about idea. Maybe she can increase her functional status at a skilled nursing facility. She still wants to try. I understand this.  Her labs show a potassium of 3.0. I'm sure this is from all the diuretics that she is getting. I'll go ahead and give her some potassium.  Her hemoglobin is now up to 11.8. She responded very well to the transfusion.  Vital signs all look stable. Pulse is 96.  Her lungs still show some wheezing. She does have decent air movement bilaterally. Cardiac exam regular rate and rhythm. No murmurs are noted. Abdomen is soft. She has no fluid wave. There is no palpable liver or spleen tip. Extremities shows the 2+ edema in her legs. I do not see any edema in her arms.  Again, she seems to be doing a little better. As such, we might want to consider oral therapy. I would see about Tafinlar/Mekinist combination. This might be worthwhile. 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 She is amenable to this. I don't think we get this while she is in the hospital.  Again, I don't think she can be at home by herself right now. Her husband has to work. A skilled nursing facility may not be a bad idea.  I appreciate all the great care that she is getting from everybody up on 5 E.  Lum Keas  [place] 14:14

## 2015-10-18 NOTE — Evaluation (Signed)
Occupational Therapy Evaluation Patient Details Name: Danasia Baker MRN: [id number] DOB: 1960/08/27 Today's Date: 10/18/2015    History of Present Illness 55 yo female admitted with C diff diarrhea. hx of cancer (met melanoma), emphysema, Meniere's disease, HTN, CVA,    Clinical Impression   Pt admitted with flu/ Cdiff . Pt with functional limitations due to the deficits listed below (see OT Problem List).  Pt will benefit from skilled OT to increase their safety and independence with ADL and functional mobility for ADL to facilitate discharge to venue listed below.      Follow Up Recommendations  SNF    Equipment Recommendations  None recommended by OT    Recommendations for Other Services       Precautions / Restrictions Precautions Precautions: Fall      Mobility Bed Mobility Overal bed mobility: Needs Assistance Bed Mobility: Supine to Sit     Supine to sit: HOB elevated;Mod assist        Transfers                 General transfer comment: pt declined standing    Balance Overall balance assessment: Needs assistance Sitting-balance support: Single extremity supported Sitting balance-Leahy Scale: Fair                                      ADL Overall ADL's : Needs assistance/impaired                             Toileting- Clothing Manipulation and Hygiene: Maximal assistance;Bed level;Total assistance         General ADL Comments: Pt agreed to sit EOB with OT. Pt sat with min A for aprox 10 min while daughter lotioned her back.  Pt declined standing     Vision     Perception     Praxis      Pertinent Vitals/Pain Pain Score: 4  Pain Location: back in sitting Pain Descriptors / Indicators: Sore Pain Intervention(s): Limited activity within patient's tolerance;Repositioned     Hand Dominance     Extremity/Trunk Assessment Upper Extremity Assessment Upper Extremity Assessment: Generalized weakness           Communication Communication Communication: No difficulties   Cognition Arousal/Alertness: Awake/alert Behavior During Therapy: WFL for tasks assessed/performed Overall Cognitive Status: Within Functional Limits for tasks assessed                     General Comments       Exercises       Shoulder Instructions      Home Living Family/patient expects to be discharged to:: Skilled nursing facility                                        Prior Functioning/Environment Level of Independence: Needs assistance    ADL's / Homemaking Assistance Needed: husband assisted with bathing, dressing        OT Diagnosis: Generalized weakness;Acute pain   OT Problem List: Decreased strength;Decreased activity tolerance;Impaired balance (sitting and/or standing)   OT Treatment/Interventions: Self-care/ADL training;Patient/family education;DME and/or AE instruction;Therapeutic exercise    OT Goals(Current goals can be found in the care plan section) Acute Rehab OT Goals Patient Stated Goal: less pain. get better OT Goal Formulation:  With patient Time For Goal Achievement: 11/01/15  OT Frequency: Min 2X/week              End of Session Nurse Communication: Mobility status  Activity Tolerance: Patient limited by fatigue Patient left: in bed;with call bell/phone within reach;with nursing/sitter in room;with family/visitor present   Time: 1330-1401 OT Time Calculation (min): 31 min Charges:  OT General Charges $OT Visit: 1 Procedure OT Evaluation $OT Eval Low Complexity: 1 Procedure OT Treatments $Self Care/Home Management : 8-22 mins G-Codes:    Payton Mccallum D 29-Oct-2015, 3:08 PM

## 2015-10-18 NOTE — Clinical Social Work Placement (Signed)
CSW has sent information out to [facility]Fs - will follow-up with SNF bed offers when available.    Raynaldo Opitz, [facility] Clinical Social Worker cell #: [phone]     CLINICAL SOCIAL WORK PLACEMENT  NOTE  Date:  10/18/2015  Patient Details  Name: Felicia Richmond MRN: [id number] Date of Birth: 1961/03/23  Clinical Social Work is seeking post-discharge placement for this patient at the [place] level of care (*CSW will initial, date and re-position this form in  chart as items are completed):  Yes   Patient/family provided with [place] Work Department's list of facilities offering this level of care within the geographic area requested by the patient (or if unable, by the patient's family).  Yes   Patient/family informed of their freedom to choose among providers that offer the needed level of care, that participate in Medicare, Medicaid or managed care program needed by the patient, have an available bed and are willing to accept the patient.  Yes   Patient/family informed of [facility]'s ownership interest in [facility] and [facility], as well as of the fact that they are under no obligation to receive care at these facilities.  PASRR submitted to EDS on 10/18/15     PASRR number received on 10/18/15     Existing PASRR number confirmed on       FL2 transmitted to all facilities in geographic area requested by pt/family on 10/18/15     FL2 transmitted to all facilities within larger geographic area on       Patient informed that his/her managed care company has contracts with or will negotiate with certain facilities, including the following:            Patient/family informed of bed offers received.  Patient chooses bed at       Physician recommends and patient chooses bed at      Patient to be transferred to   on  .  Patient to be transferred to facility by       Patient  family notified on   of transfer.  Name of family member notified:        PHYSICIAN       Additional Comment:    _______________________________________________ Standley Brooking, LCSW 10/18/2015, 3:28 PM

## 2015-10-18 NOTE — Progress Notes (Signed)
Physical Therapy Treatment Patient Details Name: Candia Kingsbury MRN: [id number] DOB: 09/25/1960 Today's Date: 10/18/2015    History of Present Illness 55 yo female admitted with C diff diarrhea. hx of cancer (met melanoma), emphysema, Meniere's disease, HTN, CVA,     PT Comments    Pt agreeable to bed exercises for LE on today. Pt reported she sat EOB with OT earlier and she was too fatigued. Continue to recommend SNF.   Follow Up Recommendations  SNF     Equipment Recommendations  Rolling walker with 5" wheels (youth height)    Recommendations for Other Services       Precautions / Restrictions Precautions Precautions: Fall Restrictions Weight Bearing Restrictions: No    Mobility  Bed Mobility       Transfers                   Ambulation/Gait                 Stairs            Wheelchair Mobility    Modified Rankin (Stroke Patients Only)       Balance Overall balance assessment: Needs assistance Sitting-balance support: Single extremity supported Sitting balance-Leahy Scale: Fair                              Cognition Arousal/Alertness: Awake/alert Behavior During Therapy: WFL for tasks assessed/performed Overall Cognitive Status: Within Functional Limits for tasks assessed                      Exercises General Exercises - Lower Extremity Ankle Circles/Pumps: AROM;Both;10 reps;Supine Quad Sets: AROM;Both;10 reps;Supine Heel Slides: AAROM;Both;10 reps;Supine Hip ABduction/ADduction: AAROM;Both;10 reps;Supine    General Comments        Pertinent Vitals/Pain Pain Assessment: Faces Pain Score: 4  Faces Pain Scale: Hurts little more Pain Location: back, bil LEs Pain Descriptors / Indicators: Sore Pain Intervention(s): Monitored during session;Repositioned    Home Living Family/patient expects to be discharged to:: Skilled nursing facility                    Prior Function Level of Independence:  Needs assistance    ADL's / Homemaking Assistance Needed: husband assisted with bathing, dressing     PT Goals (current goals can now be found in the care plan section) Acute Rehab PT Goals Patient Stated Goal: less pain. get better Progress towards PT goals: Progressing toward goals (slowly)    Frequency  Min 3X/week    PT Plan Current plan remains appropriate    Co-evaluation             End of Session   Activity Tolerance: Patient limited by fatigue Patient left: in bed;with call bell/phone within reach;with bed alarm set     Time: 1457-1505 PT Time Calculation (min) (ACUTE ONLY): 8 min  Charges:  $Therapeutic Exercise: 8-22 mins                    G Codes:      Weston Anna, MPT Pager: [phone]

## 2015-10-18 NOTE — Progress Notes (Signed)
[facility] PROGRESS NOTE  Asena Igel [id number] DOB: 06-May-1961 DOA: 10/02/2015 PCP: Nance Pear., NP  Assessment/Plan: #1 C. difficile colitis/sepsis/lymphocytic colitis Patient with persistent diarrhea with biopsy-proven lymphocytic colitis. Patient was on Entocort. Diarrhea worsened with combination of ipilimumab side effect as well as a C differential colitis. Patient was started on oral vancomycin the slide which still was not improvement and subsequently started on Flagyl. Patient noted to be the criteria for sepsis secondary to lactic acidosis, hypotension, tachycardia. Oncology is started patient on Remicade first dose 10/10/2015. Patient currently on IV steroids. Patient was seen in consultation by critical care and was on the dedicated 10/12/2015 were patient was not on a bicarbonate drip as well as LR bolus. Dr. Posey Pronto discuss with oncology and gastroenterology and Flagyl has been discontinued. Repeat C. difficile was negative. Continue contact precautions. Supportive care.  #2 acute hypoxic respiratory failure/influenza A CT chest negative for PE. Sputum Gram stain and culture pending. Change IV Solu-Medrol to oral prednisone and taper. Continue duo nebs. Will give a dose of Lasix 40 mg IV 1. Continue Tamiflu. Follow.  #3 urinary retention Patient was able to void after Foley catheter was discontinued.  #4 metastatic melanoma Pleural fluid cytology negative. Per oncology. Patient with metastatic disease and likely with a poor prognosis. Palliative care consultation pending.   #5 failure to thrive No further nausea or vomiting. Some improvement with oral intake. Continue nutritional supplementation.  #6 hypothyroidism/Hx hyperthroidism Patient noted to have a significantly elevated TSH of 15.32 and undetectable free T4 levels. Methimazole has been discontinued. Continue current dose Synthroid. Will likely need outpatient follow-up.  #7 oral thrush Status post 1  week fluconazole. Nystatin.  #8 metabolic acidosis Resolved.   Code Status: DO NOT RESUSCITATE Family Communication: Updated patient and daughter and son-in-law at bedside. Disposition Plan: To skilled nursing facility when medically stable and on oral medications hopefully in 1-2 days.   Consultants:  Oncology: Dr.Ennever 10/03/2015  Gastroenterology: Dr. Amedeo Plenty 10/08/2015  PCCM: Dr. Ashok Cordia 10/11/2015  Palliative care Dr. Hilma Favors 10/18/2015  Procedures:  2 units packed red blood cells 10/17/2015  CT angiogram chest 10/15/2015  CT abdomen and pelvis 10/03/2015  Chest x-ray 10/05/2015, 10/11/2015, 10/18/2015  Abdominal x-ray 10/11/2015, 10/12/2015  Thoracentesis 10/05/2015  Antibiotics:  IV vancomycin 10/03/2015>>>> 10/14/2015  HPI/Subjective: Patient states feeling better. Patient with complaints of shortness of breath. Patient denies any chest pain.  Objective: Filed Vitals:   10/18/15 0628 10/18/15 0808  BP: 115/68   Pulse: 96 96  Temp: 98 F (36.7 C)   Resp: 18 18    Intake/Output Summary (Last 24 hours) at 10/18/15 1203 Last data filed at 10/18/15 1126  Gross per 24 hour  Intake    989 ml  Output   1800 ml  Net   -811 ml   Filed Weights   10/03/15 1420 10/09/15 2100 10/12/15 0423  Weight: 66.1 kg (145 lb 11.6 oz) 65.4 kg (144 lb 2.9 oz) 79.7 kg (175 lb 11.3 oz)    Exam:   General:  NAD  Cardiovascular: RRR  Respiratory: CTAB anterior lung fields.  Abdomen: Soft, nontender, nondistended, positive bowel sounds.  Musculoskeletal: No clubbing or cyanosis. 2-3+ bilateral lower extremity edema to hips.  Data Reviewed: Basic Metabolic Panel:  Recent Labs Lab 10/14/15 0436 10/15/15 0330 10/15/15 1850 10/16/15 0440 10/17/15 0500 10/18/15 0430  NA 139 137 135 139 138 137  K 4.0 4.1 3.9 4.0 3.7 3.0*  CL 111 108 108 109 103 97*  CO2 21* 21*  20* 24 28 31   GLUCOSE 91 86 89 76 69 62*  BUN 6 5* 5* <5* <5* <5*  CREATININE 0.97 0.87 0.82  0.71 0.64 0.48  CALCIUM 6.7* 6.7* 6.7* 7.2* 7.2* 7.0*  MG 2.1 1.9  --  1.8 1.7 2.0  PHOS 2.5 2.2*  --  3.0 3.2 2.1*   Liver Function Tests:  Recent Labs Lab 10/12/15 0034  10/15/15 0330 10/15/15 1850 10/16/15 0440 10/17/15 0500 10/18/15 0430  AST 15  --   --  18  --   --   --   ALT 11*  --   --  13*  --   --   --   ALKPHOS 147*  --   --  348*  --   --   --   BILITOT 0.7  --   --  0.6  --   --   --   PROT <3.0*  --   --  4.1*  --   --   --   ALBUMIN 1.5*  < > 2.0* 2.3* 2.2* 3.0* 2.6*  < > = values in this interval not displayed. No results for input(s): LIPASE, AMYLASE in the last 168 hours. No results for input(s): AMMONIA in the last 168 hours. CBC:  Recent Labs Lab 10/15/15 0330 10/15/15 1850 10/16/15 0440 10/17/15 0500 10/18/15 0430  WBC 16.0* 12.1* 11.3* 11.7* 10.6*  NEUTROABS  --  10.5*  --   --   --   HGB 9.6* 10.3* 9.9* 8.5* 11.8*  HCT 30.6* 32.7* 31.9* 27.3* 36.8  MCV 81.2 81.3 81.8 83.2 83.6  PLT 249 275 236 205 179   Cardiac Enzymes: No results for input(s): CKTOTAL, CKMB, CKMBINDEX, TROPONINI in the last 168 hours. BNP (last 3 results) No results for input(s): BNP in the last 8760 hours.  ProBNP (last 3 results) No results for input(s): PROBNP in the last 8760 hours.  CBG: No results for input(s): GLUCAP in the last 168 hours.  Recent Results (from the past 240 hour(s))  C difficile quick scan w PCR reflex     Status: None   Collection Time: 10/15/15 10:26 AM  Result Value Ref Range Status   C Diff antigen NEGATIVE NEGATIVE Final   C Diff toxin NEGATIVE NEGATIVE Final   C Diff interpretation Negative for toxigenic C. difficile  Final     Studies: Dg Chest 2 View  10/18/2015  CLINICAL DATA:  55 year old female with shortness of breath. EXAM: CHEST  2 VIEW COMPARISON:  10/15/2015 chest CT and prior exams. FINDINGS: Cardiomediastinal silhouette is unchanged with right hilar adenopathy again noted. The right central venous catheter is present with  tip overlying the superior cavoatrial junction. Bilateral pleural effusions have decreased, now small and right greater than left. Bibasilar atelectasis is identified. There is no evidence of pneumothorax. IMPRESSION: Decreased pleural effusions, now small, right greater than left. Mild basilar atelectasis. Unchanged right mediastinal adenopathy. Electronically Signed   By: Margarette Canada M.D.   On: 10/18/2015 10:38    Scheduled Meds: . feeding supplement  1 Container Oral TID BM  . feeding supplement (ENSURE ENLIVE)  237 mL Oral TID BM  . heparin subcutaneous  5,000 Units Subcutaneous 3 times per day  . ipratropium  0.5 mg Nebulization TID  . levalbuterol  0.63 mg Nebulization TID  . levothyroxine  50 mcg Oral QAC breakfast  . methylPREDNISolone (SOLU-MEDROL) injection  40 mg Intravenous Q24H  . morphine  15 mg Oral Q12H  . nystatin  5 mL Oral QID  . oseltamivir  75 mg Oral BID  . potassium chloride  10 mEq Intravenous Q1 Hr x 4  . potassium chloride  40 mEq Oral Q4H  . ranitidine  150 mg Oral BID   Continuous Infusions:   Principal Problem:   C. difficile diarrhea Active Problems:   Hyperthyroidism   Metastatic melanoma (HCC)   Diarrhea   Hypokalemia   Abdominal pain   Malnutrition of moderate degree   Hypotension   Lymphocytic colitis   Lactic acidosis   Metabolic acidosis   Sinus tachycardia (HCC)   Sepsis (HCC)   C. difficile colitis   Acute encephalopathy   Acute respiratory failure with hypoxia (HCC)   Influenza A    Time spent: 33 minutes    THOMPSON,DANIEL M.D. [facility] Pager [phone]. If 7PM-7AM, please contact night-coverage at [email or web address], password [facility] 10/18/2015, 12:03 PM  LOS: 16 days

## 2015-10-18 NOTE — NC FL2 (Signed)
[place] LEVEL OF CARE SCREENING TOOL     IDENTIFICATION  Patient Name: Felicia Richmond Birthdate: 07-04-1961 Sex: female Admission Date (Current Location): 10/02/2015  [facility] and [place] Number:  Herbalist and Address:  [facility],  [place], [place]      Provider Number: [id number]  Attending Physician Name and Address:  Eugenie Filler, MD  Relative Name and Phone Number:       Current Level of Care: Hospital Recommended Level of Care: [place] Prior Approval Number:    Date Approved/Denied:   PASRR Number: [id number]  Discharge Plan: SNF    Current Diagnoses: Patient Active Problem List   Diagnosis Date Noted  . Influenza A   . Acute respiratory failure with hypoxia ([place]) 10/15/2015  . Acute encephalopathy   . C. difficile colitis   . Lymphocytic colitis 10/10/2015  . Lactic acidosis 10/10/2015  . Metabolic acidosis [id number]  . Sinus tachycardia ([place]) 10/10/2015  . Sepsis ([place]) 10/10/2015  . Hypotension 10/09/2015  . C. difficile diarrhea   . Malnutrition of moderate degree 10/03/2015  . Hypokalemia 10/02/2015  . Abdominal pain 10/02/2015  . Diarrhea 09/18/2015  . Hyperlipidemia 04/26/2015  . Preventative health care 04/26/2015  . Metastatic melanoma ([place]) 03/22/2015  . Chronic diarrhea 03/22/2015  . Allergic conjunctivitis 03/22/2015  . Pain 10/27/2014  . CAFL (chronic airflow limitation) ([place]) 10/10/2014  . Hyperthyroidism 10/10/2014  . Elevated WBC count 10/10/2014  . Auditory vertigo 10/10/2014  . Snores 10/10/2014  . 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 Cerebral vascular accident ([place]) 10/10/2014  . 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 Adiposity 09/14/2014    Orientation RESPIRATION BLADDER Height & Weight     Self, Time, Situation, Place  Normal Continent Weight: 175 lb 11.3 oz (79.7 kg) Height:  5\' 1"  (154.9 cm)  BEHAVIORAL SYMPTOMS/MOOD NEUROLOGICAL BOWEL NUTRITION  STATUS      Continent Diet (soft diet)  AMBULATORY STATUS COMMUNICATION OF NEEDS Skin   Extensive Assist Verbally Normal                       Personal Care Assistance Level of Assistance  Bathing, Dressing Bathing Assistance: Limited assistance   Dressing Assistance: Limited assistance     Functional Limitations Info             SPECIAL CARE FACTORS FREQUENCY  PT (By licensed PT)     PT Frequency: 5 OT Frequency: 5            Contractures      Additional Factors Info  Code Status, Allergies, Isolation Precautions Code Status Info: DNR Allergies Info: Cotellic, Oxycodone, Pseudoephedrine, Scopolamine, Tramadol     Isolation Precautions Info: Droplet/Enteric - positive for flu & positive for CDiff     Current Medications (10/18/2015):  This is the current hospital active medication list Current Facility-Administered Medications  Medication Dose Route Frequency Provider Last Rate Last Dose  . acetaminophen (TYLENOL) tablet 650 mg  650 mg Oral Q6H PRN Rise Patience, MD       Or  . acetaminophen (TYLENOL) suppository 650 mg  650 mg Rectal Q6H PRN Rise Patience, MD      . feeding supplement (BOOST / RESOURCE BREEZE) liquid 1 Container  1 Container Oral TID BM Janece Canterbury, MD   1 Container at 10/17/15 2129  . feeding supplement (ENSURE ENLIVE) (ENSURE ENLIVE) liquid 237 mL  237 mL Oral TID BM Pranav  Jerilynn Som, MD   237 mL at 10/18/15 1029  . heparin injection 5,000 Units  5,000 Units Subcutaneous 3 times per day Javier Glazier, MD   5,000 Units at 10/18/15 1340  . ipratropium (ATROVENT) nebulizer solution 0.5 mg  0.5 mg Nebulization TID Lavina Hamman, MD   0.5 mg at 10/18/15 0808  . levalbuterol (XOPENEX) nebulizer solution 0.63 mg  0.63 mg Nebulization Q6H PRN Lavina Hamman, MD   0.63 mg at 10/16/15 1919  . levalbuterol (XOPENEX) nebulizer solution 0.63 mg  0.63 mg Nebulization TID Lavina Hamman, MD   0.63 mg at 10/18/15 [id number]  . levothyroxine  (SYNTHROID, LEVOTHROID) tablet 50 mcg  50 mcg Oral QAC breakfast Lavina Hamman, MD   50 mcg at 10/18/15 1023  . liver oil-zinc oxide (DESITIN) 40 % ointment   Topical Q1H PRN Janece Canterbury, MD      . methylPREDNISolone sodium succinate (SOLU-MEDROL) 40 mg/mL injection 40 mg  40 mg Intravenous Q24H Lavina Hamman, MD   40 mg at 10/17/15 1808  . morphine (MS CONTIN) 12 hr tablet 15 mg  15 mg Oral Q12H Donita Brooks, NP   15 mg at 10/18/15 1014  . morphine (MSIR) tablet 15 mg  15 mg Oral Q3H PRN Janece Canterbury, MD   15 mg at 10/10/15 1827  . nystatin (MYCOSTATIN) 100000 UNIT/ML suspension 500,000 Units  5 mL Oral QID Lavina Hamman, MD   500,000 Units at 10/17/15 2124  . ondansetron (ZOFRAN) injection 4 mg  4 mg Intravenous Q6H PRN Lavina Hamman, MD   4 mg at 10/15/15 1457  . oseltamivir (TAMIFLU) capsule 75 mg  75 mg Oral BID Lavina Hamman, MD   75 mg at 10/18/15 1014  . [COMPLETED] potassium chloride 10 mEq in 50 mL *CENTRAL LINE* IVPB  10 mEq Intravenous Q1 Hr x 4 Volanda Napoleon, MD   10 mEq at 10/18/15 1358  . ranitidine (ZANTAC) 150 MG/10ML syrup 150 mg  150 mg Oral BID Lavina Hamman, MD   150 mg at 10/17/15 0929  . sodium chloride flush (NS) 0.9 % injection 10-40 mL  10-40 mL Intracatheter PRN Rise Patience, MD   10 mL at 10/18/15 0430     Discharge Medications: Please see discharge summary for a list of discharge medications.  Relevant Imaging Results:  Relevant Lab Results:   Additional Information SSN: [id number]  Standley Brooking, LCSW

## 2015-10-18 NOTE — Consult Note (Signed)
Consult received and it was a pleasure to meet Felicia Richmond and Felicia Richmond and Felicia Richmond. She has had a complicated course of treatment for Felicia metastatic melanoma over the past few months with multiple infections, thrush, influenza and immunotherapy induced diarrhea for which she has been treated with antibiotics, high dose steroids and TNF blockers. She is weak and deconditioned.   We discussed Felicia goals of care and specifically focused on discharge planning-  Ultimately she wants to be at home- Felicia sister will be coming to town in about 2-3 weeks to stay and care for Felicia. Until that time Felicia Richmond will need to go to short term rehab with palliative care following and transition home possibly with hospice care once Felicia sister gets into town. Richmond an Felicia Richmond very supportive.  She has DNR in place-she is coping well with Felicia condition and prognosis- Richmond having very appropriate grief today.  If Felicia diarrhea persists I would recommend a trial of octreotide- which may help with the immuno.chemo induced aspects of Felicia diarrhea and may be steroid sparing- its seems now that in general the diarrhea is resolving and improving.  Time: 12-1240PM Total Time: 40 minutes

## 2015-10-18 NOTE — Clinical Social Work Note (Signed)
Clinical Social Work Assessment  Patient Details  Name: Felicia Richmond MRN: [id number] Date of Birth: [birth year]  Date of referral:  10/18/15               Reason for consult:  Facility Placement                Permission sought to share information with:  Chartered certified accountant granted to share information::  Yes, Verbal Permission Granted  Name::        Agency::     Relationship::     Contact Information:     Housing/Transportation Living arrangements for the past 2 months:  Single Family Home Source of Information:  Adult Children Patient Interpreter Needed:  None Criminal Activity/Legal Involvement Pertinent to Current Situation/Hospitalization:  No - Comment as needed Significant Relationships:  Adult Children, Spouse Lives with:  Spouse Do you feel safe going back to the place where you live?  No Need for family participation in patient care:  Yes (Comment)  Care giving concerns:  CSW received consult for SNF placement.    Social Worker assessment / plan:  CSW spoke with patient's daughter, Autumn & son-in-law, Mia Creek re: SNF placement.   Employment status:    Insurance information:  Managed Care PT Recommendations:  [place] / Referral to community resources:  [place]  Patient/Family's Response to care:  Patient's son-in-law, Mia Creek is a Surveyor, quantity in [place] & is therefore, familiar with SNF placement process. CSW provided list of facilities and explained that we would be calling them with a narrowed down list with the ones that are in-network with her insurance ([facility]) and have beds available. Patient's son-in-law requested [facility] (CSW left message for them to call back), [facility] (CSW left voicemail with them to call back) and [facility].   Patient/Family's Understanding of and Emotional Response to Diagnosis, Current Treatment, and Prognosis:  Patient is currently on isolation precautions for flu &  CDiff.   Emotional Assessment Appearance:    Attitude/Demeanor/Rapport:    Affect (typically observed):    Orientation:  Oriented to Self, Oriented to Place, Oriented to  Time, Oriented to Situation Alcohol / Substance use:    Psych involvement (Current and /or in the community):     Discharge Needs  Concerns to be addressed:    Readmission within the last 30 days:    Current discharge risk:    Barriers to Discharge:      Standley Brooking, LCSW 10/18/2015, 3:24 PM

## 2015-10-19 ENCOUNTER — Other Ambulatory Visit: Payer: Self-pay | Admitting: *Deleted

## 2015-10-19 DIAGNOSIS — C439 Malignant melanoma of skin, unspecified: Secondary | ICD-10-CM

## 2015-10-19 DIAGNOSIS — C799 Secondary malignant neoplasm of unspecified site: Secondary | ICD-10-CM

## 2015-10-19 DIAGNOSIS — R110 Nausea: Secondary | ICD-10-CM

## 2015-10-19 DIAGNOSIS — E0590 Thyrotoxicosis, unspecified without thyrotoxic crisis or storm: Secondary | ICD-10-CM

## 2015-10-19 DIAGNOSIS — K591 Functional diarrhea: Secondary | ICD-10-CM

## 2015-10-19 DIAGNOSIS — N61 Mastitis without abscess: Secondary | ICD-10-CM

## 2015-10-19 DIAGNOSIS — R599 Enlarged lymph nodes, unspecified: Secondary | ICD-10-CM

## 2015-10-19 LAB — RENAL FUNCTION PANEL
Albumin: 2.4 g/dL — ABNORMAL LOW (ref 3.5–5.0)
Anion gap: 6 (ref 5–15)
BUN: 6 mg/dL (ref 6–20)
CHLORIDE: 97 mmol/L — AB (ref 101–111)
CO2: 33 mmol/L — ABNORMAL HIGH (ref 22–32)
Calcium: 6.8 mg/dL — ABNORMAL LOW (ref 8.9–10.3)
Creatinine, Ser: 0.49 mg/dL (ref 0.44–1.00)
GFR calc Af Amer: >60 mL/min (ref >60)
GFR calc non Af Amer: >60 mL/min (ref >60)
GLUCOSE: 93 mg/dL (ref 65–99)
POTASSIUM: 4.4 mmol/L (ref 3.5–5.1)
Phosphorus: 2.5 mg/dL (ref 2.5–4.6)
Sodium: 136 mmol/L (ref 135–145)

## 2015-10-19 LAB — CBC
HEMATOCRIT: 36.6 % (ref 36.0–46.0)
Hemoglobin: 11.6 g/dL — ABNORMAL LOW (ref 12.0–15.0)
MCH: 26.8 pg (ref 26.0–34.0)
MCHC: 31.7 g/dL (ref 30.0–36.0)
MCV: 84.5 fL (ref 78.0–100.0)
PLATELETS: 153 10*3/uL (ref 150–400)
RBC: 4.33 MIL/uL (ref 3.87–5.11)
RDW: 19.9 % — AB (ref 11.5–15.5)
WBC: 9.5 10*3/uL (ref 4.0–10.5)

## 2015-10-19 LAB — MAGNESIUM: MAGNESIUM: 1.8 mg/dL (ref 1.7–2.4)

## 2015-10-19 MED ORDER — TRAMETINIB DIMETHYL SULFOXIDE 2 MG PO TABS: 2.0000 mg | ORAL_TABLET | Freq: Every day | ORAL | Status: DC

## 2015-10-19 MED ORDER — LEVALBUTEROL HCL 0.63 MG/3ML IN NEBU: 0.6300 mg | INHALATION_SOLUTION | RESPIRATORY_TRACT | Status: DC | PRN

## 2015-10-19 MED ORDER — IPRATROPIUM BROMIDE 0.02 % IN SOLN
0.5000 mg | Freq: Three times a day (TID) | RESPIRATORY_TRACT | Status: DC
  Administered 2015-10-19 – 2015-10-22 (×9): 0.5 mg via RESPIRATORY_TRACT
  Filled 2015-10-19 (×9): qty 2.5

## 2015-10-19 MED ORDER — LEVALBUTEROL HCL 0.63 MG/3ML IN NEBU
0.6300 mg | INHALATION_SOLUTION | Freq: Four times a day (QID) | RESPIRATORY_TRACT | Status: DC
  Administered 2015-10-19: 0.63 mg via RESPIRATORY_TRACT
  Filled 2015-10-19: qty 3

## 2015-10-19 MED ORDER — DABRAFENIB MESYLATE 75 MG PO CAPS: 150.0000 mg | ORAL_CAPSULE | Freq: Two times a day (BID) | ORAL | Status: DC

## 2015-10-19 MED ORDER — IPRATROPIUM BROMIDE 0.02 % IN SOLN
0.5000 mg | Freq: Four times a day (QID) | RESPIRATORY_TRACT | Status: DC
  Administered 2015-10-19: 0.5 mg via RESPIRATORY_TRACT
  Filled 2015-10-19: qty 2.5

## 2015-10-19 MED ORDER — LEVALBUTEROL HCL 0.63 MG/3ML IN NEBU
0.6300 mg | INHALATION_SOLUTION | Freq: Three times a day (TID) | RESPIRATORY_TRACT | Status: DC
  Administered 2015-10-19 – 2015-10-22 (×9): 0.63 mg via RESPIRATORY_TRACT
  Filled 2015-10-19 (×9): qty 3

## 2015-10-19 MED ORDER — FUROSEMIDE 10 MG/ML IJ SOLN
40.0000 mg | Freq: Two times a day (BID) | INTRAMUSCULAR | Status: AC
  Administered 2015-10-19 (×2): 40 mg via INTRAVENOUS
  Filled 2015-10-19 (×2): qty 4

## 2015-10-19 NOTE — Clinical Social Work Note (Signed)
CSW spoke with [facility] to provide additional information.  SNF will be sending in paperwork to [facility] and will call back with an authorization number.  Pt's flu medication will be done on Saturday so SNF stated they will have a bed for pt on Sunday. CSW provided daughter with this information and she is still trying to reach [employer]BS to gain more info on the cost if any.  SNF was unable to provide any info if any money will be needed from pt at this time.  Dede Query, LCSW [place] Worker - Weekend Coverage cell #: [phone]

## 2015-10-19 NOTE — Progress Notes (Signed)
[facility] PROGRESS NOTE  Felicia Richmond [id number] DOB: 11/14/1960 DOA: 10/02/2015 PCP: Nance Pear., NP  Assessment/Plan: #1 C. difficile colitis/sepsis/lymphocytic colitis Patient with persistent diarrhea with biopsy-proven lymphocytic colitis. Patient was on Entocort. Diarrhea worsened with combination of ipilimumab side effect as well as a C differential colitis. Patient was started on oral vancomycin the slide which still was not improvement and subsequently started on Flagyl. Patient noted to be the criteria for sepsis secondary to lactic acidosis, hypotension, tachycardia. Oncology is started patient on Remicade first dose 10/10/2015. Patient currently on IV steroids. Patient was seen in consultation by critical care and was on the dedicated 10/12/2015 were patient was not on a bicarbonate drip as well as LR bolus. Dr. Posey Pronto discuss with oncology and gastroenterology and Flagyl has been discontinued. Repeat C. difficile was negative. Continue contact precautions. Supportive care.  #2 acute hypoxic respiratory failure/influenza A CT chest negative for PE. Sputum Gram stain and culture pending. Continue oral prednisone and taper. Continue duo nebs. Will give a dose of Lasix 40 mg IV 2. Continue Tamiflu. Follow.  #3 urinary retention Patient was able to void after Foley catheter was discontinued.  #4 metastatic melanoma Pleural fluid cytology negative. Per oncology. Patient with metastatic disease and likely with a poor prognosis. Palliative care has consulted recommending discharged to SNF with palliative care following.   #5 failure to thrive No further nausea or vomiting. Some improvement with oral intake. Continue nutritional supplementation.  #6 hypothyroidism/Hx hyperthroidism Patient noted to have a significantly elevated TSH of 15.32 and undetectable free T4 levels. Methimazole has been discontinued. Continue current dose Synthroid. Will likely need outpatient  follow-up.  #7 oral thrush Status post 1 week fluconazole. Nystatin.  #8 metabolic acidosis Resolved.   Code Status: DO NOT RESUSCITATE Family Communication: Updated patient and daughter and son-in-law at bedside. Disposition Plan: To skilled nursing facility with palliative care following, when medically stable and on oral medications and when Tamiflu has been completed hopefully in 1-2 days.   Consultants:  Oncology: Dr.Ennever 10/03/2015  Gastroenterology: Dr. Amedeo Plenty 10/08/2015  PCCM: Dr. Ashok Cordia 10/11/2015  Palliative care Dr. Hilma Favors 10/18/2015  Procedures:  2 units packed red blood cells 10/17/2015  CT angiogram chest 10/15/2015  CT abdomen and pelvis 10/03/2015  Chest x-ray 10/05/2015, 10/11/2015, 10/18/2015  Abdominal x-ray 10/11/2015, 10/12/2015  Thoracentesis 10/05/2015  Antibiotics:  IV vancomycin 10/03/2015>>>> 10/14/2015  HPI/Subjective: Patient states feeling better. Patient states shortness of breath improvement. No chest pain. Patient complaining of significant weakness.   Objective: Filed Vitals:   10/19/15 0835 10/19/15 1015  BP:  111/64  Pulse: 94 99  Temp:  97.8 F (36.6 C)  Resp: 18 18    Intake/Output Summary (Last 24 hours) at 10/19/15 1156 Last data filed at 10/19/15 0450  Gross per 24 hour  Intake    430 ml  Output   1700 ml  Net  -1270 ml   Filed Weights   10/03/15 1420 10/09/15 2100 10/12/15 0423  Weight: 66.1 kg (145 lb 11.6 oz) 65.4 kg (144 lb 2.9 oz) 79.7 kg (175 lb 11.3 oz)    Exam:   General:  NAD  Cardiovascular: RRR  Respiratory: CTAB anterior lung fields.  Abdomen: Soft, nontender, nondistended, positive bowel sounds.  Musculoskeletal: No clubbing or cyanosis. 1-2+ bilateral lower extremity edema to hips.  Data Reviewed: Basic Metabolic Panel:  Recent Labs Lab 10/15/15 0330 10/15/15 1850 10/16/15 0440 10/17/15 0500 10/18/15 0430 10/19/15 0500  NA 137 135 139 138 137 136  K 4.1  3.9 4.0 3.7 3.0*  4.4  CL 108 108 109 103 97* 97*  CO2 21* 20* 24 28 31  33*  GLUCOSE 86 89 76 69 62* 93  BUN 5* 5* <5* <5* <5* 6  CREATININE 0.87 0.82 0.71 0.64 0.48 0.49  CALCIUM 6.7* 6.7* 7.2* 7.2* 7.0* 6.8*  MG 1.9  --  1.8 1.7 2.0 1.8  PHOS 2.2*  --  3.0 3.2 2.1* 2.5   Liver Function Tests:  Recent Labs Lab 10/15/15 1850 10/16/15 0440 10/17/15 0500 10/18/15 0430 10/19/15 0500  AST 18  --   --   --   --   ALT 13*  --   --   --   --   ALKPHOS 348*  --   --   --   --   BILITOT 0.6  --   --   --   --   PROT 4.1*  --   --   --   --   ALBUMIN 2.3* 2.2* 3.0* 2.6* 2.4*   No results for input(s): LIPASE, AMYLASE in the last 168 hours. No results for input(s): AMMONIA in the last 168 hours. CBC:  Recent Labs Lab 10/15/15 1850 10/16/15 0440 10/17/15 0500 10/18/15 0430 10/19/15 0500  WBC 12.1* 11.3* 11.7* 10.6* 9.5  NEUTROABS 10.5*  --   --   --   --   HGB 10.3* 9.9* 8.5* 11.8* 11.6*  HCT 32.7* 31.9* 27.3* 36.8 36.6  MCV 81.3 81.8 83.2 83.6 84.5  PLT 275 236 205 179 153   Cardiac Enzymes: No results for input(s): CKTOTAL, CKMB, CKMBINDEX, TROPONINI in the last 168 hours. BNP (last 3 results) No results for input(s): BNP in the last 8760 hours.  ProBNP (last 3 results) No results for input(s): PROBNP in the last 8760 hours.  CBG: No results for input(s): GLUCAP in the last 168 hours.  Recent Results (from the past 240 hour(s))  C difficile quick scan w PCR reflex     Status: None   Collection Time: 10/15/15 10:26 AM  Result Value Ref Range Status   C Diff antigen NEGATIVE NEGATIVE Final   C Diff toxin NEGATIVE NEGATIVE Final   C Diff interpretation Negative for toxigenic C. difficile  Final     Studies: Dg Chest 2 View  10/18/2015  CLINICAL DATA:  55 year old female with shortness of breath. EXAM: CHEST  2 VIEW COMPARISON:  10/15/2015 chest CT and prior exams. FINDINGS: Cardiomediastinal silhouette is unchanged with right hilar adenopathy again noted. The right central venous  catheter is present with tip overlying the superior cavoatrial junction. Bilateral pleural effusions have decreased, now small and right greater than left. Bibasilar atelectasis is identified. There is no evidence of pneumothorax. IMPRESSION: Decreased pleural effusions, now small, right greater than left. Mild basilar atelectasis. Unchanged right mediastinal adenopathy. Electronically Signed   By: Margarette Canada M.D.   On: 10/18/2015 10:38    Scheduled Meds: . dronabinol  2.5 mg Oral BID AC  . feeding supplement  1 Container Oral TID BM  . feeding supplement (ENSURE ENLIVE)  237 mL Oral TID BM  . furosemide  40 mg Intravenous Q12H  . heparin subcutaneous  5,000 Units Subcutaneous 3 times per day  . ipratropium  0.5 mg Nebulization TID  . levalbuterol  0.63 mg Nebulization TID  . levothyroxine  50 mcg Oral QAC breakfast  . morphine  15 mg Oral Q12H  . nystatin  5 mL Oral QID  . oseltamivir  75 mg Oral  BID  . predniSONE  40 mg Oral QAC breakfast  . ranitidine  150 mg Oral BID   Continuous Infusions:   Principal Problem:   C. difficile diarrhea Active Problems:   Hyperthyroidism   Metastatic melanoma (HCC)   Diarrhea   Hypokalemia   Abdominal pain   Malnutrition of moderate degree   Hypotension   Lymphocytic colitis   Lactic acidosis   Metabolic acidosis   Sinus tachycardia (HCC)   Sepsis (HCC)   C. difficile colitis   Acute encephalopathy   Acute respiratory failure with hypoxia (HCC)   Influenza A    Time spent: 69 minutes    THOMPSON,DANIEL M.D. [facility] Pager [phone]. If 7PM-7AM, please contact night-coverage at [email or web address], password [facility] 10/19/2015, 11:56 AM  LOS: 17 days

## 2015-10-19 NOTE — Progress Notes (Signed)
   10/19/15 1200  Clinical Encounter Type  Visited With Health care provider  [place] attempted to visit with pt; pt unavailable; [place] discussed with RN pt status and D/C plan; [place] will attempt future visit, if possible. 12:13 PM [name]

## 2015-10-19 NOTE — Progress Notes (Signed)
Nutrition Follow-up  DOCUMENTATION CODES:   Non-severe (moderate) malnutrition in context of acute illness/injury  INTERVENTION:  -d/c Ensure and Boost Breeze -Provide daily snacks  NUTRITION DIAGNOSIS:   Inadequate oral intake related to acute illness, nausea, poor appetite as evidenced by per patient/family report, meal completion < 50%.  -Ongoing   GOAL:   Patient will meet greater than or equal to 90% of their needs  -Progressing  MONITOR:   PO intake, Weight trends, Labs, Skin, I & O's  ASSESSMENT:   56 y.o. female with history of metastatic melanoma who was recently admitted 2 weeks ago for diarrhea and colonoscopy showed lymphocytic colitis and is on Entocort present to the ER because of recurrence of diarrhea. Patient states over the last 3 days patient is having multiple episodes of diarrhea denies any nausea vomiting but at this time patient is also having abdominal pain diffusely. Denies any antibiotics intake except for Diflucan for oral candidiasis. Denies any fever chills chest pain shortness of breath. Patient has been admitted for further management of dehydration diarrhea and abdominal pain. Patient is also found to be hypokalemic.   Pt states her appetite and nausea have improved today. For breakfast pt had 1/2 [place] Toast, eggs and grape juice. For lunch this afternoon she had ice cream and a little bit of broth. She reports she was not hungry at lunch but tried to eat a little bit. Per chart pt has been consuming between 10-50% of tray. Pt request to discontinue her supplements, states she is not going to drink them anymore.Offered to provide her snacks in between meals to help with PO intake, pt agreed to this. Pt request ice cream and chicken sandwich between meals as these are the two items she enjoys eating. Will provide snacks daily.   Pt does not know if she has lost weight since admission. Last weight entered per chart was 10/12/2015, unable to determine per  chart.   Medications reviewed. Labs reviewed; Calcium 6.8  Diet Order:  Diet regular Room service appropriate?: Yes; Fluid consistency:: Thin  Skin:  Reviewed, no issues  Last BM:  10/18/2015  Height:   Ht Readings from Last 1 Encounters:  10/09/15 5\' 1"  (1.549 m)    Weight:   Wt Readings from Last 1 Encounters:  10/12/15 175 lb 11.3 oz (79.7 kg)    Ideal Body Weight:  47.73 kg (kg)  BMI:  Body mass index is 33.22 kg/(m^2).  Estimated Nutritional Needs:   Kcal:  1920-2115 (30-33 kcal/kg)  Protein:  75-90 grams (1.2-1.4 grams/kg)  Fluid:  >/= 2.1 L/day  EDUCATION NEEDS:   No education needs identified at this time  [place], Dietetic Intern Pager: [phone]

## 2015-10-19 NOTE — Clinical Social Work Note (Signed)
CSW spoke with pt's daughter/Autumn regarding discharge plans.  CSW provided additional information regarding SNF and BCBS authorization process.  Daughter wants to know how much money pt would be responsible for so she is following up with [employer] to gain information.  [facility] for [facility] so CSW spoke with [facility] in admissions to obtain bed availability.  CSW sent PT notes as requested and SNF will send information into [employer] today to obtain an authorization number.  SNF will call back today after DON reviews for answer on possible bed this weekend. CSW explained insurance auth may take awhile and SNF will require money out of pocket to cover until auth is obtained.    Per MD pt has the flu and will take last medication at 10pm Saturday so Sunday should be good for discharge.  Dede Query, LCSW [place] Worker - Weekend Coverage cell #: [phone]

## 2015-10-19 NOTE — Progress Notes (Signed)
Ms. Pecot looks a little bit better today.  She still has some congestion.  Her CXR looks okay.There is no obvious pneumonia. The pleural effusions seem to be less. She does have a mediastinal adenopathy. Maybe increasing her nebulizers might help a little bit.  She was seen by palliative care. I very much appreciate their input.  Looks fine a skill nursing facility for her. I think this makes sense.  She's not having any diarrhea. She's still having some nausea. There is no vomiting. She seems to be eating a bit more in the way of solid food.  Her labs show that her potassium is 4.4. We'll probably stop any supplemental potassium that she is getting.  Physical therapy is trying to work with her. She is incredibly deconditioned.  She still is very much aware of her prognosis. She realizes that options are very limited as to how we can help her.  It would be nice to get her home eventually. I do think that skill nursing may not be a bad idea for her.  Her vital signs all stable. Temperature 97.7. Pulse 92. Blood pressure 134/77. Head and neck exam shows no ocular or oral lesions. She has no scleral icterus. She has no thrush. There is no adenopathy on the neck. Lungs are with better breath sounds. There is still some slight wheezing in the expiratory phase. Cardiac exam regular rate and rhythm with no murmurs, rubs or Vas[place]. Abdomen is soft. She has decent bowel sounds. There is no fluid wave. There is no palpable liver or spleen tip. Extremity shows no edema in the upper extremity is. She has 1+ edema in the lower extremities. Neurological exam shows no focal neurological deficits.  For now, I think the goal is to try to improve her quality of life and performance status, if possible. This will really hinge on how well she eats and how much physical therapy she can do.  Maybe, she'll be able to go skill nursing over the weekend.  I appreciate all the daycare that she is getting from everybody up  on [address]!!!  Lum Keas  Proverbs 19:23

## 2015-10-20 LAB — RENAL FUNCTION PANEL
Albumin: 2.4 g/dL — ABNORMAL LOW (ref 3.5–5.0)
Anion gap: 11 (ref 5–15)
BUN: 8 mg/dL (ref 6–20)
CALCIUM: 7.3 mg/dL — AB (ref 8.9–10.3)
CHLORIDE: 95 mmol/L — AB (ref 101–111)
CO2: 32 mmol/L (ref 22–32)
CREATININE: 0.6 mg/dL (ref 0.44–1.00)
Glucose, Bld: 75 mg/dL (ref 65–99)
Phosphorus: 1.9 mg/dL — ABNORMAL LOW (ref 2.5–4.6)
Potassium: 3.2 mmol/L — ABNORMAL LOW (ref 3.5–5.1)
SODIUM: 138 mmol/L (ref 135–145)

## 2015-10-20 LAB — CBC
HCT: 36.6 % (ref 36.0–46.0)
Hemoglobin: 11.5 g/dL — ABNORMAL LOW (ref 12.0–15.0)
MCH: 27 pg (ref 26.0–34.0)
MCHC: 31.4 g/dL (ref 30.0–36.0)
MCV: 85.9 fL (ref 78.0–100.0)
PLATELETS: 172 10*3/uL (ref 150–400)
RBC: 4.26 MIL/uL (ref 3.87–5.11)
RDW: 20.5 % — AB (ref 11.5–15.5)
WBC: 15.2 10*3/uL — AB (ref 4.0–10.5)

## 2015-10-20 LAB — MAGNESIUM: MAGNESIUM: 1.7 mg/dL (ref 1.7–2.4)

## 2015-10-20 MED ORDER — POTASSIUM CHLORIDE CRYS ER 20 MEQ PO TBCR
40.0000 meq | EXTENDED_RELEASE_TABLET | Freq: Once | ORAL | Status: AC
  Administered 2015-10-20: 40 meq via ORAL
  Filled 2015-10-20: qty 2

## 2015-10-20 MED ORDER — DEXTROSE 5 % IV SOLN
30.0000 mmol | Freq: Once | INTRAVENOUS | Status: AC
  Administered 2015-10-20: 30 mmol via INTRAVENOUS
  Filled 2015-10-20: qty 10

## 2015-10-20 MED ORDER — DEXTROSE 5 % IV SOLN
3.0000 g | Freq: Once | INTRAVENOUS | Status: AC
  Administered 2015-10-20: 3 g via INTRAVENOUS
  Filled 2015-10-20: qty 6

## 2015-10-20 NOTE — Progress Notes (Signed)
[facility] PROGRESS NOTE  Felicia Richmond [id number] DOB: 1960-12-11 DOA: 10/02/2015 PCP: Nance Pear., NP  Assessment/Plan: #1 C. difficile colitis/sepsis/lymphocytic colitis Patient with persistent diarrhea with biopsy-proven lymphocytic colitis. Patient was on Entocort. Diarrhea worsened with combination of ipilimumab side effect as well as a C differential colitis. Patient was started on oral vancomycin the slide which still was not improvement and subsequently started on Flagyl. Patient noted to be the criteria for sepsis secondary to lactic acidosis, hypotension, tachycardia. Oncology is started patient on Remicade first dose 10/10/2015. Patient currently on IV steroids. Patient was seen in consultation by critical care and was on the dedicated 10/12/2015 were patient was not on a bicarbonate drip as well as LR bolus. Dr. Posey Pronto discuss with oncology and gastroenterology and Flagyl has been discontinued. Repeat C. difficile was negative. Continue contact precautions. Supportive care.  #2 acute hypoxic respiratory failure/influenza A CT chest negative for PE. Sputum Gram stain and culture pending. Continue oral prednisone and taper. Continue duo nebs. Continue Tamiflu. Follow.  #3 urinary retention Patient was able to void after Foley catheter was discontinued.  #4 metastatic melanoma Pleural fluid cytology negative. Per oncology. Patient with metastatic disease and likely with a poor prognosis. Palliative care has consulted recommending discharged to SNF with palliative care following.   #5 failure to thrive No further nausea or vomiting. Some improvement with oral intake. Continue nutritional supplementation.  #6 hypothyroidism/Hx hyperthroidism Patient noted to have a significantly elevated TSH of 15.32 and undetectable free T4 levels. Methimazole has been discontinued. Continue current dose Synthroid. Will likely need outpatient follow-up.  #7 oral thrush Status post 1  week fluconazole. Nystatin.  #8 metabolic acidosis Resolved.   Code Status: DO NOT RESUSCITATE Family Communication: Updated patient and daughter and son-in-law at bedside. Disposition Plan: To skilled nursing facility with palliative care following, when medically stable and on oral medications and when Tamiflu has been completed hopefully in 1-2 days.   Consultants:  Oncology: Dr.Ennever 10/03/2015  Gastroenterology: Dr. Amedeo Plenty 10/08/2015  PCCM: Dr. Ashok Cordia 10/11/2015  Palliative care Dr. Hilma Favors 10/18/2015  Procedures:  2 units packed red blood cells 10/17/2015  CT angiogram chest 10/15/2015  CT abdomen and pelvis 10/03/2015  Chest x-ray 10/05/2015, 10/11/2015, 10/18/2015  Abdominal x-ray 10/11/2015, 10/12/2015  Thoracentesis 10/05/2015  Antibiotics:  IV vancomycin 10/03/2015>>>> 10/14/2015  HPI/Subjective: Patient states feeling better. Patient states shortness of breath improvement. No chest pain. Patient complaining of significant weakness.   Objective: Filed Vitals:   10/19/15 2024 10/20/15 0441  BP: 109/61 112/58  Pulse: 103 91  Temp: 98.1 F (36.7 C) 97.8 F (36.6 C)  Resp: 20 20    Intake/Output Summary (Last 24 hours) at 10/20/15 1346 Last data filed at 10/20/15 0239  Gross per 24 hour  Intake    240 ml  Output   1475 ml  Net  -1235 ml   Filed Weights   10/03/15 1420 10/09/15 2100 10/12/15 0423  Weight: 66.1 kg (145 lb 11.6 oz) 65.4 kg (144 lb 2.9 oz) 79.7 kg (175 lb 11.3 oz)    Exam:   General:  NAD  Cardiovascular: RRR  Respiratory: CTAB anterior lung fields.  Abdomen: Soft, nontender, nondistended, positive bowel sounds.  Musculoskeletal: No clubbing or cyanosis. 1-2+ bilateral lower extremity edema to hips.  Data Reviewed: Basic Metabolic Panel:  Recent Labs Lab 10/16/15 0440 10/17/15 0500 10/18/15 0430 10/19/15 0500 10/20/15 0423  NA 139 138 137 136 138  K 4.0 3.7 3.0* 4.4 3.2*  CL 109 103 97* 97*  95*  CO2 24 28  31  33* 32  GLUCOSE 76 69 62* 93 75  BUN <5* <5* <5* 6 8  CREATININE 0.71 0.64 0.48 0.49 0.60  CALCIUM 7.2* 7.2* 7.0* 6.8* 7.3*  MG 1.8 1.7 2.0 1.8 1.7  PHOS 3.0 3.2 2.1* 2.5 1.9*   Liver Function Tests:  Recent Labs Lab 10/15/15 1850 10/16/15 0440 10/17/15 0500 10/18/15 0430 10/19/15 0500 10/20/15 0423  AST 18  --   --   --   --   --   ALT 13*  --   --   --   --   --   ALKPHOS 348*  --   --   --   --   --   BILITOT 0.6  --   --   --   --   --   PROT 4.1*  --   --   --   --   --   ALBUMIN 2.3* 2.2* 3.0* 2.6* 2.4* 2.4*   No results for input(s): LIPASE, AMYLASE in the last 168 hours. No results for input(s): AMMONIA in the last 168 hours. CBC:  Recent Labs Lab 10/15/15 1850 10/16/15 0440 10/17/15 0500 10/18/15 0430 10/19/15 0500 10/20/15 0423  WBC 12.1* 11.3* 11.7* 10.6* 9.5 15.2*  NEUTROABS 10.5*  --   --   --   --   --   HGB 10.3* 9.9* 8.5* 11.8* 11.6* 11.5*  HCT 32.7* 31.9* 27.3* 36.8 36.6 36.6  MCV 81.3 81.8 83.2 83.6 84.5 85.9  PLT 275 236 205 179 153 172   Cardiac Enzymes: No results for input(s): CKTOTAL, CKMB, CKMBINDEX, TROPONINI in the last 168 hours. BNP (last 3 results) No results for input(s): BNP in the last 8760 hours.  ProBNP (last 3 results) No results for input(s): PROBNP in the last 8760 hours.  CBG: No results for input(s): GLUCAP in the last 168 hours.  Recent Results (from the past 240 hour(s))  C difficile quick scan w PCR reflex     Status: None   Collection Time: 10/15/15 10:26 AM  Result Value Ref Range Status   C Diff antigen NEGATIVE NEGATIVE Final   C Diff toxin NEGATIVE NEGATIVE Final   C Diff interpretation Negative for toxigenic C. difficile  Final     Studies: No results found.  Scheduled Meds: . dronabinol  2.5 mg Oral BID AC  . feeding supplement  1 Container Oral TID BM  . feeding supplement (ENSURE ENLIVE)  237 mL Oral TID BM  . heparin subcutaneous  5,000 Units Subcutaneous 3 times per day  . ipratropium  0.5  mg Nebulization TID  . levalbuterol  0.63 mg Nebulization TID  . levothyroxine  50 mcg Oral QAC breakfast  . morphine  15 mg Oral Q12H  . nystatin  5 mL Oral QID  . oseltamivir  75 mg Oral BID  . potassium phosphate IVPB (mmol)  30 mmol Intravenous Once  . ranitidine  150 mg Oral BID   Continuous Infusions:   Principal Problem:   C. difficile diarrhea Active Problems:   Hyperthyroidism   Metastatic melanoma (HCC)   Diarrhea   Hypokalemia   Abdominal pain   Malnutrition of moderate degree   Hypotension   Lymphocytic colitis   Lactic acidosis   Metabolic acidosis   Sinus tachycardia (HCC)   Sepsis (HCC)   C. difficile colitis   Acute encephalopathy   Acute respiratory failure with hypoxia (HCC)   Influenza A   Functional diarrhea  Time spent: 59 minutes    Erla Bacchi M.D. [facility] Pager [phone]. If 7PM-7AM, please contact night-coverage at [email or web address], password [facility] 10/20/2015, 1:46 PM  LOS: 18 days

## 2015-10-21 DIAGNOSIS — R000 Tachycardia, unspecified: Secondary | ICD-10-CM

## 2015-10-21 DIAGNOSIS — E039 Hypothyroidism, unspecified: Secondary | ICD-10-CM

## 2015-10-21 LAB — RENAL FUNCTION PANEL
ANION GAP: 8 (ref 5–15)
Albumin: 2.5 g/dL — ABNORMAL LOW (ref 3.5–5.0)
BUN: 8 mg/dL (ref 6–20)
CALCIUM: 7.2 mg/dL — AB (ref 8.9–10.3)
CHLORIDE: 93 mmol/L — AB (ref 101–111)
CO2: 33 mmol/L — AB (ref 22–32)
CREATININE: 0.48 mg/dL (ref 0.44–1.00)
Glucose, Bld: 62 mg/dL — ABNORMAL LOW (ref 65–99)
PHOSPHORUS: 2.6 mg/dL (ref 2.5–4.6)
Potassium: 3.8 mmol/L (ref 3.5–5.1)
Sodium: 134 mmol/L — ABNORMAL LOW (ref 135–145)

## 2015-10-21 LAB — CBC
HCT: 38.5 % (ref 36.0–46.0)
HEMOGLOBIN: 11.9 g/dL — AB (ref 12.0–15.0)
MCH: 26.7 pg (ref 26.0–34.0)
MCHC: 30.9 g/dL (ref 30.0–36.0)
MCV: 86.3 fL (ref 78.0–100.0)
PLATELETS: 172 10*3/uL (ref 150–400)
RBC: 4.46 MIL/uL (ref 3.87–5.11)
RDW: 20.8 % — ABNORMAL HIGH (ref 11.5–15.5)
WBC: 13.9 10*3/uL — AB (ref 4.0–10.5)

## 2015-10-21 LAB — PHOSPHORUS: PHOSPHORUS: 2.6 mg/dL (ref 2.5–4.6)

## 2015-10-21 LAB — MAGNESIUM: Magnesium: 2.1 mg/dL (ref 1.7–2.4)

## 2015-10-21 MED ORDER — PREDNISONE 20 MG PO TABS
20.0000 mg | ORAL_TABLET | Freq: Every day | ORAL | Status: DC
  Administered 2015-10-21 – 2015-10-22 (×2): 20 mg via ORAL
  Filled 2015-10-21 (×3): qty 1

## 2015-10-21 NOTE — Progress Notes (Signed)
[facility] PROGRESS NOTE  Felicia Richmond [id number] DOB: [birth year] DOA: 10/02/2015 PCP: Nance Pear., NP  Assessment/Plan: #1 C. difficile colitis/sepsis/lymphocytic colitis Patient with persistent diarrhea with biopsy-proven lymphocytic colitis. Patient was on Entocort. Diarrhea worsened with combination of ipilimumab side effect as well as a C differential colitis. Patient was started on oral vancomycin the slide which still was not improvement and subsequently started on Flagyl. Patient noted to be the criteria for sepsis secondary to lactic acidosis, hypotension, tachycardia. Oncology is started patient on Remicade first dose 10/10/2015. Patient currently on IV steroids. Patient was seen in consultation by critical care and was on the dedicated 10/12/2015 were patient was not on a bicarbonate drip as well as LR bolus. Dr. Posey Pronto discuss with oncology and gastroenterology and Flagyl has been discontinued. Repeat C. difficile was negative. Continue contact precautions. Supportive care.  #2 acute hypoxic respiratory failure/influenza A CT chest negative for PE. Sputum Gram stain and culture pending. Continue oral prednisone and taper. Continue duo nebs. Continue Tamiflu. Follow.  #3 urinary retention Patient was able to void after Foley catheter was discontinued.  #4 metastatic melanoma Pleural fluid cytology negative. Per oncology. Patient with metastatic disease and likely with a poor prognosis. Palliative care has consulted recommending discharged to SNF with palliative care following.   #5 failure to thrive No further nausea or vomiting. Some improvement with oral intake. Continue nutritional supplementation.  #6 hypothyroidism/Hx hyperthroidism Patient noted to have a significantly elevated TSH of 15.32 and undetectable free T4 levels. Methimazole has been discontinued. Continue current dose Synthroid. Will likely need outpatient follow-up.  #7 oral thrush Status post 1  week fluconazole. Nystatin.  #8 metabolic acidosis Resolved.   Code Status: DO NOT RESUSCITATE Family Communication: Updated patient and daughter and son-in-law at bedside. Disposition Plan: To skilled nursing facility with palliative care following, hopefully tomorrow.   Consultants:  Oncology: Dr.Ennever 10/03/2015  Gastroenterology: Dr. Amedeo Plenty 10/08/2015  PCCM: Dr. Ashok Cordia 10/11/2015  Palliative care Dr. Hilma Favors 10/18/2015  Procedures:  2 units packed red blood cells 10/17/2015  CT angiogram chest 10/15/2015  CT abdomen and pelvis 10/03/2015  Chest x-ray 10/05/2015, 10/11/2015, 10/18/2015  Abdominal x-ray 10/11/2015, 10/12/2015  Thoracentesis 10/05/2015  Antibiotics:  IV vancomycin 10/03/2015>>>> 10/14/2015  HPI/Subjective: Patient in bed family at bedside. No chest pain. No shortness of breath. Patient had normal bowel movement today. Patient with some complaints of some abdominal pain.  Objective: Filed Vitals:   10/20/15 2247 10/21/15 0628  BP: 118/50 128/64  Pulse: 104 94  Temp: 98.3 F (36.8 C) 97 F (36.1 C)  Resp:  18    Intake/Output Summary (Last 24 hours) at 10/21/15 1431 Last data filed at 10/21/15 0358  Gross per 24 hour  Intake      0 ml  Output    150 ml  Net   -150 ml   Filed Weights   10/03/15 1420 10/09/15 2100 10/12/15 0423  Weight: 66.1 kg (145 lb 11.6 oz) 65.4 kg (144 lb 2.9 oz) 79.7 kg (175 lb 11.3 oz)    Exam:   General:  NAD  Cardiovascular: RRR  Respiratory: CTAB anterior lung fields.  Abdomen: Soft, nontender, nondistended, positive bowel sounds.  Musculoskeletal: No clubbing or cyanosis. 1+ bilateral lower extremity edema to hips.  Data Reviewed: Basic Metabolic Panel:  Recent Labs Lab 10/17/15 0500 10/18/15 0430 10/19/15 0500 10/20/15 0423 10/21/15 0503  NA 138 137 136 138 134*  K 3.7 3.0* 4.4 3.2* 3.8  CL 103 97* 97* 95* 93*  CO2  28 31 33* 32 33*  GLUCOSE 69 62* 93 75 62*  BUN <5* <5* 6 8 8    CREATININE 0.64 0.48 0.49 0.60 0.48  CALCIUM 7.2* 7.0* 6.8* 7.3* 7.2*  MG 1.7 2.0 1.8 1.7 2.1  PHOS 3.2 2.1* 2.5 1.9* 2.6  2.6   Liver Function Tests:  Recent Labs Lab 10/15/15 1850  10/17/15 0500 10/18/15 0430 10/19/15 0500 10/20/15 0423 10/21/15 0503  AST 18  --   --   --   --   --   --   ALT 13*  --   --   --   --   --   --   ALKPHOS 348*  --   --   --   --   --   --   BILITOT 0.6  --   --   --   --   --   --   PROT 4.1*  --   --   --   --   --   --   ALBUMIN 2.3*  < > 3.0* 2.6* 2.4* 2.4* 2.5*  < > = values in this interval not displayed. No results for input(s): LIPASE, AMYLASE in the last 168 hours. No results for input(s): AMMONIA in the last 168 hours. CBC:  Recent Labs Lab 10/15/15 1850  10/17/15 0500 10/18/15 0430 10/19/15 0500 10/20/15 0423 10/21/15 0503  WBC 12.1*  < > 11.7* 10.6* 9.5 15.2* 13.9*  NEUTROABS 10.5*  --   --   --   --   --   --   HGB 10.3*  < > 8.5* 11.8* 11.6* 11.5* 11.9*  HCT 32.7*  < > 27.3* 36.8 36.6 36.6 38.5  MCV 81.3  < > 83.2 83.6 84.5 85.9 86.3  PLT 275  < > 205 179 153 172 172  < > = values in this interval not displayed. Cardiac Enzymes: No results for input(s): CKTOTAL, CKMB, CKMBINDEX, TROPONINI in the last 168 hours. BNP (last 3 results) No results for input(s): BNP in the last 8760 hours.  ProBNP (last 3 results) No results for input(s): PROBNP in the last 8760 hours.  CBG: No results for input(s): GLUCAP in the last 168 hours.  Recent Results (from the past 240 hour(s))  C difficile quick scan w PCR reflex     Status: None   Collection Time: 10/15/15 10:26 AM  Result Value Ref Range Status   C Diff antigen NEGATIVE NEGATIVE Final   C Diff toxin NEGATIVE NEGATIVE Final   C Diff interpretation Negative for toxigenic C. difficile  Final     Studies: No results found.  Scheduled Meds: . dronabinol  2.5 mg Oral BID AC  . feeding supplement  1 Container Oral TID BM  . feeding supplement (ENSURE ENLIVE)  237 mL  Oral TID BM  . heparin subcutaneous  5,000 Units Subcutaneous 3 times per day  . ipratropium  0.5 mg Nebulization TID  . levalbuterol  0.63 mg Nebulization TID  . levothyroxine  50 mcg Oral QAC breakfast  . morphine  15 mg Oral Q12H  . nystatin  5 mL Oral QID  . predniSONE  20 mg Oral QAC breakfast  . ranitidine  150 mg Oral BID   Continuous Infusions:   Principal Problem:   C. difficile diarrhea Active Problems:   Hyperthyroidism   Metastatic melanoma (HCC)   Diarrhea   Hypokalemia   Abdominal pain   Malnutrition of moderate degree   Hypotension   Lymphocytic colitis  Lactic acidosis   Metabolic acidosis   Sinus tachycardia (HCC)   Sepsis (HCC)   C. difficile colitis   Acute encephalopathy   Acute respiratory failure with hypoxia (HCC)   Influenza A   Functional diarrhea    Time spent: 55 minutes    Synai Prettyman M.D. [facility] Pager [phone]. If 7PM-7AM, please contact night-coverage at [email or web address], password [facility] 10/21/2015, 2:31 PM  LOS: 19 days

## 2015-10-22 DIAGNOSIS — C7970 Secondary malignant neoplasm of unspecified adrenal gland: Secondary | ICD-10-CM

## 2015-10-22 DIAGNOSIS — E440 Moderate protein-calorie malnutrition: Secondary | ICD-10-CM

## 2015-10-22 DIAGNOSIS — Z9981 Dependence on supplemental oxygen: Secondary | ICD-10-CM

## 2015-10-22 LAB — RENAL FUNCTION PANEL
ANION GAP: 8 (ref 5–15)
Albumin: 2.6 g/dL — ABNORMAL LOW (ref 3.5–5.0)
BUN: 8 mg/dL (ref 6–20)
CALCIUM: 7.4 mg/dL — AB (ref 8.9–10.3)
CO2: 31 mmol/L (ref 22–32)
CREATININE: 0.46 mg/dL (ref 0.44–1.00)
Chloride: 94 mmol/L — ABNORMAL LOW (ref 101–111)
Glucose, Bld: 69 mg/dL (ref 65–99)
PHOSPHORUS: 2.6 mg/dL (ref 2.5–4.6)
Potassium: 3.6 mmol/L (ref 3.5–5.1)
SODIUM: 133 mmol/L — AB (ref 135–145)

## 2015-10-22 LAB — CBC
HCT: 40 % (ref 36.0–46.0)
HEMOGLOBIN: 12.3 g/dL (ref 12.0–15.0)
MCH: 26.3 pg (ref 26.0–34.0)
MCHC: 30.8 g/dL (ref 30.0–36.0)
MCV: 85.5 fL (ref 78.0–100.0)
PLATELETS: 172 10*3/uL (ref 150–400)
RBC: 4.68 MIL/uL (ref 3.87–5.11)
RDW: 20.3 % — ABNORMAL HIGH (ref 11.5–15.5)
WBC: 18.2 10*3/uL — AB (ref 4.0–10.5)

## 2015-10-22 LAB — MAGNESIUM: Magnesium: 1.9 mg/dL (ref 1.7–2.4)

## 2015-10-22 MED ORDER — DRONABINOL 5 MG PO CAPS: 2.5000 mg | ORAL_CAPSULE | Freq: Two times a day (BID) | ORAL | Status: AC

## 2015-10-22 MED ORDER — BOOST / RESOURCE BREEZE PO LIQD: 1.0000 | Freq: Three times a day (TID) | ORAL | Status: AC

## 2015-10-22 MED ORDER — MORPHINE SULFATE 15 MG PO TABS: 15.0000 mg | ORAL_TABLET | Freq: Four times a day (QID) | ORAL | Status: AC | PRN

## 2015-10-22 MED ORDER — ENSURE ENLIVE PO LIQD: 237.0000 mL | Freq: Three times a day (TID) | ORAL | Status: AC

## 2015-10-22 MED ORDER — LORAZEPAM 0.5 MG PO TABS: 0.5000 mg | ORAL_TABLET | Freq: Four times a day (QID) | ORAL | Status: AC | PRN

## 2015-10-22 MED ORDER — PREDNISONE 20 MG PO TABS: 20.0000 mg | ORAL_TABLET | Freq: Every day | ORAL | Status: AC

## 2015-10-22 MED ORDER — MORPHINE SULFATE ER 15 MG PO TBCR: 15.0000 mg | EXTENDED_RELEASE_TABLET | Freq: Two times a day (BID) | ORAL | Status: AC

## 2015-10-22 MED ORDER — POTASSIUM CHLORIDE CRYS ER 20 MEQ PO TBCR
40.0000 meq | EXTENDED_RELEASE_TABLET | Freq: Once | ORAL | Status: AC
  Administered 2015-10-22: 40 meq via ORAL
  Filled 2015-10-22: qty 2

## 2015-10-22 MED ORDER — LEVOTHYROXINE SODIUM 50 MCG PO TABS: 50.0000 ug | ORAL_TABLET | Freq: Every day | ORAL | Status: AC

## 2015-10-22 MED ORDER — IPRATROPIUM-ALBUTEROL 0.5-2.5 (3) MG/3ML IN SOLN: 3.0000 mL | Freq: Three times a day (TID) | RESPIRATORY_TRACT | Status: AC

## 2015-10-22 MED ORDER — HEPARIN SOD (PORK) LOCK FLUSH 100 UNIT/ML IV SOLN
500.0000 [IU] | INTRAVENOUS | Status: AC | PRN
Start: 2015-10-22 — End: 2015-10-22
  Administered 2015-10-22: 500 [IU]

## 2015-10-22 NOTE — Clinical Social Work Placement (Signed)
   CLINICAL SOCIAL WORK PLACEMENT  NOTE  Date:  10/22/2015  Patient Details  Name: Felicia Richmond MRN: [id number] Date of Birth: Oct 09, 1960  Clinical Social Work is seeking post-discharge placement for this patient at the [place] level of care (*CSW will initial, date and re-position this form in  chart as items are completed):  Yes   Patient/family provided with [place] Work Department's list of facilities offering this level of care within the geographic area requested by the patient (or if unable, by the patient's family).  Yes   Patient/family informed of their freedom to choose among providers that offer the needed level of care, that participate in Medicare, Medicaid or managed care program needed by the patient, have an available bed and are willing to accept the patient.  Yes   Patient/family informed of [facility]'s ownership interest in [facility] and [facility], as well as of the fact that they are under no obligation to receive care at these facilities.  PASRR submitted to EDS on 10/18/15     PASRR number received on 10/18/15     Existing PASRR number confirmed on       FL2 transmitted to all facilities in geographic area requested by pt/family on 10/18/15     FL2 transmitted to all facilities within larger geographic area on       Patient informed that his/her managed care company has contracts with or will negotiate with certain facilities, including the following:        Yes   Patient/family informed of bed offers received.  Patient chooses bed at [facility] and [place] recommends and patient chooses bed at      Patient to be transferred to [facility] on 10/22/15.  Patient to be transferred to facility by ambulance [facility])     Patient family notified on 10/22/15 of transfer.  Name of family member notified:  pt and pt husband notified at bedside     PHYSICIAN       Additional  Comment:    _______________________________________________ Ladell Pier, LCSW 10/22/2015, 4:35 PM

## 2015-10-22 NOTE — Discharge Summary (Signed)
Physician Discharge Summary  Felicia Richmond [id number] DOB: [birth year] DOA: 10/02/2015  PCP: Nance Pear., NP  Admit date: 10/02/2015 Discharge date: 10/22/2015  Time spent: 65 minutes  Recommendations for Outpatient Follow-up:  1. Patient be discharged to skilled facility with oxygen. Patient will need palliative care to follow-up the facility. 2. Patient is to follow-up with Dr. Marin Olp in 1 week. On follow-up patient will likely need a  comprehensive metabolic profile, and a CBC with differential and a magnesium level.   Discharge Diagnoses:  Principal Problem:   C. difficile diarrhea Active Problems:   Hyperthyroidism   Metastatic melanoma ([place])   Diarrhea   Hypokalemia   Abdominal pain   Malnutrition of moderate degree   Hypotension   Lymphocytic colitis   Lactic acidosis   Metabolic acidosis   Sinus tachycardia (HCC)   Sepsis (HCC)   C. 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 female with history of metastatic melanoma who was recently admitted 2 weeks ago for diarrhea and colonoscopy showed lymphocytic colitis and was on Entocort presented to the ER because of recurrence of diarrhea. Patient stated over the last 3 days patient has been having multiple episodes of diarrhea denied any nausea vomiting but at this time patient is also having abdominal pain diffusely. Denied any antibiotics intake except for Diflucan for oral candidiasis. Denied any fever chills chest pain shortness of breath. Patient was admitted for further management of dehydration diarrhea and  abdominal pain. Patient was also found to be hypokalemic.    Hospital Course:  #1 C. difficile colitis/sepsis/lymphocytic colitis Patient with persistent diarrhea with biopsy-proven lymphocytic colitis. Patient was on Entocort. Diarrhea worsened with combination of ipilimumab side effect as well as a C differential colitis. Patient was started on oral vancomycin the slide which still was not improvement and subsequently started on Flagyl. Patient noted to be in the criteria for sepsis secondary to lactic acidosis, hypotension, tachycardia. GI and [facility] were also consulted. Oncology is started patient on Remicade first dose 10/10/2015. Patient started on IV steroids. Patient was seen in consultation by critical care and followed during the hospitalization. Due to acidosis patient was placed on a bicarbonate drip and given the LR bolus with resolution of acidosis. Dr. Posey Pronto discussed with oncology and gastroenterology and Flagyl was discontinued. Repeat C. difficile was negative.  it was felt by GI that patient had multi-factorial diarrhea secondary to immunomodulators, C. difficile lymphocytic colitis. Patient's diarrhea improved and had resolved by day of discharge with improvement will improve consistency. Patient remained on contact precautions during the hospitalization be discharged to a skilled nursing facility in stable and improved condition.  #2 acute hypoxic respiratory failure/influenza A  patient was noted to have acute hypoxic respiratory failure on admission. Patient was admitted placed on oxygen. Sputum Gram stain and culture which was done was negative. CT chest negative for PE however did showed some moderate-sized pleural effusions left greater than right, bilateral lower lobe atelectasis, patchy opacities in both lungs greater on the left, COPD, extensive metastatic disease with continued progression. Small  amount of ascites. Small pericardial effusion. Influenza PCR was done which came  back positive for influenza A. Patient was maintained on a steroid taper subsequently transitioning to oral prednisone taper. Patient was also maintained on Tamiflu and completed a course of Tamiflu. Nebulizer treatments were done patient improved clinically and patient be discharged to a skilled nursing facility in stable and improved condition. Patient will be discharged on oxygen as it was felt that worsening metastatic disease may have been contributed to acute hypoxic respiratory failure. Outpatient follow-up.   #3 urinary retention  During the hospitalization patient was initially retaining urine in the Foley catheter was placed. Patient was monitored. Foley catheter was removed patient had a voiding trial and was successful.  #4 metastatic melanoma Pleural fluid cytology negative. Per oncology. Patient with metastatic disease and likely with a poor prognosis. Palliative care has consulted recommending discharged to SNF with palliative care following.   #5 failure to thrive No further nausea or vomiting. Some improvement with oral intake. Patient was maintained on home regimen of Marinol as well as nutritional supplementation.   #6 hypothyroidism/Hx hyperthroidism Patient noted to have a significantly elevated TSH of 15.32 and undetectable free T4 levels. Methimazole has been discontinued. Continue current dose Synthroid. Will likely need outpatient follow-up.  #7 oral thrush Status post 1 week fluconazole. Nystatin. Resolved.  #8 metabolic acidosis Resolved.   Procedures:  2 units packed red blood cells 10/17/2015  CT angiogram chest 10/15/2015  CT abdomen and pelvis 10/03/2015  Chest x-ray 10/05/2015, 10/11/2015, 10/18/2015  Abdominal x-ray 10/11/2015, 10/12/2015  Thoracentesis 10/05/2015    Consultations:  Oncology: FeliciaEnnever 10/03/2015  Gastroenterology: Dr. Amedeo Plenty 10/08/2015  PCCM: Dr. Ashok Cordia 10/11/2015  Palliative care Dr. Hilma Favors 10/18/2015    Discharge  Exam: Filed Vitals:   10/21/15 2152 10/22/15 0603  BP: 121/67 142/83  Pulse: 95 87  Temp: 97.6 F (36.4 C) 97.4 F (36.3 C)  Resp: 18 20    General: NAD Cardiovascular: RRR Respiratory: CTAB  Discharge Instructions   Discharge Instructions    Diet general    Complete by:  As directed      Discharge instructions    Complete by:  As directed   Follow up with Dr Marin Olp in 1 weeks, or as scheduled.     Increase activity slowly    Complete by:  As directed           Current Discharge Medication List    START taking these medications   Details  !! feeding supplement (BOOST / RESOURCE BREEZE) LIQD Take 1 Container by mouth 3 (three) times daily between meals. Refills: 0    !! feeding supplement, ENSURE ENLIVE, (ENSURE ENLIVE) LIQD Take 237 mLs by mouth 3 (three) times daily between meals. Qty: 237 mL, Refills: 12    ipratropium-albuterol (DUONEB) 0.5-2.5 (3) MG/3ML SOLN Take 3 mLs by nebulization 3 (three) times daily. Qty: 360 mL, Refills: 0    levothyroxine (SYNTHROID, LEVOTHROID) 50 MCG tablet Take 1 tablet (50 mcg total) by mouth daily before breakfast. Qty: 30 tablet, Refills: 0     !! - Potential duplicate medications found. Please discuss with provider.    CONTINUE these medications which have CHANGED   Details  dronabinol (MARINOL) 5 MG capsule Take 1 capsule (5 mg total) by mouth 2 (two) times daily before a meal. Qty: 20 capsule, Refills: 0   Associated Diagnoses: Metastatic melanoma ([place]); Malignant cachexia (HCC)    LORazepam (ATIVAN) 0.5 MG tablet Take 1 tablet (0.5 mg total) by  mouth every 6 (six) hours as needed (Nausea or vomiting). Qty: 20 tablet, Refills: 0   Associated Diagnoses: Metastatic melanoma ([place])    morphine (MS CONTIN) 15 MG 12 hr tablet Take 1 tablet (15 mg total) by mouth every 12 (twelve) hours. Qty: 60 tablet, Refills: 0    morphine (MSIR) 15 MG tablet Take 1 tablet (15 mg total) by mouth every 6 (six) hours as needed for severe  pain. Qty: 20 tablet, Refills: 0   Associated Diagnoses: Metastatic melanoma ([place]); Cellulitis of breast    predniSONE (DELTASONE) 20 MG tablet Take 1 tablet (20 mg total) by mouth daily before breakfast. Take for 2 days, then stop. Qty: 2 tablet, Refills: 0      CONTINUE these medications which have NOT CHANGED   Details  acetaminophen (TYLENOL) 500 MG tablet Take 1,000 mg by mouth every 6 (six) hours as needed for moderate pain or headache.    famotidine (PEPCID) 20 MG tablet Take 20 mg by mouth daily.    lidocaine-prilocaine (EMLA) cream Apply to affected area once Qty: 30 g, Refills: 3   Associated Diagnoses: Metastatic melanoma ([place])    loperamide (IMODIUM A-D) 2 MG tablet Take 4 mg by mouth as needed for diarrhea or loose stools.    Meclizine HCl 25 MG CHEW Chew 1 tablet (25 mg total) by mouth daily as needed (dizziness). Qty: 90 each, Refills: 0   Associated Diagnoses: Metastatic melanoma ([place])      STOP taking these medications     budesonide (ENTOCORT EC) 3 MG 24 hr capsule      fluconazole (DIFLUCAN) 100 MG tablet      Ipilimumab (YERVOY IV)      methimazole (TAPAZOLE) 5 MG tablet      dabrafenib mesylate (TAFINLAR) 75 MG capsule      trametinib dimethyl sulfoxide (MEKINIST) 2 MG tablet        Allergies  Allergen Reactions  . Cotellic [Cobimetinib] Diarrhea  . Oxycodone Nausea And Vomiting  . Pseudoephedrine Other (See Comments)    Makes patient feel weird  . Scopolamine Other (See Comments)  . Tramadol     Other reaction(s): GI Upset (intolerance)   Follow-up Information    Follow up with Volanda Napoleon, MD. Schedule an appointment as soon as possible for a visit in 1 week.   Specialty:  Oncology   Contact information:   [address], [address] [place] [address] [phone]        The results of significant diagnostics from this hospitalization (including imaging, microbiology, ancillary and laboratory) are listed below for  reference.    Significant Diagnostic Studies: Dg Chest 1 View  10/05/2015  CLINICAL DATA:  Status post thoracentesis on the right EXAM: CHEST 1 VIEW COMPARISON:  September 18, 2015 ; CT abdomen and pelvis including lung bases October 03, 2015 ; chest CT July 27, 2015 FINDINGS: There is no appreciable pneumothorax. Right effusion is smaller post thoracentesis. There is a small residual right pleural effusion with right base atelectatic change. Lungs elsewhere clear. Heart size and pulmonary vascularity are normal. There is soft tissue fullness overlying the right region, felt to represent adenopathy. Recent CT demonstrated anterior mediastinal adenopathy toward the right. No bone lesions. Port-A-Cath tip is near the cavoatrial junction. IMPRESSION: No demonstrable pneumothorax. Small right effusion with right base atelectasis/ consolidation. Stable adenopathy overlying the right hilum, demonstrated anteriorly on CT scan 2 months prior. Left lung clear. Electronically Signed   By: Lowella Grip III  M.D.   On: 10/05/2015 12:37   Dg Chest 2 View  10/18/2015  CLINICAL DATA:  55 year old female with shortness of breath. EXAM: CHEST  2 VIEW COMPARISON:  10/15/2015 chest CT and prior exams. FINDINGS: Cardiomediastinal silhouette is unchanged with right hilar adenopathy again noted. The right central venous catheter is present with tip overlying the superior cavoatrial junction. Bilateral pleural effusions have decreased, now small and right greater than left. Bibasilar atelectasis is identified. There is no evidence of pneumothorax. IMPRESSION: Decreased pleural effusions, now small, right greater than left. Mild basilar atelectasis. Unchanged right mediastinal adenopathy. Electronically Signed   By: Margarette Canada M.D.   On: 10/18/2015 10:38   Ct Angio Chest Pe W/cm &/or Wo Cm  10/15/2015  CLINICAL DATA:  Shortness of breath. Hypoxia. Stage IV metastatic melanoma. EXAM: CT ANGIOGRAPHY CHEST WITH CONTRAST  TECHNIQUE: Multidetector CT imaging of the chest was performed using the standard protocol during bolus administration of intravenous contrast. Multiplanar CT image reconstructions and MIPs were obtained to evaluate the vascular anatomy. CONTRAST:  189mL OMNIPAQUE IOHEXOL 350 MG/ML SOLN COMPARISON:  Portable chest dated 10/11/2015. Abdomen and pelvis CT dated 10/03/2015. Chest CTA dated 07/27/2015. FINDINGS: Mediastinum/Lymph Nodes: Significant increase in size of a large right anterior mediastinal mass. This previously measured 4.3 cm in maximum diameter and currently measures 7.1 cm in maximum corresponding diameter on image number 38. A previously demonstrated 1.5 cm right hilar node continues to measure 1.5 cm in maximum corresponding diameter on image number 41. A previously demonstrated 1.8 cm left hilar node currently measures 1.7 cm in corresponding diameter on image number 42. A previously demonstrated 4.9 cm left axillary node measures 5.8 cm in corresponding diameter on image number 23. A previously demonstrated 1.0 cm short axis right axillary node currently measures 9 mm on image number 33. A 6 mm short axis epicardial lymph node on image number 69 is not changed significantly. The previously seen large right breast mass is not included in its entirety, currently measuring greater than 6.5 cm in maximum diameter on image number 60, previously measuring 5.3 cm in maximum diameter. Interval bilateral subcutaneous edema. Normally opacified pulmonary arteries with no pulmonary arterial filling defects seen. Lungs/Pleura: Moderate-sized right pleural effusion with mild progression since 10/03/2015 moderate-sized left pleural effusion, significantly increased. Bilateral lower lobe atelectasis. Small pericardial effusion with a maximum thickness of 8 mm. Interval mild patchy opacities in the left upper lobe and left lower lobe. Minimal similar changes in the right upper lobe and right middle lobe. Mild  bilateral bullous changes. Upper abdomen: Huge, heterogeneous bilateral adrenal masses are again demonstrated. The mass on the right measures 11.8 cm in maximum diameter on image number 92 in the mass on the left measures 11.5 cm in maximum diameter on image number 91, both unchanged. Small amount of free peritoneal fluid. Musculoskeletal: No chest wall mass or suspicious bone lesions identified. Review of the MIP images confirms the above findings. IMPRESSION: 1. No pulmonary emboli. 2. Moderate-sized bilateral pleural effusions, significantly increased on the left. 3. Bilateral lower lobe atelectasis. 4. Patchy opacities in both lungs, greater on the left. These most compatible with an infectious or inflammatory process. 5. COPD. 6. Extensive metastatic disease with continued progression, as described above. 7. Small amount of ascites. 8. Small pericardial effusion. Electronically Signed   By: Claudie Revering M.D.   On: 10/15/2015 16:25   Ct Abdomen Pelvis W Contrast  10/03/2015  CLINICAL DATA:  Generalized abdominal pain for 5 days, known history  of metastatic melanoma EXAM: CT ABDOMEN AND PELVIS WITH CONTRAST TECHNIQUE: Multidetector CT imaging of the abdomen and pelvis was performed using the standard protocol following bolus administration of intravenous contrast. CONTRAST:  138mL OMNIPAQUE IOHEXOL 300 MG/ML  SOLN COMPARISON:  07/27/2015 FINDINGS: The lung bases demonstrate large right-sided pleural effusion and small left-sided pleural effusion. Right lower lobe consolidation is noted. In the right breast there is again noted a lobulated peripherally enhancing mass lesion which has grown in the interval from the prior exam. It now measures 7.3 x 5.5 cm. It previously measured 5.3 cm in greatest dimension. The liver is within normal limits. An area of focal fatty sparing is seen. The gallbladder is been surgically removed. Large right-sided adrenal mass lesion is again identified with peripheral enhancement. It  now measures 12 cm in greatest dimension which is in increased from 8.1 cm on the prior exam. The left adrenal lesion now measures 11.1 cm also increased from previous measuring 9.2 cm. The spleen and pancreas are within normal limits. The metastatic lesion in the mesenteric root now measures 6 cm best seen on image number 34 series 2. This is stable from the prior exam. A pericolonic implant is noted adjacent to the proximal transverse colon measuring 3.4 cm. This is also roughly stable from the prior exam. A few scattered small peritoneal implants are noted which are slightly larger than that seen on the prior exam. No obstructive changes are noted within the bowel. The uterus and bladder appear within normal limits. Mild free fluid is noted within the abdomen and pelvis. Diffuse subcutaneous edema is noted in the abdominal wall consistent with anasarca. Mild compression upon the proximal abdominal aorta is noted secondary to the adrenal masses. A metastatic deposit is again noted in the left rectus muscle stable from the previous exam. No bony metastatic lesions are seen. IMPRESSION: Progression in the degree of diffuse metastatic disease consistent with the patient's given clinical history. Enlargement of bilateral adrenal masses as well as a right breast mass and scattered peritoneal implants is noted. New third spacing of fluid is noted within the abdominal wall as well as new mild free fluid within the abdomen and pelvis. No other acute abnormality is noted Electronically Signed   By: Inez Catalina M.D.   On: 10/03/2015 13:47   Dg Chest Port 1 View  10/11/2015  CLINICAL DATA:  Sob,  Hx of emphysema EXAM: PORTABLE CHEST 1 VIEW COMPARISON:  10/05/2015 FINDINGS: Right-sided power port tip to level of the superior vena cava. Heart size is normal. Anterior mediastinal adenopathy again noted. There are small bilateral pleural effusions, right greater than left. There has been some improvement in lung aeration.  IMPRESSION: 1. Bilateral effusions. 2. Slightly improved aeration. 3. Stable appearance of adenopathy. Electronically Signed   By: Nolon Nations M.D.   On: 10/11/2015 08:47   Dg Abd Portable 1v  10/12/2015  CLINICAL DATA:  Nasogastric tube placement EXAM: PORTABLE ABDOMEN - 1 VIEW COMPARISON:  October 11, 2015 FINDINGS: Nasogastric tube tip and side port are in the stomach. The visualized bowel gas pattern is unremarkable. Visualized lung bases are clear. IMPRESSION: Nasogastric tube tip and side port in stomach. Visualized bowel gas pattern unremarkable. Electronically Signed   By: Lowella Grip III M.D.   On: 10/12/2015 07:58   Dg Abd Portable 1v  10/11/2015  CLINICAL DATA:  Abdominal pain, hypertension, emphysema, metastatic melanoma EXAM: PORTABLE ABDOMEN - 1 VIEW COMPARISON:  CT abdomen and pelvis 10/03/2015 FINDINGS: Nonobstructive bowel  gas pattern. Upper normal caliber transverse colon with mild wall thickening again identified. Scattered air-filled nondistended loops of bowel throughout remainder of abdomen. No definite evidence of bowel obstruction. Osseous structures unremarkable. No urinary tract calcification. Surgical clips RIGHT upper quadrant from cholecystectomy. IMPRESSION: Upper normal caliber transverse colon with mild wall thickening raising question of colitis. No evidence of bowel obstruction. Electronically Signed   By: Lavonia Dana M.D.   On: 10/11/2015 08:46   US Thoracentesis Asp Pleural Space W/img Guide  10/05/2015  INDICATION: Right pleural effusion. History of metastatic melanoma. Request diagnostic and therapeutic thoracentesis EXAM: ULTRASOUND GUIDED RIGHT THORACENTESIS MEDICATIONS: None. COMPLICATIONS: None immediate. PROCEDURE: An ultrasound guided thoracentesis was thoroughly discussed with the patient and questions answered. The benefits, risks, alternatives and complications were also discussed. The patient understands and wishes to proceed with the procedure.  Written consent was obtained. Ultrasound was performed to localize and mark an adequate pocket of fluid in the right chest. The area was then prepped and draped in the normal sterile fashion. 1% Lidocaine was used for local anesthesia. Under ultrasound guidance a Safe-T-Centesis catheter was introduced. Thoracentesis was performed. The catheter was removed and a dressing applied. FINDINGS: A total of approximately 500 mL of clear yellow fluid was removed. Samples were sent to the laboratory as requested by the clinical team. IMPRESSION: Successful ultrasound guided right thoracentesis yielding 500 mL of pleural fluid. Read by: Ascencion Dike PA-C Electronically Signed   By: Aletta Edouard M.D.   On: 10/05/2015 12:52    Microbiology: Recent Results (from the past 240 hour(s))  C difficile quick scan w PCR reflex     Status: None   Collection Time: 10/15/15 10:26 AM  Result Value Ref Range Status   C Diff antigen NEGATIVE NEGATIVE Final   C Diff toxin NEGATIVE NEGATIVE Final   C Diff interpretation Negative for toxigenic C. difficile  Final     Labs: Basic Metabolic Panel:  Recent Labs Lab 10/18/15 0430 10/19/15 0500 10/20/15 0423 10/21/15 0503 10/22/15 0621  NA 137 136 138 134* 133*  K 3.0* 4.4 3.2* 3.8 3.6  CL 97* 97* 95* 93* 94*  CO2 31 33* 32 33* 31  GLUCOSE 62* 93 75 62* 69  BUN <5* 6 8 8 8   CREATININE 0.48 0.49 0.60 0.48 0.46  CALCIUM 7.0* 6.8* 7.3* 7.2* 7.4*  MG 2.0 1.8 1.7 2.1 1.9  PHOS 2.1* 2.5 1.9* 2.6  2.6 2.6   Liver Function Tests:  Recent Labs Lab 10/15/15 1850  10/18/15 0430 10/19/15 0500 10/20/15 0423 10/21/15 0503 10/22/15 0621  AST 18  --   --   --   --   --   --   ALT 13*  --   --   --   --   --   --   ALKPHOS 348*  --   --   --   --   --   --   BILITOT 0.6  --   --   --   --   --   --   PROT 4.1*  --   --   --   --   --   --   ALBUMIN 2.3*  < > 2.6* 2.4* 2.4* 2.5* 2.6*  < > = values in this interval not displayed. No results for input(s): LIPASE,  AMYLASE in the last 168 hours. No results for input(s): AMMONIA in the last 168 hours. CBC:  Recent Labs Lab 10/15/15 1850  10/18/15 0430 10/19/15  0500 10/20/15 0423 10/21/15 0503 10/22/15 0621  WBC 12.1*  < > 10.6* 9.5 15.2* 13.9* 18.2*  NEUTROABS 10.5*  --   --   --   --   --   --   HGB 10.3*  < > 11.8* 11.6* 11.5* 11.9* 12.3  HCT 32.7*  < > 36.8 36.6 36.6 38.5 40.0  MCV 81.3  < > 83.6 84.5 85.9 86.3 85.5  PLT 275  < > 179 153 172 172 172  < > = values in this interval not displayed. Cardiac Enzymes: No results for input(s): CKTOTAL, CKMB, CKMBINDEX, TROPONINI in the last 168 hours. BNP: BNP (last 3 results) No results for input(s): BNP in the last 8760 hours.  ProBNP (last 3 results) No results for input(s): PROBNP in the last 8760 hours.  CBG: No results for input(s): GLUCAP in the last 168 hours.     SignedIrine Seal MD.  [facility] 10/22/2015, 2:43 PM

## 2015-10-22 NOTE — Progress Notes (Signed)
Physical Therapy Treatment Patient Details Name: Felicia Richmond MRN: [id number] DOB: July 07, 1961 Today's Date: 10/22/2015    History of Present Illness 56 yo female admitted with C diff diarrhea. hx of cancer (met melanoma), emphysema, Meniere's disease, HTN, CVA,     PT Comments    Progressing slowly with mobility. Remained on [place] O2 during session. Pt able to stand and pivot to recliner on today. Continue to recommend SNF  Follow Up Recommendations  SNF     Equipment Recommendations  Rolling walker with 5" wheels (youth height)    Recommendations for Other Services       Precautions / Restrictions Precautions Precautions: Fall Restrictions Weight Bearing Restrictions: No    Mobility  Bed Mobility Overal bed mobility: Needs Assistance Bed Mobility: Supine to Sit     Supine to sit: HOB elevated;Min guard     General bed mobility comments: close guard for safety. increased time and moderate reliance on bedrail.   Transfers Overall transfer level: Needs assistance Equipment used: Rolling walker (2 wheeled) Transfers: Sit to/from [facility] Sit to Stand: Min assist;From elevated surface Stand pivot transfers: Min assist       General transfer comment: Assist to rise, stabilize, control descent. VCs safety, hand placement. Sit to stand x3 with RW. Stand pivot from bed to recliner with RW.   Ambulation/Gait                 Stairs            Wheelchair Mobility    Modified Rankin (Stroke Patients Only)       Balance Overall balance assessment: Needs assistance Sitting-balance support: Bilateral upper extremity supported;Feet supported Sitting balance-Leahy Scale: Good     Standing balance support: Bilateral upper extremity supported;During functional activity Standing balance-Leahy Scale: Poor Standing balance comment: requires RW for support                    Cognition Arousal/Alertness: Awake/alert Behavior During  Therapy: WFL for tasks assessed/performed Overall Cognitive Status: Within Functional Limits for tasks assessed                      Exercises General Exercises - Lower Extremity Ankle Circles/Pumps: AROM;Both;10 reps;Supine Quad Sets: AROM;Both;10 reps;Supine Long Arc Quad: AROM;Both;10 reps;Seated    General Comments        Pertinent Vitals/Pain Pain Assessment: Faces Faces Pain Scale: Hurts even more Pain Location: back pain Pain Descriptors / Indicators: Sore Pain Intervention(s): Premedicated before session;Repositioned    Home Living                      Prior Function            PT Goals (current goals can now be found in the care plan section) Progress towards PT goals: Progressing toward goals (slowly)    Frequency  Min 3X/week    PT Plan Current plan remains appropriate    Co-evaluation             End of Session   Activity Tolerance: Patient limited by fatigue;Patient limited by pain Patient left: in chair;with call bell/phone within reach     Time: 1062-6948 PT Time Calculation (min) (ACUTE ONLY): 15 min  Charges:  $Therapeutic Activity: 8-22 mins                    G Codes:      Weston Anna, MPT Pager: [phone]

## 2015-10-22 NOTE — Progress Notes (Signed)
Felicia Richmond is about the same. She had no new issues over the weekend as far as I can tell. She still has the oxygen going.  She is having some pain in the left flank. I suspect that this probably is from her underlying malignancy with the adrenal met.  She's does not state any diarrhea. Her appetite might be a low bit better. She is still incredibly deconditioned.  She says that she is going to go to a rehabilitation facility in [place]. This would be fantastically as this is where she lives. It would make life a lot easier for she and her family.  She's had no vomiting.  She's had no fever. She's had no urination issues.  Her labs show her white cell count of 18.2. Hemoglobin 12.3 and platelet count 172,000. Her albumin is 2.6. Creatinine is normal. Calcium is 7.4.  On her physical exam, her vital signs are all stable. Her blood pressure is 142/83. Her temperature is 97.4. Pulse is 87. Head and neck exam shows no adenopathy. No oral lesions are noted. Lungs are clear bilaterally. She has good air movement bilaterally. Cardiac exam regular in rhythm with no murmurs, rubs or bruits. Abdomen is soft. There is some tenderness over the left side. No obvious abdominal masses noted. She has decent bowel sounds. Extremities shows some 1+ edema in her legs.  For now, there is not much else that we really need to do with her as inpatient.  It will all be about her nutritional state.  If she is discharged today, then I will follow-up with her as now patient within a week or so.  As always, the care that she's gotten up on 5 E. has been outstanding!!!  [place] 34:8

## 2015-10-22 NOTE — Progress Notes (Signed)
Pt discharged to [facility].   CSW confirmed with [facility] that [facility] authorization received.   CSW facilitated pt discharge needs including contacting facility, faxing pt discharge information to facility, providing RN phone number to call report, notifying pt and pt spouse, and arranging ambulance transport via [place].  No further social work needs identified at this time.  CSW signing off.   Alison Murray, MSW, [facility] [phone]

## 2015-10-22 NOTE — Progress Notes (Signed)
Gave report to [employer], Therapist, sports at [facility]. Left number if she had additional questions/concerns.

## 2015-10-22 NOTE — Progress Notes (Signed)
   10/22/15 1500  Clinical Encounter Type  Visited With Patient  Visit Type Initial  Referral From Chaplain  Spiritual Encounters  Spiritual Needs Emotional (Blessing before d/c)  Felicia Richmond visited briefly to bless and reach out prior to D/C; pt receptive and readily waiting for transport.  3:59 PM [name]

## 2015-10-23 ENCOUNTER — Encounter: Payer: Self-pay | Admitting: *Deleted

## 2015-10-23 ENCOUNTER — Other Ambulatory Visit: Payer: Self-pay | Admitting: Hematology & Oncology

## 2015-10-23 ENCOUNTER — Other Ambulatory Visit: Payer: BLUE CROSS/BLUE SHIELD

## 2015-10-23 ENCOUNTER — Ambulatory Visit: Payer: BLUE CROSS/BLUE SHIELD | Admitting: Family

## 2015-10-23 ENCOUNTER — Telehealth: Payer: Self-pay | Admitting: Family

## 2015-10-23 ENCOUNTER — Ambulatory Visit: Payer: BLUE CROSS/BLUE SHIELD | Admitting: Hematology & Oncology

## 2015-10-23 ENCOUNTER — Ambulatory Visit: Payer: BLUE CROSS/BLUE SHIELD

## 2015-10-23 DIAGNOSIS — C799 Secondary malignant neoplasm of unspecified site: Secondary | ICD-10-CM

## 2015-10-23 DIAGNOSIS — C439 Malignant melanoma of skin, unspecified: Secondary | ICD-10-CM

## 2015-10-24 NOTE — Telephone Encounter (Signed)
Pt was no show 10/23/15 9:45am, 1st no show I see, pt has not rescheduled, charge or no charge?

## 2015-10-24 NOTE — Telephone Encounter (Signed)
No charge. 

## 2015-10-25 ENCOUNTER — Encounter: Payer: Self-pay | Admitting: *Deleted

## 2015-10-26 ENCOUNTER — Ambulatory Visit: Payer: BLUE CROSS/BLUE SHIELD | Admitting: Family

## 2015-10-26 ENCOUNTER — Ambulatory Visit: Payer: BLUE CROSS/BLUE SHIELD

## 2015-10-26 ENCOUNTER — Other Ambulatory Visit: Payer: BLUE CROSS/BLUE SHIELD

## 2015-10-30 ENCOUNTER — Other Ambulatory Visit: Payer: Self-pay | Admitting: Nurse Practitioner

## 2015-10-31 ENCOUNTER — Ambulatory Visit: Payer: BLUE CROSS/BLUE SHIELD

## 2015-10-31 ENCOUNTER — Ambulatory Visit: Payer: BLUE CROSS/BLUE SHIELD | Admitting: Hematology & Oncology

## 2015-10-31 ENCOUNTER — Other Ambulatory Visit: Payer: BLUE CROSS/BLUE SHIELD

## 2015-11-08 ENCOUNTER — Ambulatory Visit: Payer: BLUE CROSS/BLUE SHIELD | Admitting: Hematology & Oncology

## 2015-11-08 ENCOUNTER — Other Ambulatory Visit: Payer: BLUE CROSS/BLUE SHIELD

## 2015-11-08 ENCOUNTER — Ambulatory Visit: Payer: BLUE CROSS/BLUE SHIELD

## 2015-11-24 DEATH — deceased

## 2015-12-14 ENCOUNTER — Encounter: Payer: Self-pay | Admitting: Hematology & Oncology

## 2016-01-15 ENCOUNTER — Encounter: Payer: Self-pay | Admitting: *Deleted

## 2016-01-15 NOTE — Telephone Encounter (Signed)
Opened in error

## 2016-01-29 ENCOUNTER — Encounter: Payer: Self-pay | Admitting: *Deleted

## 2017-02-07 IMAGING — DX DG ABDOMEN 2V
3 series · 3 of 3 positions shown · non-contrast
Comparison: None.

CLINICAL DATA: Nausea and vomiting.

EXAM:
ABDOMEN - 2 VIEW

[abdomen erect]
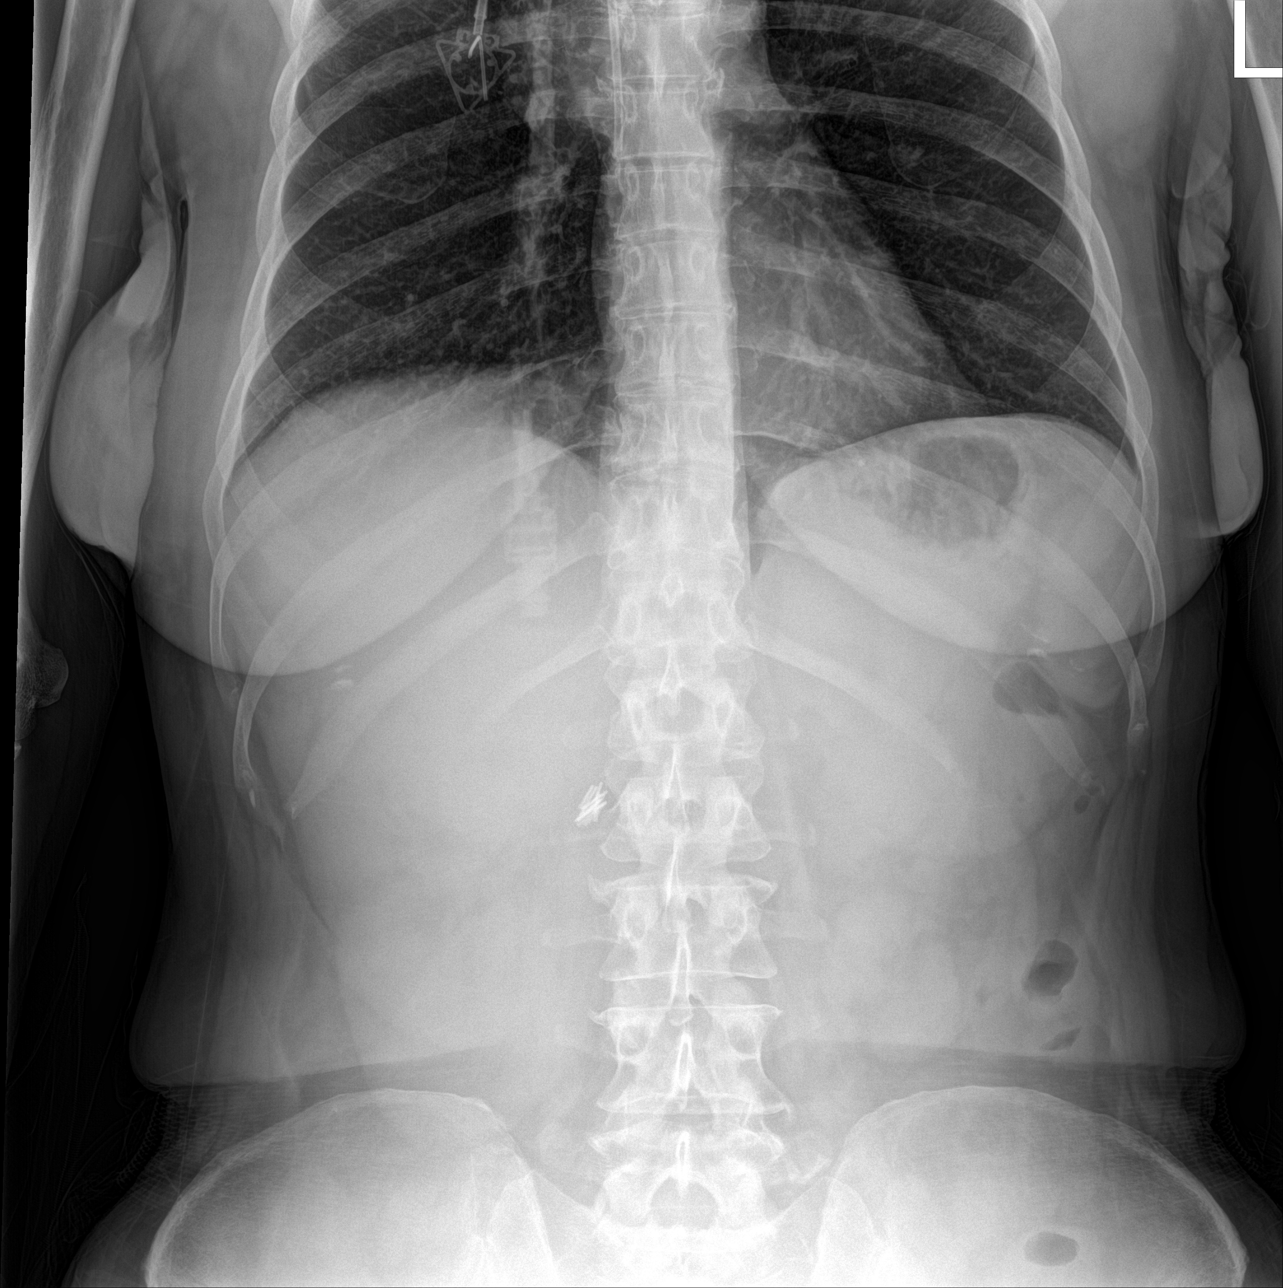

[abdomen supine (1 of 2)]
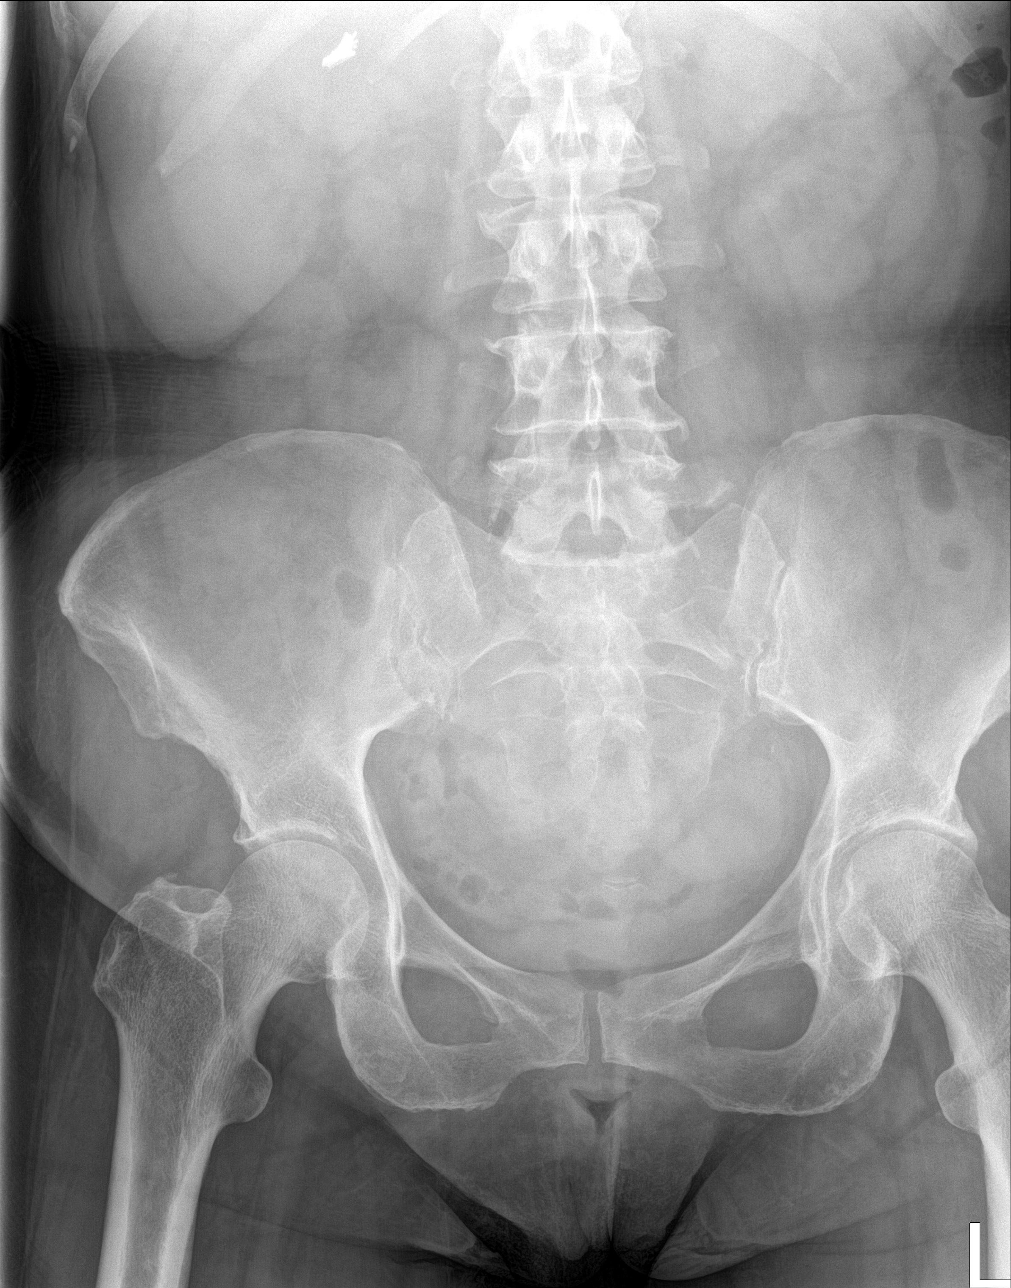

[abdomen supine (2 of 2)]
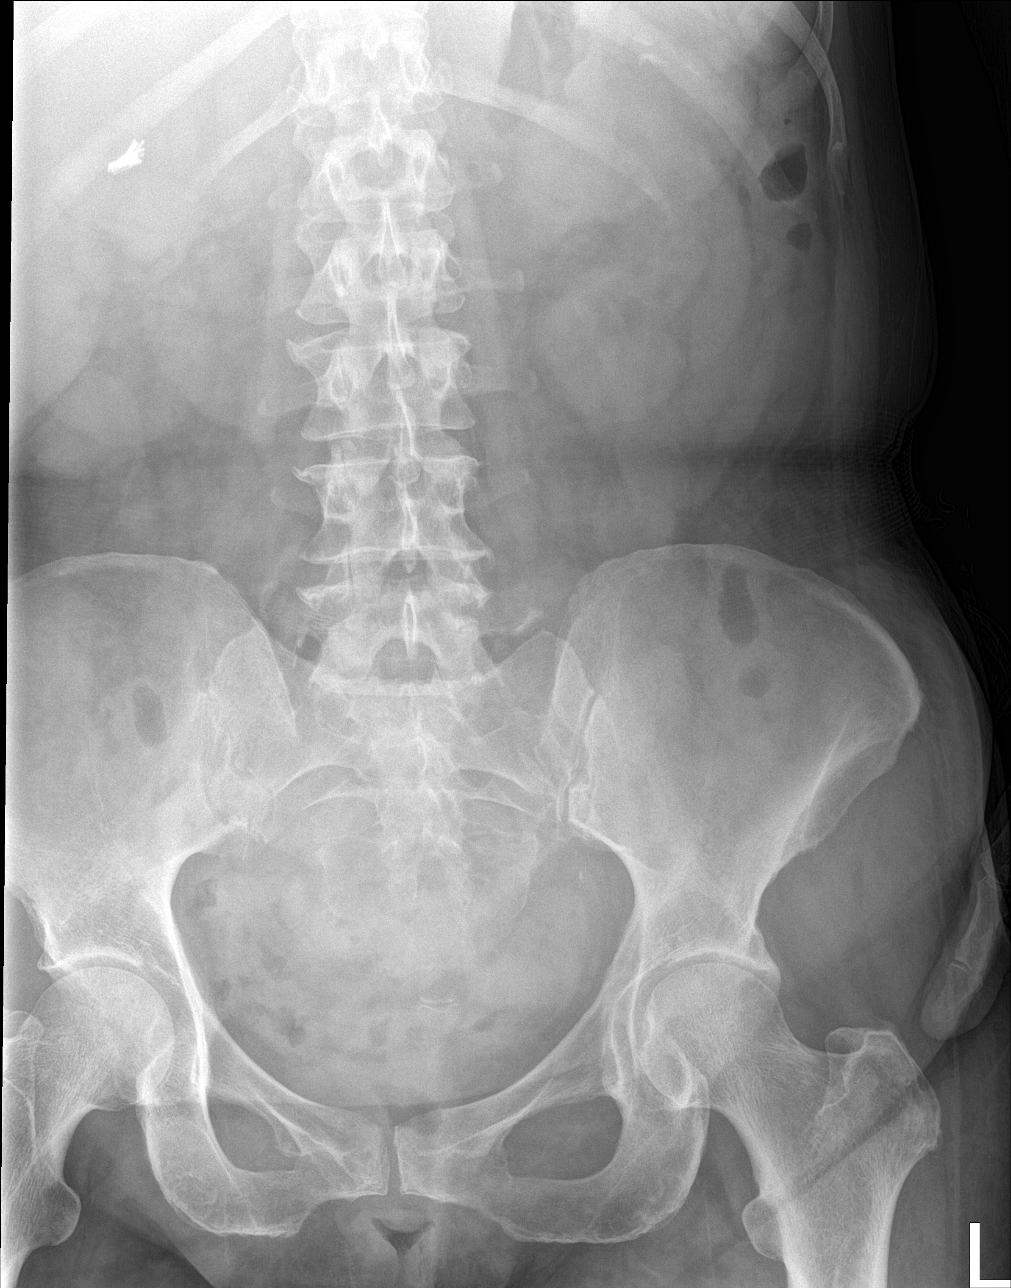

[3 of 3 positions shown; findings below may reference images not displayed]

FINDINGS: The bowel gas pattern is normal. There is no evidence of free air.
Status post cholecystectomy. No radio-opaque calculi or other
significant radiographic abnormality is seen.
IMPRESSION: No evidence of bowel obstruction or ileus.

## 2017-03-09 IMAGING — DX DG ABD PORTABLE 1V
1 series · 1 of 1 positions shown · non-contrast
Comparison: CT abdomen and pelvis 10/03/2015

CLINICAL DATA: Abdominal pain, hypertension, emphysema, metastatic
melanoma

EXAM:
PORTABLE ABDOMEN - 1 VIEW

[abdomen kub]
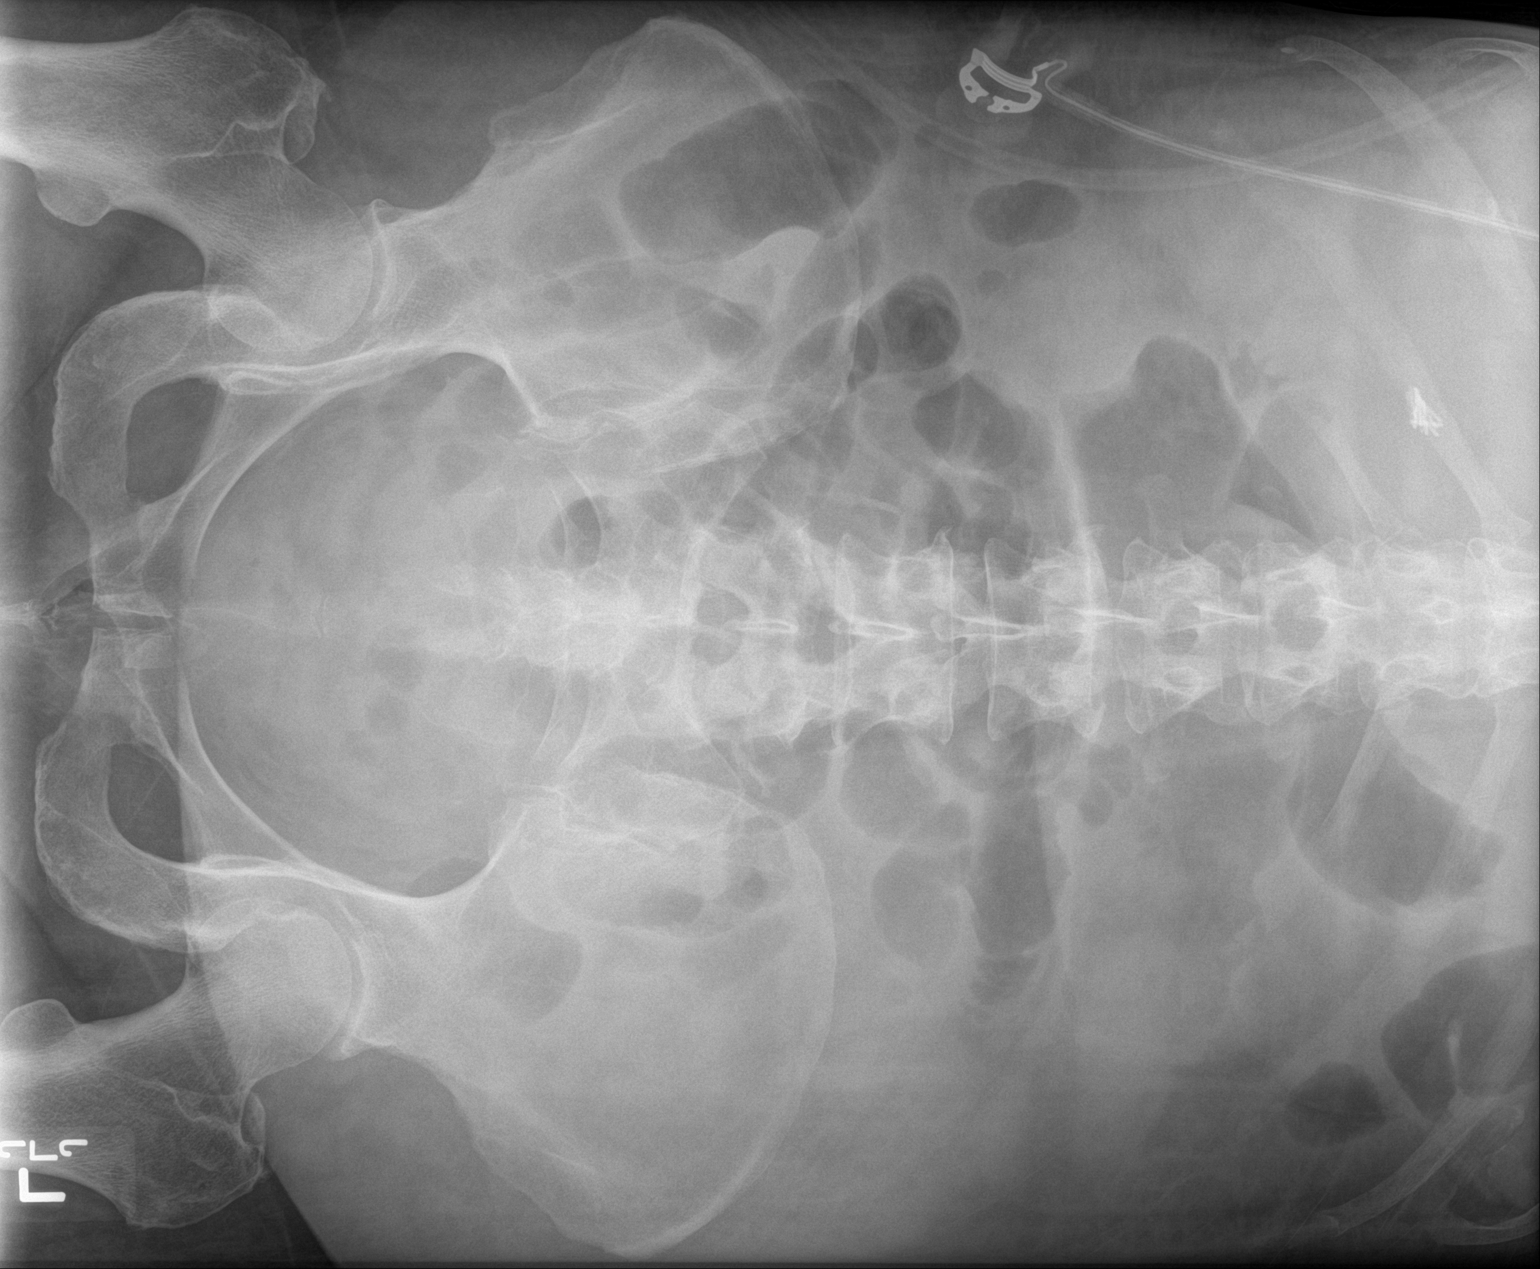

[1 of 1 positions shown; findings below may reference images not displayed]

FINDINGS: Nonobstructive bowel gas pattern.

Upper normal caliber transverse colon with mild wall thickening
again identified.

Scattered air-filled nondistended loops of bowel throughout
remainder of abdomen.

No definite evidence of bowel obstruction.

Osseous structures unremarkable.

No urinary tract calcification.

Surgical clips RIGHT upper quadrant from cholecystectomy.
IMPRESSION: Upper normal caliber transverse colon with mild wall thickening
raising question of colitis.

No evidence of bowel obstruction.

## 2017-03-09 IMAGING — DX DG CHEST 1V PORT
1 series · 1 of 1 positions shown · non-contrast
Comparison: 10/05/2015

CLINICAL DATA: Sob,  Hx of emphysema

EXAM:
PORTABLE CHEST 1 VIEW

[chest ap]
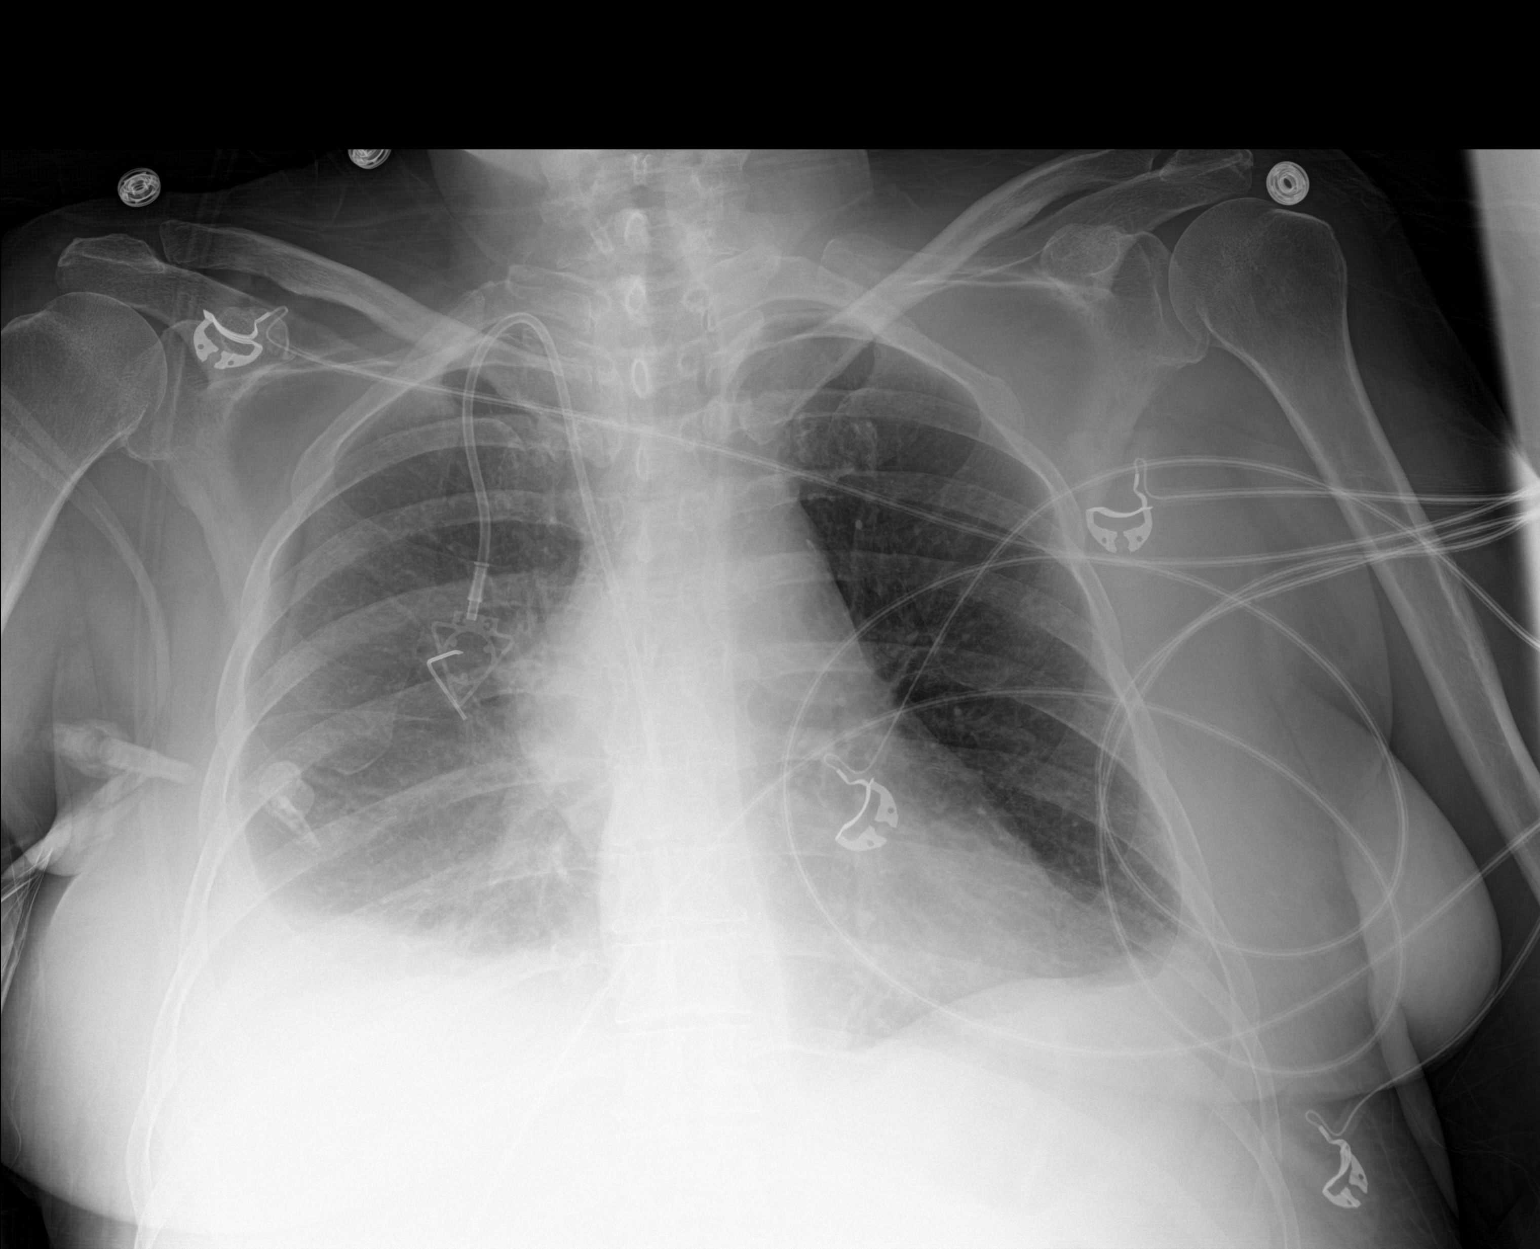

[1 of 1 positions shown; findings below may reference images not displayed]

FINDINGS: Right-sided power port tip to level of the superior vena cava.

Heart size is normal. Anterior mediastinal adenopathy again noted.
There are small bilateral pleural effusions, right greater than
left. There has been some improvement in lung aeration.
IMPRESSION: 1. Bilateral effusions.
2. Slightly improved aeration.
3. Stable appearance of adenopathy.
